# Patient Record
Sex: Male | Born: 1950
Health system: Southern US, Community
[De-identification: ages and names within clinical notes are randomized; demographics above are authoritative.]

## PROBLEM LIST (undated history)

## (undated) DIAGNOSIS — I499 Cardiac arrhythmia, unspecified: Secondary | ICD-10-CM

## (undated) DIAGNOSIS — M1990 Unspecified osteoarthritis, unspecified site: Secondary | ICD-10-CM

## (undated) DIAGNOSIS — I10 Essential (primary) hypertension: Secondary | ICD-10-CM

## (undated) DIAGNOSIS — Z8489 Family history of other specified conditions: Secondary | ICD-10-CM

## (undated) DIAGNOSIS — F419 Anxiety disorder, unspecified: Secondary | ICD-10-CM

## (undated) DIAGNOSIS — E119 Type 2 diabetes mellitus without complications: Secondary | ICD-10-CM

## (undated) DIAGNOSIS — N189 Chronic kidney disease, unspecified: Secondary | ICD-10-CM

## (undated) DIAGNOSIS — J189 Pneumonia, unspecified organism: Secondary | ICD-10-CM

## (undated) DIAGNOSIS — T8859XA Other complications of anesthesia, initial encounter: Secondary | ICD-10-CM

## (undated) DIAGNOSIS — F32A Depression, unspecified: Secondary | ICD-10-CM

## (undated) DIAGNOSIS — G709 Myoneural disorder, unspecified: Secondary | ICD-10-CM

## (undated) DIAGNOSIS — G4730 Sleep apnea, unspecified: Secondary | ICD-10-CM

## (undated) HISTORY — PX: TONSILLECTOMY: SUR1361

## (undated) HISTORY — PX: HIP PINNING: SHX1757

## (undated) HISTORY — PX: BACK SURGERY: SHX140

---

## 1995-07-20 HISTORY — PX: NASAL SEPTOPLASTY W/ TURBINOPLASTY: SHX2070

## 2006-07-19 HISTORY — PX: SHOULDER ARTHROSCOPY: SHX128

## 2007-07-20 HISTORY — PX: KNEE ARTHROSCOPY: SHX127

## 2008-07-19 HISTORY — PX: JOINT REPLACEMENT: SHX530

## 2018-03-03 DIAGNOSIS — E1142 Type 2 diabetes mellitus with diabetic polyneuropathy: Secondary | ICD-10-CM | POA: Diagnosis not present

## 2018-03-03 DIAGNOSIS — G4733 Obstructive sleep apnea (adult) (pediatric): Secondary | ICD-10-CM | POA: Diagnosis not present

## 2018-03-03 DIAGNOSIS — F418 Other specified anxiety disorders: Secondary | ICD-10-CM | POA: Diagnosis not present

## 2018-03-03 DIAGNOSIS — Z794 Long term (current) use of insulin: Secondary | ICD-10-CM | POA: Diagnosis not present

## 2018-03-03 DIAGNOSIS — E782 Mixed hyperlipidemia: Secondary | ICD-10-CM | POA: Diagnosis not present

## 2018-03-03 DIAGNOSIS — Z96651 Presence of right artificial knee joint: Secondary | ICD-10-CM | POA: Diagnosis not present

## 2018-03-03 DIAGNOSIS — M5136 Other intervertebral disc degeneration, lumbar region: Secondary | ICD-10-CM | POA: Diagnosis not present

## 2018-04-11 ENCOUNTER — Encounter: Payer: Self-pay | Admitting: Podiatry

## 2018-04-11 ENCOUNTER — Ambulatory Visit (INDEPENDENT_AMBULATORY_CARE_PROVIDER_SITE_OTHER): Payer: Medicare Other | Admitting: Podiatry

## 2018-04-11 DIAGNOSIS — B351 Tinea unguium: Secondary | ICD-10-CM | POA: Diagnosis not present

## 2018-04-11 DIAGNOSIS — E1149 Type 2 diabetes mellitus with other diabetic neurological complication: Secondary | ICD-10-CM | POA: Diagnosis not present

## 2018-04-11 DIAGNOSIS — M216X1 Other acquired deformities of right foot: Secondary | ICD-10-CM

## 2018-04-11 DIAGNOSIS — M79674 Pain in right toe(s): Secondary | ICD-10-CM

## 2018-04-11 DIAGNOSIS — M7741 Metatarsalgia, right foot: Secondary | ICD-10-CM | POA: Diagnosis not present

## 2018-04-11 DIAGNOSIS — M79675 Pain in left toe(s): Secondary | ICD-10-CM

## 2018-04-11 NOTE — Progress Notes (Signed)
   Subjective:    Patient ID: Jason Huang, male    DOB: [birth year], 67 y.o.   MRN: [id number]  HPI 67 year old male presents the office today for concerns of a possible callus of the ball of his right foot.  He states that he today he feels that he is stepping on something.  This is been ongoing for the last several weeks he denies any recent injury or trauma to the area denies any open sores.  Denies any swelling or redness.  [place] and he was seen a physician there for routine care and diabetic foot evaluation.  He states he was told he has a mild neuropathy.  His nails are elongated and cause discomfort inside shoes as well.  Denies any redness or drainage from the toenail sites.  No other concerns.   Review of Systems  All other systems reviewed and are negative.  History reviewed. No pertinent past medical history.  History reviewed. No pertinent surgical history.   Current Outpatient Medications:  .  Insulin Glargine (LANTUS SOLOSTAR Phelps), Inject into the skin., Disp: , Rfl:  .  trandolapril (MAVIK) 1 MG tablet, Take 1 mg by mouth daily., Disp: , Rfl:  .  escitalopram (LEXAPRO) 20 MG tablet, Take 20 mg by mouth daily., Disp: , Rfl: 1 .  glipiZIDE (GLUCOTROL) 10 MG tablet, Take 10 mg by mouth 2 (two) times daily., Disp: , Rfl: 4 .  oxyCODONE (ROXICODONE) 15 MG immediate release tablet, Take 15 mg by mouth 3 (three) times daily., Disp: , Rfl: 0 .  simvastatin (ZOCOR) 40 MG tablet, Take 40 mg by mouth daily., Disp: , Rfl: 4 .  TRULICITY 1.5 MG/0.5ML SOPN, AS DIRECTED ONCE A WEEK SUBCUTANEOUS, Disp: , Rfl: 10  Allergies  Allergen Reactions  . Celebrex [Celecoxib]   . Penicillins   . Sulfa Antibiotics   . Vioxx [Rofecoxib]          Objective:   Physical Exam  General: AAO x3, NAD  Dermatological: Nails are mildy hypertrophic, dystrophic, brittle, yellow discolored, elongated 10. No surrounding redness or drainage. Tenderness nails 1-5  bilaterally. No open lesions or pre-ulcerative lesions are identified today.  Vascular: Dorsalis Pedis artery and Posterior Tibial artery pedal pulses are 2/4 bilateral with immedate capillary fill time. There is no pain with calf compression, swelling, warmth, erythema.   Neruologic: Mild decreased with Dorann OuSimms Weinstein monofilament.  Musculoskeletal: Prominence of metatarsal heads plantarly with atrophy is identified.  There is minimal hyperkeratotic tissue submetatarsal 5 the majority of tenderness is along the sinus tarsi for which is prominent.  There is no area pinpoint tenderness there is no pain to vibratory sensation.  There is no edema, erythema.  Gait: Unassisted, Nonantalgic.        Assessment & Plan:   67 year old male with prominent metatarsal head, metatarsalgia; onychomycosis -Treatment options discussed including all alternatives, risks, and complications -Etiology of symptoms were discussed -I did debride the hyperkeratotic tissue submetatarsal 5 without any complications.  However the majority tenderness submetatarsal 4.  Dispensed metatarsal pads also dispensed a gel metatarsal offloading pad as well.  Discussed shoe modifications.  Also debrided the nails x10 without any complications or bleeding. -Follow-up in 6 months or sooner if any issues are to arise.   Vivi BarrackMatthew R Wagoner DPM

## 2018-04-19 DIAGNOSIS — M25512 Pain in left shoulder: Secondary | ICD-10-CM | POA: Diagnosis not present

## 2018-04-19 DIAGNOSIS — Z23 Encounter for immunization: Secondary | ICD-10-CM | POA: Diagnosis not present

## 2018-06-02 DIAGNOSIS — F5101 Primary insomnia: Secondary | ICD-10-CM | POA: Diagnosis not present

## 2018-06-02 DIAGNOSIS — G4733 Obstructive sleep apnea (adult) (pediatric): Secondary | ICD-10-CM | POA: Diagnosis not present

## 2018-06-02 DIAGNOSIS — E782 Mixed hyperlipidemia: Secondary | ICD-10-CM | POA: Diagnosis not present

## 2018-06-02 DIAGNOSIS — E1142 Type 2 diabetes mellitus with diabetic polyneuropathy: Secondary | ICD-10-CM | POA: Diagnosis not present

## 2018-06-02 DIAGNOSIS — M5136 Other intervertebral disc degeneration, lumbar region: Secondary | ICD-10-CM | POA: Diagnosis not present

## 2018-06-20 DIAGNOSIS — E1142 Type 2 diabetes mellitus with diabetic polyneuropathy: Secondary | ICD-10-CM | POA: Diagnosis not present

## 2018-06-20 DIAGNOSIS — Z7984 Long term (current) use of oral hypoglycemic drugs: Secondary | ICD-10-CM | POA: Diagnosis not present

## 2018-07-14 DIAGNOSIS — E1142 Type 2 diabetes mellitus with diabetic polyneuropathy: Secondary | ICD-10-CM | POA: Diagnosis not present

## 2018-07-14 DIAGNOSIS — N183 Chronic kidney disease, stage 3 (moderate): Secondary | ICD-10-CM | POA: Diagnosis not present

## 2018-07-14 DIAGNOSIS — F5101 Primary insomnia: Secondary | ICD-10-CM | POA: Diagnosis not present

## 2018-07-14 DIAGNOSIS — G4733 Obstructive sleep apnea (adult) (pediatric): Secondary | ICD-10-CM | POA: Diagnosis not present

## 2018-07-14 DIAGNOSIS — Z7984 Long term (current) use of oral hypoglycemic drugs: Secondary | ICD-10-CM | POA: Diagnosis not present

## 2018-08-11 DIAGNOSIS — F5101 Primary insomnia: Secondary | ICD-10-CM | POA: Diagnosis not present

## 2018-08-11 DIAGNOSIS — G4733 Obstructive sleep apnea (adult) (pediatric): Secondary | ICD-10-CM | POA: Diagnosis not present

## 2018-08-11 DIAGNOSIS — Z79899 Other long term (current) drug therapy: Secondary | ICD-10-CM | POA: Diagnosis not present

## 2018-08-11 DIAGNOSIS — Z7984 Long term (current) use of oral hypoglycemic drugs: Secondary | ICD-10-CM | POA: Diagnosis not present

## 2018-08-11 DIAGNOSIS — E1142 Type 2 diabetes mellitus with diabetic polyneuropathy: Secondary | ICD-10-CM | POA: Diagnosis not present

## 2018-08-11 DIAGNOSIS — E113291 Type 2 diabetes mellitus with mild nonproliferative diabetic retinopathy without macular edema, right eye: Secondary | ICD-10-CM | POA: Diagnosis not present

## 2018-09-01 DIAGNOSIS — E1142 Type 2 diabetes mellitus with diabetic polyneuropathy: Secondary | ICD-10-CM | POA: Diagnosis not present

## 2018-09-01 DIAGNOSIS — M5136 Other intervertebral disc degeneration, lumbar region: Secondary | ICD-10-CM | POA: Diagnosis not present

## 2018-09-01 DIAGNOSIS — E782 Mixed hyperlipidemia: Secondary | ICD-10-CM | POA: Diagnosis not present

## 2018-09-01 DIAGNOSIS — G4733 Obstructive sleep apnea (adult) (pediatric): Secondary | ICD-10-CM | POA: Diagnosis not present

## 2018-11-28 DIAGNOSIS — G63 Polyneuropathy in diseases classified elsewhere: Secondary | ICD-10-CM | POA: Diagnosis not present

## 2018-11-28 DIAGNOSIS — E1140 Type 2 diabetes mellitus with diabetic neuropathy, unspecified: Secondary | ICD-10-CM | POA: Diagnosis not present

## 2018-11-28 DIAGNOSIS — N529 Male erectile dysfunction, unspecified: Secondary | ICD-10-CM | POA: Diagnosis not present

## 2018-11-28 DIAGNOSIS — M5136 Other intervertebral disc degeneration, lumbar region: Secondary | ICD-10-CM | POA: Diagnosis not present

## 2018-11-28 DIAGNOSIS — Z794 Long term (current) use of insulin: Secondary | ICD-10-CM | POA: Diagnosis not present

## 2019-03-02 DIAGNOSIS — E1142 Type 2 diabetes mellitus with diabetic polyneuropathy: Secondary | ICD-10-CM | POA: Diagnosis not present

## 2019-03-02 DIAGNOSIS — N183 Chronic kidney disease, stage 3 (moderate): Secondary | ICD-10-CM | POA: Diagnosis not present

## 2019-03-02 DIAGNOSIS — F418 Other specified anxiety disorders: Secondary | ICD-10-CM | POA: Diagnosis not present

## 2019-03-02 DIAGNOSIS — E782 Mixed hyperlipidemia: Secondary | ICD-10-CM | POA: Diagnosis not present

## 2019-03-02 DIAGNOSIS — E669 Obesity, unspecified: Secondary | ICD-10-CM | POA: Diagnosis not present

## 2019-03-02 DIAGNOSIS — M5136 Other intervertebral disc degeneration, lumbar region: Secondary | ICD-10-CM | POA: Diagnosis not present

## 2019-03-30 DIAGNOSIS — E1142 Type 2 diabetes mellitus with diabetic polyneuropathy: Secondary | ICD-10-CM | POA: Diagnosis not present

## 2019-03-30 DIAGNOSIS — Z23 Encounter for immunization: Secondary | ICD-10-CM | POA: Diagnosis not present

## 2019-03-30 DIAGNOSIS — H6123 Impacted cerumen, bilateral: Secondary | ICD-10-CM | POA: Diagnosis not present

## 2019-04-23 DIAGNOSIS — H2512 Age-related nuclear cataract, left eye: Secondary | ICD-10-CM | POA: Diagnosis not present

## 2019-04-23 DIAGNOSIS — H04123 Dry eye syndrome of bilateral lacrimal glands: Secondary | ICD-10-CM | POA: Diagnosis not present

## 2019-04-23 DIAGNOSIS — H524 Presbyopia: Secondary | ICD-10-CM | POA: Diagnosis not present

## 2019-05-04 DIAGNOSIS — E1142 Type 2 diabetes mellitus with diabetic polyneuropathy: Secondary | ICD-10-CM | POA: Diagnosis not present

## 2019-09-05 DIAGNOSIS — G63 Polyneuropathy in diseases classified elsewhere: Secondary | ICD-10-CM | POA: Diagnosis not present

## 2019-09-05 DIAGNOSIS — M5136 Other intervertebral disc degeneration, lumbar region: Secondary | ICD-10-CM | POA: Diagnosis not present

## 2019-09-05 DIAGNOSIS — E1140 Type 2 diabetes mellitus with diabetic neuropathy, unspecified: Secondary | ICD-10-CM | POA: Diagnosis not present

## 2019-09-13 ENCOUNTER — Ambulatory Visit: Payer: Medicare Other | Attending: Internal Medicine

## 2019-09-13 DIAGNOSIS — Z23 Encounter for immunization: Secondary | ICD-10-CM | POA: Insufficient documentation

## 2019-09-13 NOTE — Progress Notes (Signed)
   Covid-19 Vaccination Clinic  Name:  Jason Huang    MRN: [id number] DOB: Jan 22, 1951  09/13/2019  Jason Huang was observed post Covid-19 immunization for 15 minutes without incidence. She was provided with Vaccine Information Sheet and instruction to access the V-Safe system.   Jason Huang was instructed to call 911 with any severe reactions post vaccine: Marland Kitchen Difficulty breathing  . Swelling of your face and throat  . A fast heartbeat  . A bad rash all over your body  . Dizziness and weakness    Immunizations Administered    Name Date Dose VIS Date Route   Pfizer COVID-19 Vaccine 09/13/2019  1:29 PM 0.3 mL 06/29/2019 Intramuscular   Manufacturer: [employer], [employer]   Lot: [id number]   NDC: [address]-7

## 2019-10-09 ENCOUNTER — Ambulatory Visit: Payer: Medicare Other | Attending: Internal Medicine

## 2019-10-09 DIAGNOSIS — Z23 Encounter for immunization: Secondary | ICD-10-CM

## 2019-10-09 NOTE — Progress Notes (Signed)
   Covid-19 Vaccination Clinic  Name:  Jason Huang    MRN: [id number] DOB: [birth year]  10/09/2019  [name] was observed post Covid-19 immunization for 30 minutes based on pre-vaccination screening without incident. He was provided with Vaccine Information Sheet and instruction to access the V-Safe system.   [name] was instructed to call 911 with any severe reactions post vaccine: Marland Kitchen Difficulty breathing  . Swelling of face and throat  . A fast heartbeat  . A bad rash all over body  . Dizziness and weakness   Immunizations Administered    Name Date Dose VIS Date Route   Pfizer COVID-19 Vaccine 10/09/2019  1:26 PM 0.3 mL 06/29/2019 Intramuscular   Manufacturer: [employer], [employer]   Lot: [id number]   NDC: [address]-4

## 2019-10-11 DIAGNOSIS — Z794 Long term (current) use of insulin: Secondary | ICD-10-CM | POA: Diagnosis not present

## 2019-10-11 DIAGNOSIS — I129 Hypertensive chronic kidney disease with stage 1 through stage 4 chronic kidney disease, or unspecified chronic kidney disease: Secondary | ICD-10-CM | POA: Insufficient documentation

## 2019-10-11 DIAGNOSIS — N289 Disorder of kidney and ureter, unspecified: Secondary | ICD-10-CM | POA: Diagnosis not present

## 2019-10-11 DIAGNOSIS — N1831 Chronic kidney disease, stage 3a: Secondary | ICD-10-CM | POA: Diagnosis not present

## 2019-10-11 DIAGNOSIS — E1121 Type 2 diabetes mellitus with diabetic nephropathy: Secondary | ICD-10-CM | POA: Diagnosis not present

## 2019-10-11 DIAGNOSIS — R801 Persistent proteinuria, unspecified: Secondary | ICD-10-CM | POA: Insufficient documentation

## 2019-10-11 DIAGNOSIS — Z6841 Body Mass Index (BMI) 40.0 and over, adult: Secondary | ICD-10-CM | POA: Diagnosis not present

## 2019-10-11 DIAGNOSIS — N183 Chronic kidney disease, stage 3 unspecified: Secondary | ICD-10-CM | POA: Insufficient documentation

## 2019-10-16 DIAGNOSIS — N1831 Chronic kidney disease, stage 3a: Secondary | ICD-10-CM | POA: Diagnosis not present

## 2019-10-16 DIAGNOSIS — N183 Chronic kidney disease, stage 3 unspecified: Secondary | ICD-10-CM | POA: Diagnosis not present

## 2019-11-30 DIAGNOSIS — E782 Mixed hyperlipidemia: Secondary | ICD-10-CM | POA: Diagnosis not present

## 2019-11-30 DIAGNOSIS — M5136 Other intervertebral disc degeneration, lumbar region: Secondary | ICD-10-CM | POA: Diagnosis not present

## 2019-11-30 DIAGNOSIS — N183 Chronic kidney disease, stage 3 unspecified: Secondary | ICD-10-CM | POA: Diagnosis not present

## 2019-11-30 DIAGNOSIS — E1140 Type 2 diabetes mellitus with diabetic neuropathy, unspecified: Secondary | ICD-10-CM | POA: Diagnosis not present

## 2020-02-12 DIAGNOSIS — N183 Chronic kidney disease, stage 3 unspecified: Secondary | ICD-10-CM | POA: Diagnosis not present

## 2020-02-12 DIAGNOSIS — M7062 Trochanteric bursitis, left hip: Secondary | ICD-10-CM | POA: Diagnosis not present

## 2020-02-12 DIAGNOSIS — E1142 Type 2 diabetes mellitus with diabetic polyneuropathy: Secondary | ICD-10-CM | POA: Diagnosis not present

## 2020-02-12 DIAGNOSIS — E1140 Type 2 diabetes mellitus with diabetic neuropathy, unspecified: Secondary | ICD-10-CM | POA: Diagnosis not present

## 2020-02-12 DIAGNOSIS — M25512 Pain in left shoulder: Secondary | ICD-10-CM | POA: Diagnosis not present

## 2020-02-19 DIAGNOSIS — M5136 Other intervertebral disc degeneration, lumbar region: Secondary | ICD-10-CM | POA: Diagnosis not present

## 2020-02-19 DIAGNOSIS — M7062 Trochanteric bursitis, left hip: Secondary | ICD-10-CM | POA: Diagnosis not present

## 2020-02-19 DIAGNOSIS — M25512 Pain in left shoulder: Secondary | ICD-10-CM | POA: Diagnosis not present

## 2020-02-19 DIAGNOSIS — N1831 Chronic kidney disease, stage 3a: Secondary | ICD-10-CM | POA: Diagnosis not present

## 2020-02-19 DIAGNOSIS — G8929 Other chronic pain: Secondary | ICD-10-CM | POA: Diagnosis not present

## 2020-02-19 DIAGNOSIS — E782 Mixed hyperlipidemia: Secondary | ICD-10-CM | POA: Diagnosis not present

## 2020-02-19 DIAGNOSIS — E1140 Type 2 diabetes mellitus with diabetic neuropathy, unspecified: Secondary | ICD-10-CM | POA: Diagnosis not present

## 2020-03-11 DIAGNOSIS — M25552 Pain in left hip: Secondary | ICD-10-CM | POA: Diagnosis not present

## 2020-03-25 DIAGNOSIS — E1142 Type 2 diabetes mellitus with diabetic polyneuropathy: Secondary | ICD-10-CM | POA: Diagnosis not present

## 2020-03-25 DIAGNOSIS — Z23 Encounter for immunization: Secondary | ICD-10-CM | POA: Diagnosis not present

## 2020-03-25 DIAGNOSIS — M25512 Pain in left shoulder: Secondary | ICD-10-CM | POA: Diagnosis not present

## 2020-03-25 DIAGNOSIS — M7062 Trochanteric bursitis, left hip: Secondary | ICD-10-CM | POA: Diagnosis not present

## 2020-03-25 DIAGNOSIS — N183 Chronic kidney disease, stage 3 unspecified: Secondary | ICD-10-CM | POA: Diagnosis not present

## 2020-03-25 DIAGNOSIS — Z79899 Other long term (current) drug therapy: Secondary | ICD-10-CM | POA: Diagnosis not present

## 2020-04-08 DIAGNOSIS — M25552 Pain in left hip: Secondary | ICD-10-CM | POA: Insufficient documentation

## 2020-04-14 DIAGNOSIS — M1612 Unilateral primary osteoarthritis, left hip: Secondary | ICD-10-CM | POA: Diagnosis not present

## 2020-04-14 DIAGNOSIS — M25552 Pain in left hip: Secondary | ICD-10-CM | POA: Diagnosis not present

## 2020-04-14 DIAGNOSIS — Z981 Arthrodesis status: Secondary | ICD-10-CM | POA: Insufficient documentation

## 2020-04-15 DIAGNOSIS — E1140 Type 2 diabetes mellitus with diabetic neuropathy, unspecified: Secondary | ICD-10-CM | POA: Diagnosis not present

## 2020-04-15 DIAGNOSIS — N183 Chronic kidney disease, stage 3 unspecified: Secondary | ICD-10-CM | POA: Diagnosis not present

## 2020-04-15 DIAGNOSIS — E782 Mixed hyperlipidemia: Secondary | ICD-10-CM | POA: Diagnosis not present

## 2020-04-15 DIAGNOSIS — Z7984 Long term (current) use of oral hypoglycemic drugs: Secondary | ICD-10-CM | POA: Diagnosis not present

## 2020-04-15 DIAGNOSIS — E1142 Type 2 diabetes mellitus with diabetic polyneuropathy: Secondary | ICD-10-CM | POA: Diagnosis not present

## 2020-04-15 DIAGNOSIS — Z794 Long term (current) use of insulin: Secondary | ICD-10-CM | POA: Diagnosis not present

## 2020-04-23 DIAGNOSIS — M25552 Pain in left hip: Secondary | ICD-10-CM | POA: Diagnosis not present

## 2020-04-25 DIAGNOSIS — E113292 Type 2 diabetes mellitus with mild nonproliferative diabetic retinopathy without macular edema, left eye: Secondary | ICD-10-CM | POA: Diagnosis not present

## 2020-04-25 DIAGNOSIS — H2512 Age-related nuclear cataract, left eye: Secondary | ICD-10-CM | POA: Diagnosis not present

## 2020-05-05 DIAGNOSIS — Z23 Encounter for immunization: Secondary | ICD-10-CM | POA: Diagnosis not present

## 2020-05-12 DIAGNOSIS — Z794 Long term (current) use of insulin: Secondary | ICD-10-CM | POA: Diagnosis not present

## 2020-05-12 DIAGNOSIS — N1832 Chronic kidney disease, stage 3b: Secondary | ICD-10-CM | POA: Diagnosis not present

## 2020-05-12 DIAGNOSIS — E0822 Diabetes mellitus due to underlying condition with diabetic chronic kidney disease: Secondary | ICD-10-CM | POA: Diagnosis not present

## 2020-05-16 DIAGNOSIS — E875 Hyperkalemia: Secondary | ICD-10-CM | POA: Diagnosis not present

## 2020-05-16 DIAGNOSIS — M9080 Osteopathy in diseases classified elsewhere, unspecified site: Secondary | ICD-10-CM | POA: Diagnosis not present

## 2020-05-16 DIAGNOSIS — R801 Persistent proteinuria, unspecified: Secondary | ICD-10-CM | POA: Diagnosis not present

## 2020-05-16 DIAGNOSIS — N1832 Chronic kidney disease, stage 3b: Secondary | ICD-10-CM | POA: Diagnosis not present

## 2020-05-16 DIAGNOSIS — Z794 Long term (current) use of insulin: Secondary | ICD-10-CM | POA: Diagnosis not present

## 2020-05-16 DIAGNOSIS — E1122 Type 2 diabetes mellitus with diabetic chronic kidney disease: Secondary | ICD-10-CM | POA: Diagnosis not present

## 2020-05-16 DIAGNOSIS — I129 Hypertensive chronic kidney disease with stage 1 through stage 4 chronic kidney disease, or unspecified chronic kidney disease: Secondary | ICD-10-CM | POA: Diagnosis not present

## 2020-05-16 DIAGNOSIS — E559 Vitamin D deficiency, unspecified: Secondary | ICD-10-CM | POA: Diagnosis not present

## 2020-05-16 DIAGNOSIS — E889 Metabolic disorder, unspecified: Secondary | ICD-10-CM | POA: Diagnosis not present

## 2020-05-16 DIAGNOSIS — M898X9 Other specified disorders of bone, unspecified site: Secondary | ICD-10-CM | POA: Insufficient documentation

## 2020-05-16 DIAGNOSIS — N183 Chronic kidney disease, stage 3 unspecified: Secondary | ICD-10-CM | POA: Diagnosis not present

## 2020-05-23 DIAGNOSIS — M1612 Unilateral primary osteoarthritis, left hip: Secondary | ICD-10-CM | POA: Diagnosis not present

## 2020-05-23 DIAGNOSIS — E782 Mixed hyperlipidemia: Secondary | ICD-10-CM | POA: Diagnosis not present

## 2020-05-23 DIAGNOSIS — E1142 Type 2 diabetes mellitus with diabetic polyneuropathy: Secondary | ICD-10-CM | POA: Diagnosis not present

## 2020-05-23 DIAGNOSIS — G4733 Obstructive sleep apnea (adult) (pediatric): Secondary | ICD-10-CM | POA: Diagnosis not present

## 2020-05-23 DIAGNOSIS — M5136 Other intervertebral disc degeneration, lumbar region: Secondary | ICD-10-CM | POA: Diagnosis not present

## 2020-05-23 DIAGNOSIS — N183 Chronic kidney disease, stage 3 unspecified: Secondary | ICD-10-CM | POA: Diagnosis not present

## 2020-05-29 DIAGNOSIS — M1612 Unilateral primary osteoarthritis, left hip: Secondary | ICD-10-CM | POA: Diagnosis not present

## 2020-05-30 DIAGNOSIS — M1612 Unilateral primary osteoarthritis, left hip: Secondary | ICD-10-CM | POA: Diagnosis not present

## 2020-05-30 DIAGNOSIS — Z01818 Encounter for other preprocedural examination: Secondary | ICD-10-CM | POA: Diagnosis not present

## 2020-06-17 DIAGNOSIS — M1612 Unilateral primary osteoarthritis, left hip: Secondary | ICD-10-CM | POA: Diagnosis not present

## 2020-06-17 DIAGNOSIS — N183 Chronic kidney disease, stage 3 unspecified: Secondary | ICD-10-CM | POA: Diagnosis not present

## 2020-06-17 DIAGNOSIS — E1140 Type 2 diabetes mellitus with diabetic neuropathy, unspecified: Secondary | ICD-10-CM | POA: Diagnosis not present

## 2020-06-17 DIAGNOSIS — E1142 Type 2 diabetes mellitus with diabetic polyneuropathy: Secondary | ICD-10-CM | POA: Diagnosis not present

## 2020-06-17 DIAGNOSIS — E782 Mixed hyperlipidemia: Secondary | ICD-10-CM | POA: Diagnosis not present

## 2020-06-17 DIAGNOSIS — G8929 Other chronic pain: Secondary | ICD-10-CM | POA: Diagnosis not present

## 2020-07-14 DIAGNOSIS — Z20822 Contact with and (suspected) exposure to covid-19: Secondary | ICD-10-CM | POA: Diagnosis not present

## 2020-07-21 DIAGNOSIS — M1612 Unilateral primary osteoarthritis, left hip: Secondary | ICD-10-CM | POA: Diagnosis present

## 2020-08-01 ENCOUNTER — Encounter (HOSPITAL_COMMUNITY): Payer: Self-pay

## 2020-08-01 ENCOUNTER — Other Ambulatory Visit: Payer: Self-pay

## 2020-08-01 NOTE — Progress Notes (Signed)
COVID Vaccine Completed:yes Date COVID Vaccine completed:09/2019- booster 03/2020 COVID vaccine manufacturer: Pfizer      PCP - Dr. Henrine Screws Cardiologist - no  Chest x-ray - no EKG - 08/11/20-epic, chart Stress Test - no ECHO - no Cardiac Cath - no Pacemaker/ICD device last checked:NA  Sleep Study - yes CPAP - yes  Fasting Blood Sugar - 84-105 Checks Blood Sugar _QD____ times a day  Blood Thinner Instructions:asa/ Dr. Abigail Miyamoto Aspirin Instructions:none Last Dose:  Anesthesia review:   Patient denies shortness of breath, fever, cough and chest pain at PAT appointment Yes  Patient verbalized understanding of instructions that were given to them at the PAT appointment. Patient was also instructed that they will need to review over the PAT instructions again at home before surgery.yes Pt has no SOB. He reports that he has a narrow airway.

## 2020-08-01 NOTE — Patient Instructions (Addendum)
DUE TO COVID-19 ONLY ONE VISITOR IS ALLOWED TO COME WITH YOU AND STAY IN THE WAITING ROOM ONLY DURING PRE OP AND PROCEDURE DAY OF SURGERY. THE 1 VISITOR  MAY VISIT WITH YOU AFTER SURGERY IN YOUR PRIVATE ROOM DURING VISITING HOURS ONLY!  YOU NEED TO HAVE A COVID 19 TEST ON_1/24______ @_12 :00______, THIS TEST MUST BE DONE BEFORE SURGERY,  COVID TESTING SITE [address] [place] , IT IS ON THE RIGHT GOING OUT [address] APPROXIMATELY  2 MINUTES PAST [employer] ON THE RIGHT. ONCE YOUR COVID TEST IS COMPLETED,  PLEASE BEGIN THE QUARANTINE INSTRUCTIONS AS OUTLINED IN YOUR HANDOUT.                [id number]    Your procedure is scheduled on: 08/13/20   Report to [facility] Main  Entrance   Report to Short Stay at 5:30 AM     Call this number if you have problems the morning of surgery [phone]   . BRUSH YOUR TEETH MORNING OF SURGERY AND RINSE YOUR MOUTH OUT, NO CHEWING GUM CANDY OR MINTS.   No food after midnight.    You may have clear liquid until 4:30 AM.    At 4:00 AM drink pre surgery drink.   Nothing by mouth after 4:30 AM.   Take these medicines the morning of surgery with A SIP OF WATER: Lexapro, gabapentin, Bring       How to Manage Your Diabetes Before and After Surgery  Why is it important to control my blood sugar before and after surgery? . Improving blood sugar levels before and after surgery helps healing and can limit problems. . A way of improving blood sugar control is eating a healthy diet by: o  Eating less sugar and carbohydrates o  Increasing activity/exercise o  Talking with your doctor about reaching your blood sugar goals . High blood sugars (greater than 180 mg/dL) can raise your risk of infections and slow your recovery, so you will need to focus on controlling your diabetes during the weeks before surgery. . Make sure that the doctor who takes care of your diabetes knows about your planned surgery  including the date and location.  How do I manage my blood sugar before surgery? . Check your blood sugar at least 4 times a day, starting 2 days before surgery, to make sure that the level is not too high or low. o Check your blood sugar the morning of your surgery when you wake up and every 2 hours until you get to the Short Stay unit. . If your blood sugar is less than 70 mg/dL, you will need to treat for low blood sugar: o Do not take insulin. o Treat a low blood sugar (less than 70 mg/dL) with  cup of clear juice (cranberry or apple), 4 glucose tablets, OR glucose gel. o Recheck blood sugar in 15 minutes after treatment (to make sure it is greater than 70 mg/dL). If your blood sugar is not greater than 70 mg/dL on recheck, call [phone] for further instructions. . Report your blood sugar to the short stay nurse when you get to Short Stay.  . If you are admitted to the hospital after surgery: o Your blood sugar will be checked by the staff and you will probably be given insulin after surgery (instead of oral diabetes medicines) to make sure you have good blood sugar levels. o The goal for blood sugar control after surgery is 80-180  mg/dL.   WHAT DO I DO ABOUT MY DIABETES MEDICATION?  Marland Kitchen. Do not take oral diabetes medicines (pills) the morning of surgery.  . THE NIGHT BEFORE SURGERY, take 20 units of Lantis  insulin.       . THE MORNING OF SURGERY, take 0  units of   insulin.  . The day of surgery, do not take other diabetes injectables, including Byetta (exenatide), Bydureon (exenatide ER), Victoza (liraglutide), or Trulicity (dulaglutide).                          You may not have any metal on your body including              piercings  Do not wear jewelry, , lotions, powders or deodorant                         Men may shave face and neck.   Do not bring valuables to the hospital. [place] IS NOT             RESPONSIBLE   FOR VALUABLES.  Contacts, dentures or bridgework  may not be worn into surgery.       Patients discharged the day of surgery will not be allowed to drive home.   IF YOU ARE HAVING SURGERY AND GOING HOME THE SAME DAY, YOU MUST HAVE AN ADULT TO DRIVE YOU HOME AND BE WITH YOU FOR 24 HOURS.   YOU MAY GO HOME BY TAXI OR UBER OR ORTHERWISE, BUT AN ADULT MUST ACCOMPANY YOU HOME AND STAY WITH YOU FOR 24 HOURS.  Name and phone number of your driver:  Special Instructions: N/A              Please read over the following fact sheets you were given: _____________________________________________________________________             [facility] - Preparing for [facility] Before surgery, you can play an important role.   Because skin is not sterile, your skin needs to be as free of germs as possible.   You can reduce the number of germs on your skin by washing with CHG (chlorahexidine gluconate) soap before surgery .  CHG is an antiseptic cleaner which kills germs and bonds with the skin to continue killing germs even after washing. Please DO NOT use if you have an allergy to CHG or antibacterial soaps.   If your skin becomes reddened/irritated stop using the CHG and inform your nurse when you arrive at Short Stay.  You may shave your face/neck.  Please follow these instructions carefully:  1.  Shower with CHG Soap the night before surgery and the  morning of Surgery.  2.  If you choose to wash your hair, wash your hair first as usual with your  normal  shampoo.  3.  After you shampoo, rinse your hair and body thoroughly to remove the  shampoo.                                        4.  Use CHG as you would any other liquid soap.  You can apply chg directly  to the skin and wash                       Gently with a scrungie or clean  washcloth.  5.  Apply the CHG Soap to your body ONLY FROM THE NECK DOWN.   Do not use on face/ open                           Wound or open sores. Avoid contact with eyes, ears mouth and genitals (private parts).                        Wash face,  Genitals (private parts) with your normal soap.             6.  Wash thoroughly, paying special attention to the area where your surgery  will be performed.  7.  Thoroughly rinse your body with warm water from the neck down.  8.  DO NOT shower/wash with your normal soap after using and rinsing off  the CHG Soap.             9.  Pat yourself dry with a clean towel.            10.  Wear clean pajamas.            11.  Place clean sheets on your bed the night of your first shower and do not  sleep with pets. Day of Surgery : Do not apply any lotions/deodorants the morning of surgery.  Please wear clean clothes to the hospital/surgery center.  FAILURE TO FOLLOW THESE INSTRUCTIONS MAY RESULT IN THE CANCELLATION OF YOUR SURGERY PATIENT SIGNATURE_________________________________  NURSE SIGNATURE__________________________________  ________________________________________________________________________   Jason Huang  An incentive spirometer is a tool that can help keep your lungs clear and active. This tool measures how well you are filling your lungs with each breath. Taking long deep breaths may help reverse or decrease the chance of developing breathing (pulmonary) problems (especially infection) following:  A long period of time when you are unable to move or be active. BEFORE THE PROCEDURE   If the spirometer includes an indicator to show your best effort, your nurse or respiratory therapist will set it to a desired goal.  If possible, sit up straight or lean slightly forward. Try not to slouch.  Hold the incentive spirometer in an upright position. INSTRUCTIONS FOR USE  1. Sit on the edge of your bed if possible, or sit up as far as you can in bed or on a chair. 2. Hold the incentive spirometer in an upright position. 3. Breathe out normally. 4. Place the mouthpiece in your mouth and seal your lips tightly around it. 5. Breathe in slowly and as deeply  as possible, raising the piston or the ball toward the top of the column. 6. Hold your breath for 3-5 seconds or for as long as possible. Allow the piston or ball to fall to the bottom of the column. 7. Remove the mouthpiece from your mouth and breathe out normally. 8. Rest for a few seconds and repeat Steps 1 through 7 at least 10 times every 1-2 hours when you are awake. Take your time and take a few normal breaths between deep breaths. 9. The spirometer may include an indicator to show your best effort. Use the indicator as a goal to work toward during each repetition. 10. After each set of 10 deep breaths, practice coughing to be sure your lungs are clear. If you have an incision (the cut made at the time of surgery), support your incision when coughing by placing a  pillow or rolled up towels firmly against it. Once you are able to get out of bed, walk around indoors and cough well. You may stop using the incentive spirometer when instructed by your caregiver.  RISKS AND COMPLICATIONS  Take your time so you do not get dizzy or light-headed.  If you are in pain, you may need to take or ask for pain medication before doing incentive spirometry. It is harder to take a deep breath if you are having pain. AFTER USE  Rest and breathe slowly and easily.  It can be helpful to keep track of a log of your progress. Your caregiver can provide you with a simple table to help with this. If you are using the spirometer at home, follow these instructions: SEEK MEDICAL CARE IF:   You are having difficultly using the spirometer.  You have trouble using the spirometer as often as instructed.  Your pain medication is not giving enough relief while using the spirometer.  You develop fever of 100.5 F (38.1 C) or higher. SEEK IMMEDIATE MEDICAL CARE IF:   You cough up bloody sputum that had not been present before.  You develop fever of 102 F (38.9 C) or greater.  You develop worsening pain at or  near the incision site. MAKE SURE YOU:   Understand these instructions.  Will watch your condition.  Will get help right away if you are not doing well or get worse. Document Released: 11/15/2006 Document Revised: 09/27/2011 Document Reviewed: 01/16/2007 [facility] Patient Information 2014 [employer], [place].   ________________________________________________________________________ No food after midnight.  You may have clear liquid until 4:30 AM.  At 4:30 AM drink pre surgery drink. Nothing by mouth after 4:30 AM.

## 2020-08-04 ENCOUNTER — Encounter (HOSPITAL_COMMUNITY)
Admission: RE | Admit: 2020-08-04 | Discharge: 2020-08-04 | Disposition: A | Discharge status: Home / Self Care | Payer: Medicare Other | Source: Ambulatory Visit

## 2020-08-04 HISTORY — DX: Other complications of anesthesia, initial encounter: T88.59XA

## 2020-08-04 HISTORY — DX: Type 2 diabetes mellitus without complications: E11.9

## 2020-08-04 HISTORY — DX: Unspecified osteoarthritis, unspecified site: M19.90

## 2020-08-04 HISTORY — DX: Depression, unspecified: F32.A

## 2020-08-04 HISTORY — DX: Anxiety disorder, unspecified: F41.9

## 2020-08-04 HISTORY — DX: Sleep apnea, unspecified: G47.30

## 2020-08-04 HISTORY — DX: Chronic kidney disease, unspecified: N18.9

## 2020-08-04 HISTORY — DX: Myoneural disorder, unspecified: G70.9

## 2020-08-04 HISTORY — DX: Family history of other specified conditions: Z84.89

## 2020-08-05 DIAGNOSIS — E782 Mixed hyperlipidemia: Secondary | ICD-10-CM | POA: Diagnosis not present

## 2020-08-05 DIAGNOSIS — Z01818 Encounter for other preprocedural examination: Secondary | ICD-10-CM | POA: Diagnosis not present

## 2020-08-05 DIAGNOSIS — M160 Bilateral primary osteoarthritis of hip: Secondary | ICD-10-CM | POA: Diagnosis not present

## 2020-08-05 DIAGNOSIS — G4733 Obstructive sleep apnea (adult) (pediatric): Secondary | ICD-10-CM | POA: Diagnosis not present

## 2020-08-05 DIAGNOSIS — E1140 Type 2 diabetes mellitus with diabetic neuropathy, unspecified: Secondary | ICD-10-CM | POA: Diagnosis not present

## 2020-08-05 DIAGNOSIS — Z794 Long term (current) use of insulin: Secondary | ICD-10-CM | POA: Diagnosis not present

## 2020-08-07 NOTE — H&P (Addendum)
TOTAL HIP ADMISSION H&P  Patient is admitted for left total hip arthroplasty with hardware removal.  Subjective:  Chief Complaint: Left hip pain  HPI: Jason Huang, 70 y.o. male, has a history of pain and functional disability in the left hip due to arthritis and patient has failed non-surgical conservative treatments for greater than 12 weeks to include corticosteriod injections and activity modification. Onset of symptoms was gradual, starting >10 years ago with gradually worsening course since that time. The patient noted prior procedures of the hip to include previous pinning due to SCFE on the left hip. Patient currently rates pain in the left hip at 7 out of 10 with activity. Patient has night pain, worsening of pain with activity and weight bearing, trendelenberg gait and crepitus. 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 This condition presents safety issues increasing the risk of falls. There is no current active infection.  There are no problems to display for this patient.   Past Medical History:  Diagnosis Date  . Anxiety   . Arthritis    back, wrists elbows knees  . Chronic kidney disease    stage 3  . Complication of anesthesia    narrow airway  . Depression   . Diabetes mellitus without complication (HCC)   . Family history of adverse reaction to anesthesia    slow to wake up  . Neuromuscular disorder (HCC)    neuropathy in feet  . Sleep apnea     Past Surgical History:  Procedure Laterality Date  . BACK SURGERY    . HIP PINNING Bilateral [id number]  . JOINT REPLACEMENT Right 2010  . KNEE ARTHROSCOPY Right 2009  . NASAL SEPTOPLASTY W/ TURBINOPLASTY  1997  . SHOULDER ARTHROSCOPY Right 2008  . TONSILLECTOMY     as a child    Prior to Admission medications   Medication Sig Start Date End Date Taking? Authorizing Provider  acetaminophen (TYLENOL) 500  MG tablet Take 500 mg by mouth every 6 (six) hours as needed for headache.   Yes [provider]  aspirin EC 81 MG tablet Take 81 mg by mouth daily. Swallow whole.   Yes [provider]  cholecalciferol (VITAMIN D3) 25 MCG (1000 UNIT) tablet Take 1,000 Units by mouth daily.   Yes [provider]  escitalopram (LEXAPRO) 20 MG tablet Take 20 mg by mouth daily. 03/13/18  Yes [provider]  gabapentin (NEURONTIN) 100 MG capsule Take 2 capsules by mouth 4 (four) times daily. 06/30/20  Yes [provider]  glipiZIDE (GLUCOTROL) 10 MG tablet Take 10 mg by mouth 2 (two) times daily. 03/28/18  Yes [provider]  insulin glargine (LANTUS) 100 UNIT/ML Solostar Pen Inject 40 Units into the skin at bedtime.   Yes [provider]  Melatonin 10 MG TABS Take 10 mg by mouth at bedtime.   Yes [provider]  Multiple Vitamins-Minerals (MULTIVITAMIN WITH MINERALS) tablet Take 1 tablet by mouth daily.   Yes [provider]  oxyCODONE (ROXICODONE) 15 MG immediate release tablet Take 15 mg by mouth 3 (three) times daily. 03/10/18  Yes [provider]  pioglitazone (ACTOS) 30 MG tablet Take 30 mg by mouth daily. 06/07/20  Yes [provider]  simvastatin (ZOCOR) 40 MG tablet Take 40 mg by mouth daily. 03/28/18  Yes [provider]  trandolapril (MAVIK) 1 MG tablet Take 1 mg by mouth daily.  Yes [provider]  traZODone (DESYREL) 100 MG tablet Take 100 mg by mouth at bedtime. 07/19/20  Yes [provider]  TRULICITY 1.5 MG/0.5ML SOPN Inject 1.5 mg into the muscle every Tuesday. 03/28/18  Yes [provider]  vitamin B-12 (CYANOCOBALAMIN) 1000 MCG tablet Take 1,000 mcg by mouth daily.   Yes [provider]    Allergies  Allergen Reactions  . Penicillins Anaphylaxis  . Sulfa Antibiotics Rash  . Celebrex [Celecoxib] Rash    Headache   . Vioxx [Rofecoxib] Rash    Social  History   Socioeconomic History  . Marital status: Unknown    Spouse name: Not on file  . Number of children: Not on file  . Years of education: Not on file  . Highest education level: Not on file  Occupational History  . Not on file  Tobacco Use  . Smoking status: Former Smoker    Packs/day: 0.50    Years: 15.00    Pack years: 7.50    Types: Cigarettes    Quit date: 2000    Years since quitting: 22.0  . Smokeless tobacco: Never Used  Vaping Use  . Vaping Use: Never used  Substance and Sexual Activity  . Alcohol use: Never  . Drug use: Never  . Sexual activity: Not on file  Other Topics Concern  . Not on file  Social History Narrative  . Not on file   Social Determinants of Health   Financial Resource Strain: Not on file  Food Insecurity: Not on file  Transportation Needs: Not on file  Physical Activity: Not on file  Stress: Not on file  Social Connections: Not on file  Intimate Partner Violence: Not on file    Tobacco Use: Medium Risk  . Smoking Tobacco Use: Former Smoker  . Smokeless Tobacco Use: Never Used   Social History   Substance and Sexual Activity  Alcohol Use Never    No family history on file.  Review of Systems  Constitutional: Negative for chills and fever.  HENT: Negative for congestion, sore throat and tinnitus.   Eyes: Negative for double vision, photophobia and pain.  Respiratory: Negative for cough, shortness of breath and wheezing.   Cardiovascular: Negative for chest pain, palpitations and orthopnea.  Gastrointestinal: Negative for heartburn, nausea and vomiting.  Genitourinary: Negative for dysuria, frequency and urgency.  Musculoskeletal: Positive for joint pain.  Neurological: Negative for dizziness, weakness and headaches.     Objective:  Physical Exam: Well nourished and well developed.  General: Alert and oriented x3, cooperative and pleasant, no acute distress.  Head: normocephalic, atraumatic, neck supple.  Eyes:  EOMI.  Respiratory: breath sounds clear in all fields, no wheezing, rales, or rhonchi. Cardiovascular: Regular rate and rhythm, no murmurs, gallops or rubs.  Abdomen: non-tender to palpation and soft, normoactive bowel sounds. Musculoskeletal:  Left Hip Exam:  The range of motion: Flexion to 100 degrees, internal rotation to 0 degrees, external rotation to 20 degrees, and abduction to 20 degrees.  There is no tenderness over the greater trochanteric bursa.  There is no pain on provocative testing of the hip.   Calves soft and nontender. Motor function intact in LE. Strength 5/5 LE bilaterally. Neuro: Distal pulses 2+. Sensation to light touch intact in LE.  Imaging Review Plain radiographs demonstrate severe degenerative joint disease of the left hip. The bone quality appears to be adequate for age and reported activity level.  Assessment/Plan:  End stage arthritis, left hip with retained hardware  The patient history, physical examination, clinical judgement of the provider and imaging studies are consistent with end stage degenerative joint disease of the left hip and total hip arthroplasty is deemed medically necessary. The treatment options including medical management, injection therapy, arthroscopy and arthroplasty were discussed at length. The risks and benefits of total hip arthroplasty were presented and reviewed. The risks due to aseptic loosening, infection, stiffness, dislocation/subluxation, thromboembolic complications and other imponderables were discussed. The patient acknowledged the explanation, agreed to proceed with the plan and consent was signed. Patient is being admitted for inpatient treatment for surgery, pain control, PT, OT, prophylactic antibiotics, VTE prophylaxis, progressive ambulation and ADLs and discharge planning.The patient is planning to be discharged home.   Patient's anticipated LOS is less than 2 midnights, meeting these requirements: - Lives within 1  hour of care - Has a competent adult at home to recover with post-op recover - NO history of  - Diabetes  - Coronary Artery Disease  - Heart failure  - Heart attack  - Stroke  - DVT/VTE  - Cardiac arrhythmia  - Respiratory Failure/COPD  - Renal failure  - Anemia  - Advanced Liver disease  Therapy Plans: HEP Disposition: Home with wife Planned DVT Prophylaxis: Aspirin 325 mg BID DME Needed: None PCP: Henrine Screws, MD (clearance received) TXA: IV Allergies: PCN (anaphylaxis), sulfa, celebrex (rash) Anesthesia Concerns: Sleep apnea BMI: 38 Last HgbA1c: 6.7% (05/2020) Pharmacy: [facility]  Other: - Discussed dilaudid for postoperative pain management - Oxycodone 15 mg TID currently for chronic back issues - May require general anesthesia due to extensive lumbar surgery  - Patient was instructed on what medications to stop prior to surgery. - Follow-up visit in 2 weeks with Dr. Lequita Halt - Begin physical therapy following surgery - Pre-operative lab work as pre-surgical testing - Prescriptions will be provided in hospital at time of discharge  Arther Abbott, PA-C Orthopedic Surgery [facility] [place]

## 2020-08-11 ENCOUNTER — Encounter (HOSPITAL_COMMUNITY): Payer: Self-pay | Admitting: Physician Assistant

## 2020-08-11 ENCOUNTER — Encounter (HOSPITAL_COMMUNITY)
Admission: RE | Admit: 2020-08-11 | Discharge: 2020-08-11 | Disposition: A | Discharge status: Home / Self Care | Payer: Medicare Other | Source: Ambulatory Visit | Attending: Orthopedic Surgery | Admitting: Orthopedic Surgery

## 2020-08-11 ENCOUNTER — Other Ambulatory Visit: Payer: Self-pay

## 2020-08-11 ENCOUNTER — Other Ambulatory Visit (HOSPITAL_COMMUNITY)
Admission: RE | Admit: 2020-08-11 | Discharge: 2020-08-11 | Disposition: A | Discharge status: Home / Self Care | Payer: Medicare Other | Source: Ambulatory Visit | Attending: Orthopedic Surgery | Admitting: Orthopedic Surgery

## 2020-08-11 DIAGNOSIS — Z01818 Encounter for other preprocedural examination: Secondary | ICD-10-CM | POA: Diagnosis not present

## 2020-08-11 DIAGNOSIS — E119 Type 2 diabetes mellitus without complications: Secondary | ICD-10-CM | POA: Insufficient documentation

## 2020-08-11 DIAGNOSIS — Z20822 Contact with and (suspected) exposure to covid-19: Secondary | ICD-10-CM | POA: Diagnosis not present

## 2020-08-11 LAB — COMPREHENSIVE METABOLIC PANEL
ALT: 14 U/L (ref 0–44)
AST: 13 U/L — ABNORMAL LOW (ref 15–41)
Albumin: 3.7 g/dL (ref 3.5–5.0)
Alkaline Phosphatase: 50 U/L (ref 38–126)
Anion gap: 9 (ref 5–15)
BUN: 36 mg/dL — ABNORMAL HIGH (ref 8–23)
CO2: 27 mmol/L (ref 22–32)
Calcium: 9.2 mg/dL (ref 8.9–10.3)
Chloride: 103 mmol/L (ref 98–111)
Creatinine, Ser: 1.57 mg/dL — ABNORMAL HIGH (ref 0.61–1.24)
GFR, Estimated: 47 mL/min — ABNORMAL LOW (ref >60)
Glucose, Bld: 249 mg/dL — ABNORMAL HIGH (ref 70–99)
Potassium: 5.2 mmol/L — ABNORMAL HIGH (ref 3.5–5.1)
Sodium: 139 mmol/L (ref 135–145)
Total Bilirubin: 0.4 mg/dL (ref 0.3–1.2)
Total Protein: 6.4 g/dL — ABNORMAL LOW (ref 6.5–8.1)

## 2020-08-11 LAB — CBC
HCT: 38.6 % — ABNORMAL LOW (ref 39.0–52.0)
Hemoglobin: 12.2 g/dL — ABNORMAL LOW (ref 13.0–17.0)
MCH: 29.8 pg (ref 26.0–34.0)
MCHC: 31.6 g/dL (ref 30.0–36.0)
MCV: 94.1 fL (ref 80.0–100.0)
Platelets: 147 10*3/uL — ABNORMAL LOW (ref 150–400)
RBC: 4.1 MIL/uL — ABNORMAL LOW (ref 4.22–5.81)
RDW: 13.2 % (ref 11.5–15.5)
WBC: 4.7 10*3/uL (ref 4.0–10.5)
nRBC: 0 % (ref 0.0–0.2)

## 2020-08-11 LAB — PROTIME-INR
INR: 1 (ref 0.8–1.2)
Prothrombin Time: 13 seconds (ref 11.4–15.2)

## 2020-08-11 LAB — APTT: aPTT: 32 seconds (ref 24–36)

## 2020-08-11 LAB — SURGICAL PCR SCREEN
MRSA, PCR: NEGATIVE
Staphylococcus aureus: NEGATIVE

## 2020-08-11 LAB — HEMOGLOBIN A1C
Hgb A1c MFr Bld: 5.8 % — ABNORMAL HIGH (ref 4.8–5.6)
Mean Plasma Glucose: 119.76 mg/dL

## 2020-08-12 DIAGNOSIS — Z01818 Encounter for other preprocedural examination: Secondary | ICD-10-CM | POA: Diagnosis not present

## 2020-08-12 DIAGNOSIS — M160 Bilateral primary osteoarthritis of hip: Secondary | ICD-10-CM | POA: Diagnosis not present

## 2020-08-12 LAB — SARS CORONAVIRUS 2 (TAT 6-24 HRS): SARS Coronavirus 2: NEGATIVE

## 2020-08-12 NOTE — Progress Notes (Signed)
Anesthesia Chart Review   Case: [id number] Date/Time: 08/13/20 0715   Procedures:      TOTAL HIP ARTHROPLASTY ANTERIOR APPROACH (Left Hip) -     HARDWARE REMOVAL (N/A )   Anesthesia type: Choice   Pre-op diagnosis: Left hip osteoarthritis,RETAINED HARDWARE   Location: [facility] ROOM 10 / [facility]   Surgeons: Ollen Gross, MD      DISCUSSION:70 y.o. former smoker with h/o sleep apnea, CKD Stage III, DM II, left hip OA, retained hardware scheduled for above procedure 08/13/2020 with Dr. Ollen Gross.   EKG done on 08/05/20 faxed from PCP office which shows atrial fibrillation, rate 74 bpm.  No documentation of a-fib in chart.  I have requested previous EKG and OV notes.  Voicemail left with PCP nurse to discuss.  If new onset a-fib pt will need cardio evaluation.   Spoke with PCP, Dr. Abigail Miyamoto, to discuss EKG findings.  He reports the pt has no previous h/o a-fib and agrees with cardiac evaluation prior to left hip surgery due to new onset a-fib.   Voicemail left with Dr. Deri Fuelling office.  VS: Ht 5' 7.5" (1.715 m)   Wt 108.9 kg   BMI 37.03 kg/m   PROVIDERS: Henrine Screws, MD is PCP   Lisette Abu, MD is Nephrologist last seen 05/16/20 LABS: Labs reviewed: Acceptable for surgery. (all labs ordered are listed, but only abnormal results are displayed)  Labs Reviewed  CBC - Abnormal; Notable for the following components:      Result Value   RBC 4.10 (*)    Hemoglobin 12.2 (*)    HCT 38.6 (*)    Platelets 147 (*)    All other components within normal limits  COMPREHENSIVE METABOLIC PANEL - Abnormal; Notable for the following components:   Potassium 5.2 (*)    Glucose, Bld 249 (*)    BUN 36 (*)    Creatinine, Ser 1.57 (*)    Total Protein 6.4 (*)    AST 13 (*)    GFR, Estimated 47 (*)    All other components within normal limits  HEMOGLOBIN A1C - Abnormal; Notable for the following components:   Hgb A1c MFr Bld 5.8 (*)    All other components within normal limits   SURGICAL PCR SCREEN  PROTIME-INR  APTT  TYPE AND SCREEN     IMAGES:   EKG:   CV:  Past Medical History:  Diagnosis Date  . Anxiety   . Arthritis    back, wrists elbows knees  . Chronic kidney disease    stage 3  . Complication of anesthesia    narrow airway  . Depression   . Diabetes mellitus without complication (HCC)   . Family history of adverse reaction to anesthesia    slow to wake up  . Neuromuscular disorder (HCC)    neuropathy in feet  . Sleep apnea     Past Surgical History:  Procedure Laterality Date  . BACK SURGERY    . HIP PINNING Bilateral [id number]  . JOINT REPLACEMENT Right 2010  . KNEE ARTHROSCOPY Right 2009  . NASAL SEPTOPLASTY W/ TURBINOPLASTY  1997  . SHOULDER ARTHROSCOPY Right 2008  . TONSILLECTOMY     as a child    MEDICATIONS: . acetaminophen (TYLENOL) 500 MG tablet  . aspirin EC 81 MG tablet  . cholecalciferol (VITAMIN D3) 25 MCG (1000 UNIT) tablet  . escitalopram (LEXAPRO) 20 MG tablet  . gabapentin (NEURONTIN) 100 MG capsule  . glipiZIDE (GLUCOTROL) 10 MG tablet  .  insulin glargine (LANTUS) 100 UNIT/ML Solostar Pen  . Melatonin 10 MG TABS  . Multiple Vitamins-Minerals (MULTIVITAMIN WITH MINERALS) tablet  . oxyCODONE (ROXICODONE) 15 MG immediate release tablet  . pioglitazone (ACTOS) 30 MG tablet  . simvastatin (ZOCOR) 40 MG tablet  . trandolapril (MAVIK) 1 MG tablet  . traZODone (DESYREL) 100 MG tablet  . TRULICITY 1.5 MG/0.5ML SOPN  . vitamin B-12 (CYANOCOBALAMIN) 1000 MCG tablet   No current facility-administered medications for this encounter.    Jodell Cipro, PA-C [facility] [phone]

## 2020-08-13 ENCOUNTER — Inpatient Hospital Stay (HOSPITAL_COMMUNITY): Admission: RE | Admit: 2020-08-13 | Payer: Medicare Other | Source: Home / Self Care | Admitting: Orthopedic Surgery

## 2020-08-13 ENCOUNTER — Encounter (HOSPITAL_COMMUNITY): Admission: RE | Payer: Self-pay | Source: Home / Self Care

## 2020-08-13 LAB — TYPE AND SCREEN
ABO/RH(D): O POS
Antibody Screen: NEGATIVE

## 2020-08-13 SURGERY — ARTHROPLASTY, HIP, TOTAL, ANTERIOR APPROACH
Anesthesia: Choice | Site: Hip

## 2020-08-18 DIAGNOSIS — M1612 Unilateral primary osteoarthritis, left hip: Secondary | ICD-10-CM | POA: Diagnosis not present

## 2020-08-18 DIAGNOSIS — G8929 Other chronic pain: Secondary | ICD-10-CM | POA: Diagnosis not present

## 2020-08-18 DIAGNOSIS — E1142 Type 2 diabetes mellitus with diabetic polyneuropathy: Secondary | ICD-10-CM | POA: Diagnosis not present

## 2020-08-18 DIAGNOSIS — E1140 Type 2 diabetes mellitus with diabetic neuropathy, unspecified: Secondary | ICD-10-CM | POA: Diagnosis not present

## 2020-08-18 DIAGNOSIS — E782 Mixed hyperlipidemia: Secondary | ICD-10-CM | POA: Diagnosis not present

## 2020-08-18 DIAGNOSIS — N183 Chronic kidney disease, stage 3 unspecified: Secondary | ICD-10-CM | POA: Diagnosis not present

## 2020-08-18 DIAGNOSIS — I4891 Unspecified atrial fibrillation: Secondary | ICD-10-CM | POA: Diagnosis not present

## 2020-08-20 ENCOUNTER — Encounter: Payer: Self-pay | Admitting: Cardiology

## 2020-08-20 ENCOUNTER — Other Ambulatory Visit: Payer: Self-pay

## 2020-08-20 ENCOUNTER — Ambulatory Visit: Payer: Medicare Other | Admitting: Cardiology

## 2020-08-20 VITALS — BP 134/65 | HR 67 | Temp 98.6°F | Resp 16 | Ht 67.0 in | Wt 250.0 lb

## 2020-08-20 DIAGNOSIS — Z01810 Encounter for preprocedural cardiovascular examination: Secondary | ICD-10-CM

## 2020-08-20 DIAGNOSIS — I739 Peripheral vascular disease, unspecified: Secondary | ICD-10-CM | POA: Diagnosis not present

## 2020-08-20 DIAGNOSIS — N1832 Chronic kidney disease, stage 3b: Secondary | ICD-10-CM | POA: Diagnosis not present

## 2020-08-20 DIAGNOSIS — I10 Essential (primary) hypertension: Secondary | ICD-10-CM

## 2020-08-20 DIAGNOSIS — I471 Supraventricular tachycardia: Secondary | ICD-10-CM

## 2020-08-20 DIAGNOSIS — Z6841 Body Mass Index (BMI) 40.0 and over, adult: Secondary | ICD-10-CM | POA: Diagnosis not present

## 2020-08-20 DIAGNOSIS — Z794 Long term (current) use of insulin: Secondary | ICD-10-CM

## 2020-08-20 DIAGNOSIS — E1122 Type 2 diabetes mellitus with diabetic chronic kidney disease: Secondary | ICD-10-CM | POA: Diagnosis not present

## 2020-08-20 MED ORDER — METOPROLOL TARTRATE 25 MG PO TABS: 25.0000 mg | ORAL_TABLET | Freq: Two times a day (BID) | ORAL | 2 refills | Status: DC

## 2020-08-20 NOTE — Progress Notes (Signed)
Primary Physician/Referring:  Aura Dials, MD  Patient ID: Jason Huang, male    DOB: 1950/11/28, 70 y.o.   MRN: [id number]  Chief Complaint  Patient presents with  . Atrial Fibrillation  . New Patient (Initial Visit)    Ref by Dr. Aura Dials   HPI:    Ranny Wiebelhaus  is a 70 y.o. Caucasian male patient referred to me for evaluation of atrial fibrillation.  Past medical history significant for degenerative joint disease, chronic back pain with extensive lumbar surgery in the remote past, diabetes mellitus with chronic stage III kidney disease with hyperkalemia and follows nephrology,  OSA on CPAP and peripheral neuropathy.  He was also needs preoperative cardiac work-up as he is scheduled for left hip arthroplasty.  Surgery was canceled as the EKG in the outpatient basis had suggested atrial fibrillation.  He now presents for risk stratification and evaluation of possible A. fib.  Patient is markedly sedentary in view of severe chronic back pain, has had multiple major back surgery in the past.  Also his activity recently has been markedly limited due to severe pain in his hip and found to have severe DJD with bone-on-bone and was scheduled for left hip arthroplasty.  He also has cramping in his legs bilaterally with activity and also occasionally at night.  He does have tingling and numbness in his feet as well.  Denies dyspnea or shortness of breath or palpitations but admits to being very sedentary.  Accompanied by his wife at the bedside.  Past Medical History:  Diagnosis Date  . Anxiety   . Arthritis    back, wrists elbows knees  . Chronic kidney disease    stage 3  . Complication of anesthesia    narrow airway  . Depression   . Diabetes mellitus without complication ([place])   . Family history of adverse reaction to anesthesia    slow to wake up  . Neuromuscular disorder (HCC)    neuropathy in feet  . Sleep apnea    Past Surgical History:  Procedure Laterality  Date  . BACK SURGERY    . HIP PINNING Bilateral [id number]  . JOINT REPLACEMENT Right 2010  . KNEE ARTHROSCOPY Right 2009  . NASAL SEPTOPLASTY W/ TURBINOPLASTY  1997  . SHOULDER ARTHROSCOPY Right 2008  . TONSILLECTOMY     as a child   Family History  Problem Relation Age of Onset  . Colon cancer Mother   . Non-Hodgkin's lymphoma Father   . Colon cancer Brother     Social History   Tobacco Use  . Smoking status: Former Smoker    Packs/day: 0.50    Years: 15.00    Pack years: 7.50    Types: Cigarettes    Quit date: 2000    Years since quitting: 22.1  . Smokeless tobacco: Never Used  Substance Use Topics  . Alcohol use: Never   Marital Status: Unknown  ROS  Review of Systems  Cardiovascular: Negative for chest pain, dyspnea on exertion and leg swelling.  Respiratory: Positive for snoring (OSA on CPAP).   Musculoskeletal: Positive for arthritis, back pain (major back surgery and now walks with cane), joint pain (left hip > right and bilateral knee) and muscle cramps.  Gastrointestinal: Negative for melena.   Objective  Blood pressure 134/65, pulse 67, temperature 98.6 F (37 C), temperature source Temporal, resp. rate 16, height 5' 7"  (1.702 m), weight 250 lb (113.4 kg), SpO2 95 %.  Vitals with BMI 08/20/2020 08/01/2020  Height  5' 7"  5' 7.5"  Weight 250 lbs 240 lbs  BMI 38.18 29.93  Systolic 716 -  Diastolic 65 -  Pulse 67 -     Physical Exam Constitutional:      Comments: Morbidly obese  Cardiovascular:     Rate and Rhythm: Regular rhythm. Frequent extrasystoles are present.    Pulses:          Carotid pulses are 2+ on the right side and 2+ on the left side.      Femoral pulses are 1+ on the right side with bruit and 0 on the left side with bruit.      Popliteal pulses are 0 on the right side and 0 on the left side.       Dorsalis pedis pulses are 0 on the right side and 0 on the left side.       Posterior tibial pulses are 0 on the right side and 0 on the left  side.     Heart sounds: Normal heart sounds. No murmur heard. No gallop.      Comments: There is no JVD, no pedal edema.  Chronic venous stasis dermatitis changes noted.  Mild superficial varicose veins noted bilaterally. Musculoskeletal:     Cervical back: Normal range of motion.  Neurological:     Mental Status: He is alert.    Laboratory examination:   Recent Labs    08/11/20 1115  NA 139  K 5.2*  CL 103  CO2 27  GLUCOSE 249*  BUN 36*  CREATININE 1.57*  CALCIUM 9.2  GFRNONAA 47*   estimated creatinine clearance is 53.4 mL/min (A) (by C-G formula based on SCr of 1.57 mg/dL (H)).  CMP Latest Ref Rng & Units 08/11/2020  Glucose 70 - 99 mg/dL 249(H)  BUN 8 - 23 mg/dL 36(H)  Creatinine 0.61 - 1.24 mg/dL 1.57(H)  Sodium 135 - 145 mmol/L 139  Potassium 3.5 - 5.1 mmol/L 5.2(H)  Chloride 98 - 111 mmol/L 103  CO2 22 - 32 mmol/L 27  Calcium 8.9 - 10.3 mg/dL 9.2  Total Protein 6.5 - 8.1 g/dL 6.4(L)  Total Bilirubin 0.3 - 1.2 mg/dL 0.4  Alkaline Phos 38 - 126 U/L 50  AST 15 - 41 U/L 13(L)  ALT 0 - 44 U/L 14   CBC Latest Ref Rng & Units 08/11/2020  WBC 4.0 - 10.5 K/uL 4.7  Hemoglobin 13.0 - 17.0 g/dL 12.2(L)  Hematocrit 39.0 - 52.0 % 38.6(L)  Platelets 150 - 400 K/uL 147(L)    Lipid Panel No results for input(s): CHOL, TRIG, LDLCALC, VLDL, HDL, CHOLHDL, LDLDIRECT in the last 8760 hours.  HEMOGLOBIN A1C Lab Results  Component Value Date   HGBA1C 5.8 (H) 08/11/2020   MPG 119.76 08/11/2020   TSH No results for input(s): TSH in the last 8760 hours.  External labs:     Cholesterol, total 115.000 m 11/30/2019 HDL 42.000 mg 11/30/2019 LDL 44.000 mg 11/30/2019 Triglycerides 176.000 m 11/30/2019  A1C 5.800 % 08/11/2020   Labs 05/12/2020:  Vitamin D 42, intact PTH normal at 36.  Sodium 139, potassium 4.8, BUN 32, creatinine 1.66, EGFR 41 mL.  Hb 12.5/HCT 37.9, platelets 154.  Medications and allergies   Allergies  Allergen Reactions  . Penicillins Anaphylaxis   . Sulfa Antibiotics Rash  . Celebrex [Celecoxib] Rash    Headache   . Vioxx [Rofecoxib] Rash     Outpatient Medications Prior to Visit  Medication Sig Dispense Refill  . acetaminophen (TYLENOL) 500 MG tablet  Take 500 mg by mouth every 6 (six) hours as needed for headache.    Marland Kitchen aspirin EC 81 MG tablet Take 81 mg by mouth daily. Swallow whole.    . cholecalciferol (VITAMIN D3) 25 MCG (1000 UNIT) tablet Take 1,000 Units by mouth daily.    Marland Kitchen docusate sodium (COLACE) 100 MG capsule Take 100 mg by mouth daily as needed for mild constipation.    Marland Kitchen escitalopram (LEXAPRO) 20 MG tablet Take 20 mg by mouth daily.  1  . furosemide (LASIX) 20 MG tablet Take 20 mg by mouth daily as needed.    . gabapentin (NEURONTIN) 100 MG capsule Take 2 capsules by mouth 4 (four) times daily.    Marland Kitchen glipiZIDE (GLUCOTROL) 10 MG tablet Take 10 mg by mouth 2 (two) times daily.  4  . insulin glargine (LANTUS) 100 UNIT/ML Solostar Pen Inject 40 Units into the skin at bedtime.    . lidocaine (LIDODERM) 5 % Lidoderm 5 % topical patch  APPLY 1 PATCH BY TOPICAL ROUTE ONCE DAILY (MAY WEAR UP TO 12HOURS.)    . Melatonin 10 MG TABS Take 10 mg by mouth at bedtime.    . Multiple Vitamins-Minerals (MULTIVITAMIN WITH MINERALS) tablet Take 1 tablet by mouth daily.    Marland Kitchen oxyCODONE (ROXICODONE) 15 MG immediate release tablet Take 15 mg by mouth 3 (three) times daily.  0  . pioglitazone (ACTOS) 30 MG tablet Take 30 mg by mouth daily.    . simvastatin (ZOCOR) 40 MG tablet Take 40 mg by mouth daily.  4  . trandolapril (MAVIK) 1 MG tablet Take 1 mg by mouth daily.    . traZODone (DESYREL) 100 MG tablet Take 100 mg by mouth at bedtime.    . TRULICITY 1.5 TT/0.1XB SOPN Inject 1.5 mg into the muscle every Tuesday.  10  . vitamin B-12 (CYANOCOBALAMIN) 1000 MCG tablet Take 1,000 mcg by mouth daily.    . vitamin C (ASCORBIC ACID) 250 MG tablet Take 250 mg by mouth daily.     No facility-administered medications prior to visit.    Radiology:    No results found.  Cardiac Studies:   None EKG:     EKG, 08/20/2020: Sinus rhythm with first-degree block at rate of 74 bpm, normal axis, incomplete right bundle branch block.  No evidence of ischemia, normal QT interval.  Atrial couplets (1).    EKG 08/12/2020: Normal sinus rhythm scratch that sinus rhythm with first-degree AV block at rate of 80 bpm, rightward axis, incomplete right bundle branch block.  7 beat atrial tachycardia.  No evidence of atrial fibrillation.  Assessment     ICD-10-CM   1. Preoperative cardiovascular examination  Z01.810 PCV ECHOCARDIOGRAM COMPLETE    PCV MYOCARDIAL PERFUSION WITH LEXISCAN  2. Type 2 diabetes mellitus with stage 3b chronic kidney disease, with long-term current use of insulin (HCC)  E11.22 PCV MYOCARDIAL PERFUSION WITH LEXISCAN   N18.32    Z79.4   3. Primary hypertension  I10 PCV ECHOCARDIOGRAM COMPLETE    PCV MYOCARDIAL PERFUSION WITH LEXISCAN  4. Atrial tachycardia (HCC)  I47.1 EKG 12-Lead  5. Class 3 severe obesity due to excess calories with serious comorbidity and body mass index (BMI) of 40.0 to 44.9 in adult (HCC)  E66.01    Z68.41   6. PAD (peripheral artery disease) (HCC)  I73.9 PCV LOWER ARTERIAL (BILATERAL)     There are no discontinued medications.  No orders of the defined types were placed in this encounter.  Orders Placed  This Encounter  Procedures  . PCV MYOCARDIAL PERFUSION WITH LEXISCAN    Standing Status:   Future    Standing Expiration Date:   10/18/2020  . EKG 12-Lead  . PCV ECHOCARDIOGRAM COMPLETE    Standing Status:   Future    Standing Expiration Date:   08/20/2021    Recommendations:   Jeovani Weisenburger is a 70 y.o. Caucasian male patient referred to me for evaluation of atrial fibrillation.  Past medical history significant for degenerative joint disease, chronic back pain with extensive lumbar surgery in the remote past, diabetes mellitus with chronic stage III kidney disease with hyperkalemia and follows  nephrology,  OSA on CPAP and peripheral neuropathy.  He was also needs preoperative cardiac work-up as he is scheduled for left hip arthroplasty. Patient was scheduled for surgery but was canceled due to an EKG revealing possible atrial fibrillation.  Review of the EKG reveals brief atrial tachycardia but no atrial fibrillation.  He is certainly at risk for A. fib in view of hypertension, obesity, diabetes mellitus and obstructive sleep apnea.  I will start him on metoprolol tartrate 25 mg twice daily as a prophylaxis for now.  I had a very long discussion with the patient regarding weight loss and he appears to be motivated.  His wife is present at the bedside.  Patient needs cardiac recertification, he has not had any cardiac work-up in >10 years.  He has chronic renal insufficiency and peripheral arterial disease by physical exam as well.  He is overall clinically high risk from cardiac standpoint.  He also needs an echocardiogram to evaluate for left atrial size and also LV systolic function.  Obtain Lower extremity arterial duplex/ABI for evaluation of PAD and or claudication. Office visit following the work-up/investigations.  I spent 60 minutes with the patient and his wife and evaluation of external labs, personal read on EKG and counseling for weight loss and medication use and coordination of care.   Adrian Prows, MD, [facility] 08/20/2020, 12:18 PM Office: [phone]

## 2020-08-25 ENCOUNTER — Other Ambulatory Visit: Payer: Self-pay

## 2020-08-25 ENCOUNTER — Ambulatory Visit: Payer: Medicare Other

## 2020-08-25 DIAGNOSIS — Z794 Long term (current) use of insulin: Secondary | ICD-10-CM | POA: Diagnosis not present

## 2020-08-25 DIAGNOSIS — Z01810 Encounter for preprocedural cardiovascular examination: Secondary | ICD-10-CM | POA: Diagnosis not present

## 2020-08-25 DIAGNOSIS — I10 Essential (primary) hypertension: Secondary | ICD-10-CM

## 2020-08-25 DIAGNOSIS — N1832 Long term (current) use of insulin: Secondary | ICD-10-CM

## 2020-08-25 DIAGNOSIS — E1122 Type 2 diabetes mellitus with diabetic chronic kidney disease: Secondary | ICD-10-CM | POA: Diagnosis not present

## 2020-09-05 ENCOUNTER — Ambulatory Visit: Payer: Medicare Other

## 2020-09-05 ENCOUNTER — Other Ambulatory Visit: Payer: Self-pay

## 2020-09-05 DIAGNOSIS — I739 Peripheral vascular disease, unspecified: Secondary | ICD-10-CM

## 2020-09-05 DIAGNOSIS — Z01810 Encounter for preprocedural cardiovascular examination: Secondary | ICD-10-CM

## 2020-09-05 DIAGNOSIS — I10 Essential (primary) hypertension: Secondary | ICD-10-CM | POA: Diagnosis not present

## 2020-09-12 ENCOUNTER — Ambulatory Visit: Payer: Medicare Other | Admitting: Cardiology

## 2020-09-12 ENCOUNTER — Other Ambulatory Visit: Payer: Self-pay

## 2020-09-12 ENCOUNTER — Encounter: Payer: Self-pay | Admitting: Cardiology

## 2020-09-12 VITALS — BP 122/60 | HR 77 | Temp 98.2°F | Resp 16 | Ht 67.0 in | Wt 247.2 lb

## 2020-09-12 DIAGNOSIS — I10 Essential (primary) hypertension: Secondary | ICD-10-CM | POA: Diagnosis not present

## 2020-09-12 DIAGNOSIS — I480 Paroxysmal atrial fibrillation: Secondary | ICD-10-CM | POA: Diagnosis not present

## 2020-09-12 DIAGNOSIS — I739 Peripheral vascular disease, unspecified: Secondary | ICD-10-CM | POA: Diagnosis not present

## 2020-09-12 DIAGNOSIS — Z01810 Encounter for preprocedural cardiovascular examination: Secondary | ICD-10-CM

## 2020-09-12 MED ORDER — RIVAROXABAN 15 MG PO TABS: 15.0000 mg | ORAL_TABLET | Freq: Every day | ORAL | 6 refills | Status: DC

## 2020-09-12 NOTE — Progress Notes (Signed)
Primary Physician/Referring:  Aura Dials, MD  Patient ID: Jason Huang, male    DOB: [birth year], 70 y.o.   MRN: [id number]  Chief Complaint  Patient presents with  . Atrial Tachycardia  . Pre-op Exam  . Follow-up    3 week   HPI:    Jason Huang  is a 70 y.o. Caucasian male patient referred to me for evaluation of atrial fibrillation.  Past medical history significant for degenerative joint disease, chronic back pain with extensive lumbar surgery in the remote past, diabetes mellitus with chronic stage III kidney disease with hyperkalemia and follows nephrology,  OSA on CPAP and peripheral neuropathy.  He was also needs preoperative cardiac work-up as he is scheduled for left hip arthroplasty.  Surgery was canceled as the EKG in the outpatient basis had suggested atrial fibrillation.    Patient is markedly sedentary in view of severe chronic back pain, has had multiple major back surgery in the past.  Also his activity recently has been markedly limited due to severe pain in his hip and found to have severe DJD with bone-on-bone and was scheduled for left hip arthroplasty.  He also has cramping in his legs bilaterally with activity and also occasionally at night.  He does have tingling and numbness in his feet as well.  Denies dyspnea or shortness of breath or palpitations but admits to being very sedentary.  Accompanied by his wife at the bedside. He underwent cardiac testing and presents for F/U.  He has lost about 3 to 4 pounds in weight since last office visit 3 weeks ago and states that he has made significant lifestyle changes.  No new symptomatology.  Past Medical History:  Diagnosis Date  . Anxiety   . Arthritis    back, wrists elbows knees  . Chronic kidney disease    stage 3  . Complication of anesthesia    narrow airway  . Depression   . Diabetes mellitus without complication ([place])   . Family history of adverse reaction to anesthesia    slow to wake up  .  Neuromuscular disorder (HCC)    neuropathy in feet  . Sleep apnea    Past Surgical History:  Procedure Laterality Date  . BACK SURGERY    . HIP PINNING Bilateral [id number]  . JOINT REPLACEMENT Right 2010  . KNEE ARTHROSCOPY Right 2009  . NASAL SEPTOPLASTY W/ TURBINOPLASTY  1997  . SHOULDER ARTHROSCOPY Right 2008  . TONSILLECTOMY     as a child   Family History  Problem Relation Age of Onset  . Colon cancer Mother   . Non-Hodgkin's lymphoma Father   . Colon cancer Brother     Social History   Tobacco Use  . Smoking status: Former Smoker    Packs/day: 0.50    Years: 15.00    Pack years: 7.50    Types: Cigarettes    Quit date: 2000    Years since quitting: 22.1  . Smokeless tobacco: Never Used  Substance Use Topics  . Alcohol use: Never   Marital Status: Unknown  ROS  Review of Systems  Cardiovascular: Negative for chest pain, dyspnea on exertion and leg swelling.  Respiratory: Positive for snoring (OSA on CPAP).   Musculoskeletal: Positive for arthritis, back pain (major back surgery and now walks with cane), joint pain (left hip > right and bilateral knee) and muscle cramps.  Gastrointestinal: Negative for melena.   Objective  Blood pressure 122/60, pulse 77, temperature 98.2 F (36.8 C), temperature  source Temporal, resp. rate 16, height 5' 7"  (1.702 m), weight 247 lb 3.2 oz (112.1 kg), SpO2 94 %.  Vitals with BMI 09/12/2020 08/26/2020 08/20/2020  Height 5' 7"  - 5' 7"   Weight 247 lbs 3 oz - 250 lbs  BMI 78.93 - 81.01  Systolic 751 025 852  Diastolic 60 82 65  Pulse 77 57 67     Physical Exam Constitutional:      Comments: Morbidly obese  Cardiovascular:     Rate and Rhythm: Regular rhythm. Frequent extrasystoles are present.    Pulses:          Carotid pulses are 2+ on the right side and 2+ on the left side.      Femoral pulses are 1+ on the right side with bruit and 0 on the left side with bruit.      Popliteal pulses are 0 on the right side and 0 on the left  side.       Dorsalis pedis pulses are 0 on the right side and 0 on the left side.       Posterior tibial pulses are 0 on the right side and 0 on the left side.     Heart sounds: Normal heart sounds. No murmur heard. No gallop.      Comments: There is no JVD, no pedal edema.  Chronic venous stasis dermatitis changes noted.  Mild superficial varicose veins noted bilaterally. Musculoskeletal:     Cervical back: Normal range of motion.  Neurological:     Mental Status: He is alert.    Laboratory examination:   Recent Labs    08/11/20 1115  NA 139  K 5.2*  CL 103  CO2 27  GLUCOSE 249*  BUN 36*  CREATININE 1.57*  CALCIUM 9.2  GFRNONAA 47*   CrCl cannot be calculated (Patient's most recent lab result is older than the maximum 21 days allowed.).  CMP Latest Ref Rng & Units 08/11/2020  Glucose 70 - 99 mg/dL 249(H)  BUN 8 - 23 mg/dL 36(H)  Creatinine 0.61 - 1.24 mg/dL 1.57(H)  Sodium 135 - 145 mmol/L 139  Potassium 3.5 - 5.1 mmol/L 5.2(H)  Chloride 98 - 111 mmol/L 103  CO2 22 - 32 mmol/L 27  Calcium 8.9 - 10.3 mg/dL 9.2  Total Protein 6.5 - 8.1 g/dL 6.4(L)  Total Bilirubin 0.3 - 1.2 mg/dL 0.4  Alkaline Phos 38 - 126 U/L 50  AST 15 - 41 U/L 13(L)  ALT 0 - 44 U/L 14   CBC Latest Ref Rng & Units 08/11/2020  WBC 4.0 - 10.5 K/uL 4.7  Hemoglobin 13.0 - 17.0 g/dL 12.2(L)  Hematocrit 39.0 - 52.0 % 38.6(L)  Platelets 150 - 400 K/uL 147(L)    Lipid Panel No results for input(s): CHOL, TRIG, LDLCALC, VLDL, HDL, CHOLHDL, LDLDIRECT in the last 8760 hours.  HEMOGLOBIN A1C Lab Results  Component Value Date   HGBA1C 5.8 (H) 08/11/2020   MPG 119.76 08/11/2020   TSH No results for input(s): TSH in the last 8760 hours.  External labs:     Cholesterol, total 115.000 m 11/30/2019 HDL 42.000 mg 11/30/2019 LDL 44.000 mg 11/30/2019 Triglycerides 176.000 m 11/30/2019  A1C 5.800 % 08/11/2020   Labs 05/12/2020:  Vitamin D 42, intact PTH normal at 36.  Sodium 139, potassium 4.8, BUN  32, creatinine 1.66, EGFR 41 mL.  Hb 12.5/HCT 37.9, platelets 154.  Medications and allergies   Allergies  Allergen Reactions  . Penicillins Anaphylaxis  . Sulfa Antibiotics  Rash  . Celebrex [Celecoxib] Rash    Headache   . Vioxx [Rofecoxib] Rash     Outpatient Medications Prior to Visit  Medication Sig Dispense Refill  . acetaminophen (TYLENOL) 500 MG tablet Take 500 mg by mouth every 6 (six) hours as needed for headache.    . cholecalciferol (VITAMIN D3) 25 MCG (1000 UNIT) tablet Take 1,000 Units by mouth daily.    Marland Kitchen docusate sodium (COLACE) 100 MG capsule Take 100 mg by mouth daily as needed for mild constipation.    Marland Kitchen escitalopram (LEXAPRO) 20 MG tablet Take 20 mg by mouth daily.  1  . furosemide (LASIX) 20 MG tablet Take 20 mg by mouth daily as needed.    . gabapentin (NEURONTIN) 100 MG capsule Take 2 capsules by mouth 4 (four) times daily.    Marland Kitchen glipiZIDE (GLUCOTROL) 10 MG tablet Take 10 mg by mouth 2 (two) times daily.  4  . insulin glargine (LANTUS) 100 UNIT/ML Solostar Pen Inject 40 Units into the skin at bedtime.    . lidocaine (LIDODERM) 5 % Lidoderm 5 % topical patch  APPLY 1 PATCH BY TOPICAL ROUTE ONCE DAILY (MAY WEAR UP TO 12HOURS.)    . Melatonin 10 MG TABS Take 10 mg by mouth at bedtime.    . metoprolol tartrate (LOPRESSOR) 25 MG tablet Take 1 tablet (25 mg total) by mouth 2 (two) times daily. 60 tablet 2  . Multiple Vitamins-Minerals (MULTIVITAMIN WITH MINERALS) tablet Take 1 tablet by mouth daily.    Marland Kitchen oxyCODONE (ROXICODONE) 15 MG immediate release tablet Take 15 mg by mouth 3 (three) times daily.  0  . pioglitazone (ACTOS) 30 MG tablet Take 30 mg by mouth daily.    . simvastatin (ZOCOR) 40 MG tablet Take 40 mg by mouth daily.  4  . trandolapril (MAVIK) 1 MG tablet Take 1 mg by mouth daily.    . traZODone (DESYREL) 100 MG tablet Take 100 mg by mouth at bedtime.    . TRULICITY 1.5 BM/8.4XL SOPN Inject 1.5 mg into the muscle every Tuesday.  10  . vitamin B-12  (CYANOCOBALAMIN) 1000 MCG tablet Take 1,000 mcg by mouth daily.    . vitamin C (ASCORBIC ACID) 250 MG tablet Take 250 mg by mouth daily.    Marland Kitchen aspirin EC 81 MG tablet Take 81 mg by mouth daily. Swallow whole.     No facility-administered medications prior to visit.    Radiology:   No results found.  Cardiac Studies:   PCV MYOCARDIAL PERFUSION WITH LEXISCAN 08/25/2020 Lexiscan nuclear stress test performed using 1-day protocol. Stress EKG is non-diagnostic, as this is pharmacological stress test. In addition, rest and stress EKG showed atrial fibrillation with controlled ventricular rate. Normal myocardial perfusion. Stress LVEF 72%. Low risk study.  PCV ECHOCARDIOGRAM COMPLETE 24/40/1027 Normal LV systolic function with visual EF 55-60%. Left ventricle cavity is normal in size. Mild left ventricular hypertrophy. Normal global wall motion. Indeterminate diastolic filling pattern, elevated LAP. No significant valvular heart disease. No prior study for comparison.   Lower Extremity Arterial Duplex 09/04/2020:  Significant elevation of the peak systolic velocity is seen in the EIA and CFA suggestive of >50% stenosis. There is marked heterogeneous plaque noted. The right PT appears occluded. Remaining right lower extremity does not suggest a hemodynamically significant stenosis.  No significant elevation of the peak systolic velocity is seen in the left lower extremity to suggest a hemodynamically significant stenosis.  This exam reveals mildly decreased perfusion of the right lower  extremity, noted at the anterior tibial artery level (ABI 0.86) and mildly decreased perfusion of the left lower extremity, noted at the anterior tibal and post tibial artery level (ABI 0.86).   EKG:   EKG 09/12/2020: Atrial fibrillation with controlled ventricular response at rate of 64 bpm, left axis deviation, left anterior fascicular block.  Incomplete right bundle branch block.  Low-voltage complexes.  Normal  QT interval.  Compared to 08/20/2020, sinus with first-degree AV block.  And compared to 08/12/2020, 7 beat atrial tachycardia with underlying sinus with first-degree AV block.  Assessment     ICD-10-CM   1. Preoperative cardiovascular examination Left 10/08/2020 Jason Birch, MD  Z01.810   2. Paroxysmal atrial fibrillation (HCC)  I48.0 EKG 12-Lead  3. Primary hypertension  I10   4. PAD (peripheral artery disease) (HCC)  I73.9     CHA2DS2-VASc Score is 4.  Yearly risk of stroke: 4.8% (A, DM, HTN, Vasc Dz).  Score of 1=0.6; 2=2.2; 3=3.2; 4=4.8; 5=7.2; 6=9.8; 7=>9.8) -(CHF; HTN; vasc disease DM,  Male = 1; Age <65 =0; 65-74 = 1,  >75 =2; stroke/embolism= 2).    Medications Discontinued During This Encounter  Medication Reason  . aspirin EC 81 MG tablet Change in therapy    Meds ordered this encounter  Medications  . Rivaroxaban (XARELTO) 15 MG TABS tablet    Sig: Take 1 tablet (15 mg total) by mouth daily with supper.    Dispense:  30 tablet    Refill:  6   Orders Placed This Encounter  Procedures  . EKG 12-Lead   Recommendations:   Jason Huang is a 70 y.o. Caucasian male patient with past medical history significant for DM,  diabetes mellitus with chronic stage III kidney disease with hyperkalemia and follows nephrology, OSA on CPAP and peripheral neuropathy. PAD, degenerative joint disease, chronic back pain with extensive lumbar surgery in the remote past presents for preop evaluation as he is scheduled for left hip arthroplasty.  His EKG today does indeed reveal atrial fibrillation.  He may have paroxysmal atrial fibrillation versus persistent atrial fibrillation.  He has significant cardiovascular risk factors for A. fib including obesity, sleep apnea and age along with hypertension.  I have started the patient on Xarelto 15 mg in the evening after dinner.  Patient is aware to hold Xarelto for 2 days prior to his surgery for left hip arthroplasty.  I have discontinued  aspirin to reduce bleeding risk.  Fortunately echocardiogram reveals preserved LVEF and no evidence of ischemia/low risk.  Hence he can undergo surgery with low risk for cardiac complications. BP is well controlled.  He is on a statin and lipids are controlled.  He has severe peripheral arterial disease and is symptomatic but his symptoms of leg pain are multifactorial in etiology including pseudoclaudication from spinal stenosis, sciatica, degenerative joint disease as well.  But his symptoms of right hip pain do suggest claudication as well.  However this can wait for further evaluation after he recovers from left hip arthroplasty.  I will see him back in 2 months for follow-up.  I will consider either a external event monitoring if he is in persistent atrial fibrillation on his next office visit.  If persistent, will also consider direct-current cardioversion.  We will also consider peripheral arteriogram.  I have sent the letter for preoperative cardiac stratification to Dr. Gaynelle Arabian.  I spent 40 minutes with the patient and his wife with regard to discussions regarding bleeding risks associated with Xarelto, discussions regarding  peripheral arterial disease, discussions regarding medical management versus invasive strategy.   Jason Prows, MD, [facility] 09/14/2020, 2:38 PM Office: [phone] Pager: [phone]

## 2020-09-14 ENCOUNTER — Encounter: Payer: Self-pay | Admitting: Cardiology

## 2020-09-26 NOTE — H&P (Signed)
TOTAL HIP ADMISSION H&P  Patient is admitted for left total hip arthroplasty.  Subjective:  Chief Complaint: Left hip pain  HPI: Jason Huang, 70 y.o. male, has a history of pain and functional disability in the left hip due to arthritis and patient has failed non-surgical conservative treatments for greater than 12 weeks to include use of assistive devices and activity modification. Onset of symptoms was gradual, starting several years ago with gradually worsening course since that time. The patient noted prior procedures of the hip to include pinning on the left hip. Patient currently rates pain in the left hip at 8 out of 10 with activity. Patient has worsening of pain with activity and weight bearing, pain that interfers with activities of daily living and crepitus. 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 This condition presents safety issues increasing the risk of falls. There is no current active infection.  There are no problems to display for this patient.   Past Medical History:  Diagnosis Date  . Anxiety   . Arthritis    back, wrists elbows knees  . Chronic kidney disease    stage 3  . Complication of anesthesia    narrow airway  . Depression   . Diabetes mellitus without complication (HCC)   . Family history of adverse reaction to anesthesia    slow to wake up  . Neuromuscular disorder (HCC)    neuropathy in feet  . Sleep apnea     Past Surgical History:  Procedure Laterality Date  . BACK SURGERY    . HIP PINNING Bilateral [id number]  . JOINT REPLACEMENT Right 2010  . KNEE ARTHROSCOPY Right 2009  . NASAL SEPTOPLASTY W/ TURBINOPLASTY  1997  . SHOULDER ARTHROSCOPY Right 2008  . TONSILLECTOMY     as a child    Prior to Admission medications   Medication Sig Start Date End Date Taking? Authorizing Provider  acetaminophen (TYLENOL) 500 MG tablet Take 500  mg by mouth every 6 (six) hours as needed for headache.   Yes [provider]  cholecalciferol (VITAMIN D3) 25 MCG (1000 UNIT) tablet Take 1,000 Units by mouth daily.   Yes [provider]  docusate sodium (COLACE) 100 MG capsule Take 100-200 mg by mouth daily as needed for mild constipation.   Yes [provider]  escitalopram (LEXAPRO) 20 MG tablet Take 20 mg by mouth daily. 03/13/18  Yes [provider]  furosemide (LASIX) 20 MG tablet Take 20 mg by mouth daily as needed for fluid.   Yes [provider]  gabapentin (NEURONTIN) 100 MG capsule Take 200 mg by mouth 4 (four) times daily. 06/30/20  Yes [provider]  glipiZIDE (GLUCOTROL) 10 MG tablet Take 10 mg by mouth 2 (two) times daily. 03/28/18  Yes [provider]  insulin glargine (LANTUS) 100 UNIT/ML Solostar Pen Inject 40 Units into the skin at bedtime.   Yes [provider]  lidocaine (LIDODERM) 5 % Place 1 patch onto the skin daily as needed (pain).   Yes [provider]  Melatonin 10 MG TABS Take 10 mg by mouth at bedtime.   Yes [provider]  metoprolol tartrate (LOPRESSOR) 25 MG tablet Take 1 tablet (25 mg total) by mouth 2 (two) times daily. 08/20/20 11/18/20 Yes Yates Decamp, MD  Multiple Vitamins-Minerals (MULTIVITAMIN WITH MINERALS) tablet Take 1 tablet by mouth daily.   Yes [provider]  oxyCODONE (ROXICODONE) 15 MG immediate release tablet Take 15 mg by mouth 3 (three) times daily. 03/10/18  Yes [provider]  pioglitazone (ACTOS) 30 MG tablet Take 30 mg by mouth daily. 06/07/20  Yes [provider]  Polyethyl Glycol-Propyl Glycol (SYSTANE OP) Place 1 drop into both eyes 2 (two) times daily as needed (dry eyes).   Yes [provider]  Rivaroxaban (XARELTO) 15 MG TABS tablet Take 1 tablet (15 mg total) by mouth daily with supper. 09/12/20  Yes Yates Decamp, MD  simvastatin (ZOCOR) 40 MG tablet Take 40 mg by  mouth daily. 03/28/18  Yes [provider]  trandolapril (MAVIK) 1 MG tablet Take 1 mg by mouth daily.   Yes [provider]  traZODone (DESYREL) 100 MG tablet Take 100 mg by mouth at bedtime. 07/19/20  Yes [provider]  TRULICITY 1.5 MG/0.5ML SOPN Inject 1.5 mg into the muscle every Tuesday. 03/28/18  Yes [provider]  vitamin B-12 (CYANOCOBALAMIN) 1000 MCG tablet Take 1,000 mcg by mouth daily.   Yes [provider]  vitamin C (ASCORBIC ACID) 250 MG tablet Take 250 mg by mouth daily.   Yes [provider]    Allergies  Allergen Reactions  . Penicillins Anaphylaxis  . Sulfa Antibiotics Rash  . Celebrex [Celecoxib] Rash    Headache   . Vioxx [Rofecoxib] Rash    Social History   Socioeconomic History  . Marital status: Married    Spouse name: Not on file  . Number of children: 1  . Years of education: Not on file  . Highest education level: Not on file  Occupational History  . Not on file  Tobacco Use  . Smoking status: Former Smoker    Packs/day: 0.50    Years: 15.00    Pack years: 7.50    Types: Cigarettes    Quit date: 2000    Years since quitting: 22.2  . Smokeless tobacco: Never Used  Vaping Use  . Vaping Use: Never used  Substance and Sexual Activity  . Alcohol use: Never  . Drug use: Never  . Sexual activity: Not on file  Other Topics Concern  . Not on file  Social History Narrative  . Not on file   Social Determinants of Health   Financial Resource Strain: Not on file  Food Insecurity: Not on file  Transportation Needs: Not on file  Physical Activity: Not on file  Stress: Not on file  Social Connections: Not on file  Intimate Partner Violence: Not on file    Tobacco Use: Medium Risk  . Smoking Tobacco Use: Former Smoker  . Smokeless Tobacco Use: Never Used   Social History   Substance and Sexual Activity  Alcohol Use Never    Family History  Problem Relation Age of Onset  . Colon cancer  Mother   . Non-Hodgkin's lymphoma Father   . Colon cancer Brother     Review of Systems  Constitutional: Negative for chills and fever.  HENT: Negative for congestion, sore throat and tinnitus.   Eyes: Negative for double vision, photophobia and pain.  Respiratory: Negative for cough, shortness of breath and wheezing.   Cardiovascular: Negative for chest pain, palpitations and orthopnea.  Gastrointestinal: Negative for heartburn, nausea and vomiting.  Genitourinary: Negative for dysuria, frequency and urgency.  Musculoskeletal: Positive for joint pain.  Neurological: Negative for dizziness, weakness and headaches.    Objective:  Physical Exam: Well nourished and well developed.  General: Alert and oriented x3,  cooperative and pleasant, no acute distress.  Head: normocephalic, atraumatic, neck supple.  Eyes: EOMI.  Respiratory: breath sounds clear in all fields, no wheezing, rales, or rhonchi. Cardiovascular: Regular rate and rhythm, no murmurs, gallops or rubs.  Abdomen: non-tender to palpation and soft, normoactive bowel sounds. Musculoskeletal:  Left Hip Exam:  The range of motion: Flexion to 100 degrees, internal rotation to 0 degrees, external rotation to 20 degrees, and abduction to 20 degrees.  There is no tenderness over the greater trochanteric bursa.  There is no pain on provocative testing of the hip.   Calves soft and nontender. Motor function intact in LE. Strength 5/5 LE bilaterally. Neuro: Distal pulses 2+. Sensation to light touch intact in LE.  Imaging Review Plain radiographs demonstrate severe degenerative joint disease of the left hip. The bone quality appears to be adequate for age and reported activity level.  Assessment/Plan:  End stage arthritis, left hip  The patient history, physical examination, clinical judgement of the provider and imaging studies are consistent with end stage degenerative joint disease of the left hip and total hip arthroplasty  is deemed medically necessary. The treatment options including medical management, injection therapy, arthroscopy and arthroplasty were discussed at length. The risks and benefits of total hip arthroplasty were presented and reviewed. The risks due to aseptic loosening, infection, stiffness, dislocation/subluxation, thromboembolic complications and other imponderables were discussed. The patient acknowledged the explanation, agreed to proceed with the plan and consent was signed. Patient is being admitted for inpatient treatment for surgery, pain control, PT, OT, prophylactic antibiotics, VTE prophylaxis, progressive ambulation and ADLs and discharge planning.The patient is planning to be discharged home.   Patient's anticipated LOS is less than 2 midnights, meeting these requirements: - Younger than 36 - Lives within 1 hour of care - Has a competent adult at home to recover with post-op recover - NO history of  - Chronic pain requiring opiods  - Diabetes  - Coronary Artery Disease  - Heart failure  - Heart attack  - Stroke  - DVT/VTE  - Cardiac arrhythmia  - Respiratory Failure/COPD  - Renal failure  - Anemia  - Advanced Liver disease  Therapy Plans: HEP Disposition: Home with wife Planned DVT Prophylaxis: Xarelto 15 mg QD DME Needed: None PCP: Henrine Screws, MD Cardiologist: Yates Decamp, MD (clearance provided) TXA: IV Allergies: PCN (anaphylaxis), sulfa, celebrex (rash) Anesthesia Concerns: Sleep apnea BMI: 37.8 Last HgbA1c: 6.7% (rechecking with preop labs) Pharmacy: [facility]  Other: - Recently diagnosed with atrial fibrillation, on 15 mg Xarelto QD. Stopping 2 days prior per Dr. Jacinto Halim. Underwent full cardiac workup in February, clearance in EPIC. - May require general anesthesia due to extensive lumbar surgery - Discussed dilaudid for postoperative pain management - Oxycodone 15 mg TID currently for chronic back issues - Wants hardware following surgery  -  Patient was instructed on what medications to stop prior to surgery. - Follow-up visit in 2 weeks with Dr. Lequita Halt - Begin physical therapy following surgery - Pre-operative lab work as pre-surgical testing - Prescriptions will be provided in hospital at time of discharge  Arther Abbott, PA-C Orthopedic Surgery [facility] [place]

## 2020-09-30 NOTE — Patient Instructions (Addendum)
DUE TO COVID-19 ONLY ONE VISITOR IS ALLOWED TO COME WITH YOU AND STAY IN THE WAITING ROOM ONLY DURING PRE OP AND PROCEDURE DAY OF SURGERY. THE 1 VISITOR  MAY VISIT WITH YOU AFTER SURGERY IN YOUR PRIVATE ROOM DURING VISITING HOURS ONLY!  YOU NEED TO HAVE A COVID 19 TEST ON: 10/04/20 @ 10:30 AM , THIS TEST MUST BE DONE BEFORE SURGERY,  COVID TESTING SITE [address] [place] [address], IT IS ON THE RIGHT GOING OUT [address] APPROXIMATELY  2 MINUTES PAST [employer] ON THE RIGHT. ONCE YOUR COVID TEST IS COMPLETED,  PLEASE BEGIN THE QUARANTINE INSTRUCTIONS AS OUTLINED IN YOUR HANDOUT.                [name]   Your procedure is scheduled on: 10/08/20   Report to [facility] Main  Entrance   Report to short stay at: 5:30 AM     Call this number if you have problems the morning of surgery [phone]    Remember:  NO SOLID FOOD AFTER MIDNIGHT THE NIGHT PRIOR TO SURGERY. NOTHING BY MOUTH EXCEPT CLEAR LIQUIDS UNTIL: 4:30 AM .   CLEAR LIQUID DIET  Foods Allowed                                                                     Foods Excluded  Coffee and tea, regular and decaf                             liquids that you cannot  Plain Jell-O any favor except red or purple                                           see through such as: Fruit ices (not with fruit pulp)                                     milk, soups, orange juice  Iced Popsicles                                    All solid food Carbonated beverages, regular and diet                                    Cranberry, grape and apple juices Sports drinks like Gatorade Lightly seasoned clear broth or consume(fat free) Sugar, honey syrup  Sample Menu Breakfast                                Lunch                                     Supper Cranberry juice  Beef broth                            Chicken broth Jell-O                                     Grape juice                            Apple juice Coffee or tea                        Jell-O                                      Popsicle                                                Coffee or tea                        Coffee or tea  _____________________________________________________________________  BRUSH YOUR TEETH MORNING OF SURGERY AND RINSE YOUR MOUTH OUT, NO CHEWING GUM CANDY OR MINTS.    Take these medicines the morning of surgery with A SIP OF WATER: metoprolol,escitalopram,gabapentin.  How to Manage Your Diabetes Before and After Surgery  Why is it important to control my blood sugar before and after surgery? . Improving blood sugar levels before and after surgery helps healing and can limit problems. . A way of improving blood sugar control is eating a healthy diet by: o  Eating less sugar and carbohydrates o  Increasing activity/exercise o  Talking with your doctor about reaching your blood sugar goals . High blood sugars (greater than 180 mg/dL) can raise your risk of infections and slow your recovery, so you will need to focus on controlling your diabetes during the weeks before surgery. . Make sure that the doctor who takes care of your diabetes knows about your planned surgery including the date and location.  How do I manage my blood sugar before surgery? . Check your blood sugar at least 4 times a day, starting 2 days before surgery, to make sure that the level is not too high or low. o Check your blood sugar the morning of your surgery when you wake up and every 2 hours until you get to the Short Stay unit. . If your blood sugar is less than 70 mg/dL, you will need to treat for low blood sugar: o Do not take insulin. o Treat a low blood sugar (less than 70 mg/dL) with  cup of clear juice (cranberry or apple), 4 glucose tablets, OR glucose gel. o Recheck blood sugar in 15 minutes after treatment (to make sure it is greater than 70 mg/dL). If your blood sugar is not greater than 70 mg/dL on  recheck, call [phone] for further instructions. . Report your blood sugar to the short stay nurse when you get to Short Stay.  . If you are admitted to the hospital after surgery: o Your blood sugar will be checked by the staff and you will probably be given insulin  after surgery (instead of oral diabetes medicines) to make sure you have good blood sugar levels. o The goal for blood sugar control after surgery is 80-180 mg/dL.   WHAT DO I DO ABOUT MY DIABETES MEDICATION?  Marland Kitchen Do not take oral diabetes medicines (pills) the morning of surgery.  . THE DAY BEFORE SURGERY, take ONLY morning dose of glipizide.Take pioglitazone(actos) as usual. Take ONLY half od the lantus usual dose.      . THE MORNING OF SURGERY, DO NOT TAKE ANY DIABETIC MEDICATIONS DAY OF YOUR SURGERY  . The day of surgery, do not take other diabetes injectables, including Byetta (exenatide), Bydureon (exenatide ER), Victoza (liraglutide), or Trulicity (dulaglutide).                               You may not have any metal on your body including hair pins and              piercings  Do not wear jewelry,lotions, powders or perfumes, deodorant             Men may shave face and neck.   Do not bring valuables to the hospital. [facility] IS NOT             RESPONSIBLE   FOR VALUABLES.  Contacts, dentures or bridgework may not be worn into surgery.  Leave suitcase in the car. After surgery it may be brought to your room.     Patients discharged the day of surgery will not be allowed to drive home. IF YOU ARE HAVING SURGERY AND GOING HOME THE SAME DAY, YOU MUST HAVE AN ADULT TO DRIVE YOU HOME AND BE WITH YOU FOR 24 HOURS. YOU MAY GO HOME BY TAXI OR UBER OR ORTHERWISE, BUT AN ADULT MUST ACCOMPANY YOU HOME AND STAY WITH YOU FOR 24 HOURS.  Name and phone number of your driver:  Special Instructions: N/A              Please read over the following fact sheets you were  given: _____________________________________________________________________         [facility] - Preparing for Surgery Before surgery, you can play an important role.  Because skin is not sterile, your skin needs to be as free of germs as possible.  You can reduce the number of germs on your skin by washing with CHG (chlorahexidine gluconate) soap before surgery.  CHG is an antiseptic cleaner which kills germs and bonds with the skin to continue killing germs even after washing. Please DO NOT use if you have an allergy to CHG or antibacterial soaps.  If your skin becomes reddened/irritated stop using the CHG and inform your nurse when you arrive at Short Stay. Do not shave (including legs and underarms) for at least 48 hours prior to the first CHG shower.  You may shave your face/neck. Please follow these instructions carefully:  1.  Shower with CHG Soap the night before surgery and the  morning of Surgery.  2.  If you choose to wash your hair, wash your hair first as usual with your  normal  shampoo.  3.  After you shampoo, rinse your hair and body thoroughly to remove the  shampoo.                           4.  Use CHG as you would any other liquid soap.  You  can apply chg directly  to the skin and wash                       Gently with a scrungie or clean washcloth.  5.  Apply the CHG Soap to your body ONLY FROM THE NECK DOWN.   Do not use on face/ open                           Wound or open sores. Avoid contact with eyes, ears mouth and genitals (private parts).                       Wash face,  Genitals (private parts) with your normal soap.             6.  Wash thoroughly, paying special attention to the area where your surgery  will be performed.  7.  Thoroughly rinse your body with warm water from the neck down.  8.  DO NOT shower/wash with your normal soap after using and rinsing off  the CHG Soap.                9.  Pat yourself dry with a clean towel.            10.  Wear clean  pajamas.            11.  Place clean sheets on your bed the night of your first shower and do not  sleep with pets. Day of Surgery : Do not apply any lotions/deodorants the morning of surgery.  Please wear clean clothes to the hospital/surgery center.  FAILURE TO FOLLOW THESE INSTRUCTIONS MAY RESULT IN THE CANCELLATION OF YOUR SURGERY PATIENT SIGNATURE_________________________________  NURSE SIGNATURE__________________________________  ________________________________________________________________________   Rogelia Mire  An incentive spirometer is a tool that can help keep your lungs clear and active. This tool measures how well you are filling your lungs with each breath. Taking long deep breaths may help reverse or decrease the chance of developing breathing (pulmonary) problems (especially infection) following:  A long period of time when you are unable to move or be active. BEFORE THE PROCEDURE   If the spirometer includes an indicator to show your best effort, your nurse or respiratory therapist will set it to a desired goal.  If possible, sit up straight or lean slightly forward. Try not to slouch.  Hold the incentive spirometer in an upright position. INSTRUCTIONS FOR USE  1. Sit on the edge of your bed if possible, or sit up as far as you can in bed or on a chair. 2. Hold the incentive spirometer in an upright position. 3. Breathe out normally. 4. Place the mouthpiece in your mouth and seal your lips tightly around it. 5. Breathe in slowly and as deeply as possible, raising the piston or the ball toward the top of the column. 6. Hold your breath for 3-5 seconds or for as long as possible. Allow the piston or ball to fall to the bottom of the column. 7. Remove the mouthpiece from your mouth and breathe out normally. 8. Rest for a few seconds and repeat Steps 1 through 7 at least 10 times every 1-2 hours when you are awake. Take your time and take a few normal breaths  between deep breaths. 9. The spirometer may include an indicator to show your best effort. Use the indicator as a goal to work toward  during each repetition. 10. After each set of 10 deep breaths, practice coughing to be sure your lungs are clear. If you have an incision (the cut made at the time of surgery), support your incision when coughing by placing a pillow or rolled up towels firmly against it. Once you are able to get out of bed, walk around indoors and cough well. You may stop using the incentive spirometer when instructed by your caregiver.  RISKS AND COMPLICATIONS  Take your time so you do not get dizzy or light-headed.  If you are in pain, you may need to take or ask for pain medication before doing incentive spirometry. It is harder to take a deep breath if you are having pain. AFTER USE  Rest and breathe slowly and easily.  It can be helpful to keep track of a log of your progress. Your caregiver can provide you with a simple table to help with this. If you are using the spirometer at home, follow these instructions: SEEK MEDICAL CARE IF:   You are having difficultly using the spirometer.  You have trouble using the spirometer as often as instructed.  Your pain medication is not giving enough relief while using the spirometer.  You develop fever of 100.5 F (38.1 C) or higher. SEEK IMMEDIATE MEDICAL CARE IF:   You cough up bloody sputum that had not been present before.  You develop fever of 102 F (38.9 C) or greater.  You develop worsening pain at or near the incision site. MAKE SURE YOU:   Understand these instructions.  Will watch your condition.  Will get help right away if you are not doing well or get worse. Document Released: 11/15/2006 Document Revised: 09/27/2011 Document Reviewed: 01/16/2007 [facility] Patient Information 2014 [place], [place].   ________________________________________________________________________

## 2020-10-01 ENCOUNTER — Encounter (HOSPITAL_COMMUNITY)
Admission: RE | Admit: 2020-10-01 | Discharge: 2020-10-01 | Disposition: A | Discharge status: Home / Self Care | Payer: Medicare Other | Source: Ambulatory Visit | Attending: Orthopedic Surgery | Admitting: Orthopedic Surgery

## 2020-10-01 ENCOUNTER — Other Ambulatory Visit: Payer: Self-pay

## 2020-10-01 ENCOUNTER — Encounter (HOSPITAL_COMMUNITY): Payer: Self-pay

## 2020-10-01 DIAGNOSIS — Z794 Long term (current) use of insulin: Secondary | ICD-10-CM | POA: Diagnosis not present

## 2020-10-01 DIAGNOSIS — Z87891 Personal history of nicotine dependence: Secondary | ICD-10-CM | POA: Diagnosis not present

## 2020-10-01 DIAGNOSIS — N183 Chronic kidney disease, stage 3 unspecified: Secondary | ICD-10-CM | POA: Diagnosis not present

## 2020-10-01 DIAGNOSIS — Z79899 Other long term (current) drug therapy: Secondary | ICD-10-CM | POA: Diagnosis not present

## 2020-10-01 DIAGNOSIS — E1122 Type 2 diabetes mellitus with diabetic chronic kidney disease: Secondary | ICD-10-CM | POA: Insufficient documentation

## 2020-10-01 DIAGNOSIS — I129 Hypertensive chronic kidney disease with stage 1 through stage 4 chronic kidney disease, or unspecified chronic kidney disease: Secondary | ICD-10-CM | POA: Diagnosis not present

## 2020-10-01 DIAGNOSIS — I739 Peripheral vascular disease, unspecified: Secondary | ICD-10-CM | POA: Insufficient documentation

## 2020-10-01 DIAGNOSIS — M1612 Unilateral primary osteoarthritis, left hip: Secondary | ICD-10-CM | POA: Insufficient documentation

## 2020-10-01 DIAGNOSIS — G4733 Obstructive sleep apnea (adult) (pediatric): Secondary | ICD-10-CM | POA: Insufficient documentation

## 2020-10-01 DIAGNOSIS — Z01812 Encounter for preprocedural laboratory examination: Secondary | ICD-10-CM | POA: Insufficient documentation

## 2020-10-01 DIAGNOSIS — Z7901 Long term (current) use of anticoagulants: Secondary | ICD-10-CM | POA: Insufficient documentation

## 2020-10-01 DIAGNOSIS — I4891 Unspecified atrial fibrillation: Secondary | ICD-10-CM | POA: Diagnosis not present

## 2020-10-01 HISTORY — DX: Cardiac arrhythmia, unspecified: I49.9

## 2020-10-01 HISTORY — DX: Pneumonia, unspecified organism: J18.9

## 2020-10-01 HISTORY — DX: Essential (primary) hypertension: I10

## 2020-10-01 LAB — GLUCOSE, CAPILLARY: Glucose-Capillary: 166 mg/dL — ABNORMAL HIGH (ref 70–99)

## 2020-10-01 LAB — CBC
HCT: 40.7 % (ref 39.0–52.0)
Hemoglobin: 13 g/dL (ref 13.0–17.0)
MCH: 29.1 pg (ref 26.0–34.0)
MCHC: 31.9 g/dL (ref 30.0–36.0)
MCV: 91.3 fL (ref 80.0–100.0)
Platelets: 141 10*3/uL — ABNORMAL LOW (ref 150–400)
RBC: 4.46 MIL/uL (ref 4.22–5.81)
RDW: 13.5 % (ref 11.5–15.5)
WBC: 5.3 10*3/uL (ref 4.0–10.5)
nRBC: 0 % (ref 0.0–0.2)

## 2020-10-01 LAB — COMPREHENSIVE METABOLIC PANEL
ALT: 18 U/L (ref 0–44)
AST: 19 U/L (ref 15–41)
Albumin: 4.1 g/dL (ref 3.5–5.0)
Alkaline Phosphatase: 48 U/L (ref 38–126)
Anion gap: 9 (ref 5–15)
BUN: 33 mg/dL — ABNORMAL HIGH (ref 8–23)
CO2: 27 mmol/L (ref 22–32)
Calcium: 9.2 mg/dL (ref 8.9–10.3)
Chloride: 102 mmol/L (ref 98–111)
Creatinine, Ser: 1.58 mg/dL — ABNORMAL HIGH (ref 0.61–1.24)
GFR, Estimated: 47 mL/min — ABNORMAL LOW (ref >60)
Glucose, Bld: 171 mg/dL — ABNORMAL HIGH (ref 70–99)
Potassium: 4.7 mmol/L (ref 3.5–5.1)
Sodium: 138 mmol/L (ref 135–145)
Total Bilirubin: 0.4 mg/dL (ref 0.3–1.2)
Total Protein: 7.2 g/dL (ref 6.5–8.1)

## 2020-10-01 LAB — SURGICAL PCR SCREEN
MRSA, PCR: NEGATIVE
Staphylococcus aureus: NEGATIVE

## 2020-10-01 LAB — HEMOGLOBIN A1C
Hgb A1c MFr Bld: 6.5 % — ABNORMAL HIGH (ref 4.8–5.6)
Mean Plasma Glucose: 139.85 mg/dL

## 2020-10-01 LAB — APTT: aPTT: 34 seconds (ref 24–36)

## 2020-10-01 LAB — PROTIME-INR
INR: 1.2 (ref 0.8–1.2)
Prothrombin Time: 15.1 seconds (ref 11.4–15.2)

## 2020-10-01 NOTE — Progress Notes (Signed)
COVID Vaccine Completed: Yes Date COVID Vaccine completed:03/2020 [place] COVID vaccine manufacturer: Pfizer     PCP - Dr. Henrine Screws Cardiologist - Dr. Yates Decamp : Clearance: 09/09/20: EPIC  Chest x-ray -  EKG - 09/12/20 Stress Test -  ECHO - 09/05/20 Cardiac Cath -  Pacemaker/ICD device last checked:  Sleep Study - YES CPAP - YES  Fasting Blood Sugar - 100's Checks Blood Sugar ___1__ times a day  Blood Thinner Instructions: Xarelto will be on hold two days before surgery as per Dr. Jacinto Halim. Aspirin Instructions: Last Dose:  Anesthesia review: Hx: HTN,DIA,Afib,OSA(CPAP)  Patient denies shortness of breath, fever, cough and chest pain at PAT appointment   Patient verbalized understanding of instructions that were given to them at the PAT appointment. Patient was also instructed that they will need to review over the PAT instructions again at home before surgery.

## 2020-10-02 NOTE — Anesthesia Preprocedure Evaluation (Addendum)
Anesthesia Evaluation  Patient identified by MRN, date of birth, ID band Patient awake    Reviewed: Allergy & Precautions, H&P , NPO status , Patient's Chart, lab work & pertinent test results  History of Anesthesia Complications (+) Family history of anesthesia reaction  Airway Mallampati: III  TM Distance: >3 FB Neck ROM: Full    Dental no notable dental hx. (+) Teeth Intact, Dental Advisory Given   Pulmonary sleep apnea and Continuous Positive Airway Pressure Ventilation , former smoker,    Pulmonary exam normal breath sounds clear to auscultation       Cardiovascular Exercise Tolerance: Good hypertension, Pt. on medications and Pt. on home beta blockers + dysrhythmias Atrial Fibrillation  Rhythm:Regular Rate:Normal     Neuro/Psych Anxiety Depression negative neurological ROS     GI/Hepatic negative GI ROS, Neg liver ROS,   Endo/Other  diabetes, Insulin Dependent, Oral Hypoglycemic AgentsMorbid obesity  Renal/GU Renal InsufficiencyRenal disease  negative genitourinary   Musculoskeletal  (+) Arthritis , Osteoarthritis,    Abdominal   Peds  Hematology negative hematology ROS (+)   Anesthesia Other Findings   Reproductive/Obstetrics negative OB ROS                           Anesthesia Physical Anesthesia Plan  ASA: III  Anesthesia Plan: General   Post-op Pain Management:    Induction: Intravenous  PONV Risk Score and Plan: 3 and Ondansetron, Dexamethasone and Midazolam  Airway Management Planned: Oral ETT and Video Laryngoscope Planned  Additional Equipment:   Intra-op Plan:   Post-operative Plan: Extubation in OR  Informed Consent: I have reviewed the patients History and Physical, chart, labs and discussed the procedure including the risks, benefits and alternatives for the proposed anesthesia with the patient or authorized representative who has indicated his/her  understanding and acceptance.     Dental advisory given  Plan Discussed with: CRNA  Anesthesia Plan Comments: (See PAT note 10/01/2020, Jodell Cipro, PA-C)      Anesthesia Quick Evaluation

## 2020-10-02 NOTE — Progress Notes (Signed)
Anesthesia Chart Review   Case: [id number] Date/Time: 10/08/20 0715   Procedures:      TOTAL HIP ARTHROPLASTY ANTERIOR APPROACH (Left Hip) -     HARDWARE REMOVAL (N/A )   Anesthesia type: Choice   Pre-op diagnosis: Left hip osteoarthritis,RETAINED HARDWARE   Location: [facility] ROOM 10 / [facility]   Surgeons: Ollen Gross, MD      DISCUSSION:70 y.o. former smoker with h/o sleep apnea, CKD Stage III, DM II, left hip OA, a-fib (on Xarelto), PAD, nephrology, OSA on CPAP and peripheral neuropathy. PAD, degenerative joint disease, chronic back pain with extensive lumbar surgery, retained hardware scheduled for above procedure 08/13/2020 with Dr. Ollen Gross.   Surgery previously postponed due to new onset a-fib found at PAT visit.   He was seen by cardiology for evaluation.  Last seen 09/12/2020. Per OV note, "Patient is aware to hold Xarelto for 2 days prior to his surgery for left hip arthroplasty.  I have discontinued aspirin to reduce bleeding risk.  Fortunately echocardiogram reveals preserved LVEF and no evidence of ischemia/low risk.  Hence he can undergo surgery with low risk for cardiac complications. BP is well controlled.  He is on a statin and lipids are controlled."  Clearance from PCP received which states pt is moderate risk for surgical procedure due to DM II, CKD, obesity.   Anticipate pt can proceed with planned procedure barring acute status change.   VS: BP (!) 113/55   Pulse 62   Temp 37.1 C (Oral)   Ht 5\' 7"  (1.702 m)   Wt 111.1 kg   SpO2 98%   BMI 38.37 kg/m   PROVIDERS: , MD is PCP   Henrine Screws, MD is Nephrologist last seen 05/16/20  05/18/20, MD is Cardiologist  LABS: Labs reviewed: Acceptable for surgery. (all labs ordered are listed, but only abnormal results are displayed)  Labs Reviewed  HEMOGLOBIN A1C - Abnormal; Notable for the following components:      Result Value   Hgb A1c MFr Bld 6.5 (*)    All other components  within normal limits  GLUCOSE, CAPILLARY - Abnormal; Notable for the following components:   Glucose-Capillary 166 (*)    All other components within normal limits  CBC - Abnormal; Notable for the following components:   Platelets 141 (*)    All other components within normal limits  COMPREHENSIVE METABOLIC PANEL - Abnormal; Notable for the following components:   Glucose, Bld 171 (*)    BUN 33 (*)    Creatinine, Ser 1.58 (*)    GFR, Estimated 47 (*)    All other components within normal limits  SURGICAL PCR SCREEN  PROTIME-INR  APTT  TYPE AND SCREEN     IMAGES:   EKG: EKG 09/12/2020: Atrial fibrillation with controlled ventricular response at rate of 64 bpm, left axis deviation, left anterior fascicular block.  Incomplete right bundle branch block.  Low-voltage complexes.  Normal QT interval.   CV: PCV MYOCARDIAL PERFUSION WITH LEXISCAN 08/25/2020 Lexiscan nuclear stress test performed using 1-day protocol. Stress EKG is non-diagnostic, as this is pharmacological stress test. In addition, rest and stress EKG showed atrial fibrillation with controlled ventricular rate. Normal myocardial perfusion. Stress LVEF 72%. Low risk study.  PCV ECHOCARDIOGRAM COMPLETE 09/05/2020 Normal LV systolic function with visual EF 55-60%. Left ventricle cavity is normal in size. Mild left ventricular hypertrophy. Normal global wall motion. Indeterminate diastolic filling pattern, elevated LAP. No significant valvular heart disease. No prior study for comparison.  Past Medical History:  Diagnosis Date  . Anxiety   . Arthritis    back, wrists elbows knees  . Chronic kidney disease    stage 3  . Complication of anesthesia    narrow airway  . Depression   . Diabetes mellitus without complication (HCC)   . Dysrhythmia    Afib  . Family history of adverse reaction to anesthesia    slow to wake up  . Hypertension   . Neuromuscular disorder (HCC)    neuropathy in feet  . Pneumonia   .  Sleep apnea    CPAP    Past Surgical History:  Procedure Laterality Date  . BACK SURGERY    . HIP PINNING Bilateral [id number]  . JOINT REPLACEMENT Right 2010  . KNEE ARTHROSCOPY Right 2009  . NASAL SEPTOPLASTY W/ TURBINOPLASTY  1997  . SHOULDER ARTHROSCOPY Right 2008  . TONSILLECTOMY     as a child    MEDICATIONS: . acetaminophen (TYLENOL) 500 MG tablet  . cholecalciferol (VITAMIN D3) 25 MCG (1000 UNIT) tablet  . docusate sodium (COLACE) 100 MG capsule  . escitalopram (LEXAPRO) 20 MG tablet  . furosemide (LASIX) 20 MG tablet  . gabapentin (NEURONTIN) 100 MG capsule  . glipiZIDE (GLUCOTROL) 10 MG tablet  . insulin glargine (LANTUS) 100 UNIT/ML Solostar Pen  . lidocaine (LIDODERM) 5 %  . Melatonin 10 MG TABS  . metoprolol tartrate (LOPRESSOR) 25 MG tablet  . Multiple Vitamins-Minerals (MULTIVITAMIN WITH MINERALS) tablet  . oxyCODONE (ROXICODONE) 15 MG immediate release tablet  . pioglitazone (ACTOS) 30 MG tablet  . Polyethyl Glycol-Propyl Glycol (SYSTANE OP)  . Rivaroxaban (XARELTO) 15 MG TABS tablet  . simvastatin (ZOCOR) 40 MG tablet  . trandolapril (MAVIK) 1 MG tablet  . traZODone (DESYREL) 100 MG tablet  . TRULICITY 1.5 MG/0.5ML SOPN  . vitamin B-12 (CYANOCOBALAMIN) 1000 MCG tablet  . vitamin C (ASCORBIC ACID) 250 MG tablet   No current facility-administered medications for this encounter.    Jodell Cipro, PA-C [facility] Pre-Surgical Testing [phone]

## 2020-10-04 ENCOUNTER — Other Ambulatory Visit (HOSPITAL_COMMUNITY)
Admission: RE | Admit: 2020-10-04 | Discharge: 2020-10-04 | Disposition: A | Discharge status: Home / Self Care | Payer: Medicare Other | Source: Ambulatory Visit | Attending: Orthopedic Surgery | Admitting: Orthopedic Surgery

## 2020-10-04 DIAGNOSIS — Z01812 Encounter for preprocedural laboratory examination: Secondary | ICD-10-CM | POA: Insufficient documentation

## 2020-10-04 DIAGNOSIS — Z20822 Contact with and (suspected) exposure to covid-19: Secondary | ICD-10-CM | POA: Insufficient documentation

## 2020-10-04 LAB — SARS CORONAVIRUS 2 (TAT 6-24 HRS): SARS Coronavirus 2: NEGATIVE

## 2020-10-05 DIAGNOSIS — E1140 Type 2 diabetes mellitus with diabetic neuropathy, unspecified: Secondary | ICD-10-CM | POA: Diagnosis not present

## 2020-10-05 DIAGNOSIS — I4891 Unspecified atrial fibrillation: Secondary | ICD-10-CM | POA: Diagnosis not present

## 2020-10-05 DIAGNOSIS — G8929 Other chronic pain: Secondary | ICD-10-CM | POA: Diagnosis not present

## 2020-10-05 DIAGNOSIS — E782 Mixed hyperlipidemia: Secondary | ICD-10-CM | POA: Diagnosis not present

## 2020-10-05 DIAGNOSIS — N183 Chronic kidney disease, stage 3 unspecified: Secondary | ICD-10-CM | POA: Diagnosis not present

## 2020-10-05 DIAGNOSIS — M1612 Unilateral primary osteoarthritis, left hip: Secondary | ICD-10-CM | POA: Diagnosis not present

## 2020-10-05 DIAGNOSIS — E1142 Type 2 diabetes mellitus with diabetic polyneuropathy: Secondary | ICD-10-CM | POA: Diagnosis not present

## 2020-10-07 MED ORDER — VANCOMYCIN HCL 1500 MG/300ML IV SOLN
1500.0000 mg | INTRAVENOUS | Status: AC
  Administered 2020-10-08: 1500 mg via INTRAVENOUS
  Filled 2020-10-07: qty 300

## 2020-10-08 ENCOUNTER — Encounter (HOSPITAL_COMMUNITY)
Admission: RE | Disposition: A | Discharge status: Home / Self Care | Payer: Self-pay | Source: Home / Self Care | Attending: Orthopedic Surgery

## 2020-10-08 ENCOUNTER — Inpatient Hospital Stay (HOSPITAL_COMMUNITY): Payer: Medicare Other | Admitting: Physician Assistant

## 2020-10-08 ENCOUNTER — Observation Stay (HOSPITAL_COMMUNITY): Payer: Medicare Other

## 2020-10-08 ENCOUNTER — Inpatient Hospital Stay (HOSPITAL_COMMUNITY)
Admission: RE | Admit: 2020-10-08 | Discharge: 2020-10-13 | DRG: 470 | Disposition: A | Discharge status: Home / Self Care | Payer: Medicare Other | Attending: Orthopedic Surgery | Admitting: Orthopedic Surgery

## 2020-10-08 ENCOUNTER — Encounter (HOSPITAL_COMMUNITY): Payer: Self-pay | Admitting: Orthopedic Surgery

## 2020-10-08 ENCOUNTER — Inpatient Hospital Stay (HOSPITAL_COMMUNITY): Payer: Medicare Other | Admitting: Certified Registered Nurse Anesthetist

## 2020-10-08 ENCOUNTER — Other Ambulatory Visit: Payer: Self-pay

## 2020-10-08 DIAGNOSIS — Z7984 Long term (current) use of oral hypoglycemic drugs: Secondary | ICD-10-CM | POA: Diagnosis not present

## 2020-10-08 DIAGNOSIS — G4730 Sleep apnea, unspecified: Secondary | ICD-10-CM | POA: Diagnosis present

## 2020-10-08 DIAGNOSIS — Z79891 Long term (current) use of opiate analgesic: Secondary | ICD-10-CM | POA: Diagnosis not present

## 2020-10-08 DIAGNOSIS — E1122 Type 2 diabetes mellitus with diabetic chronic kidney disease: Secondary | ICD-10-CM | POA: Diagnosis not present

## 2020-10-08 DIAGNOSIS — N189 Chronic kidney disease, unspecified: Secondary | ICD-10-CM | POA: Diagnosis present

## 2020-10-08 DIAGNOSIS — I129 Hypertensive chronic kidney disease with stage 1 through stage 4 chronic kidney disease, or unspecified chronic kidney disease: Secondary | ICD-10-CM | POA: Diagnosis not present

## 2020-10-08 DIAGNOSIS — I130 Hypertensive heart and chronic kidney disease with heart failure and stage 1 through stage 4 chronic kidney disease, or unspecified chronic kidney disease: Secondary | ICD-10-CM | POA: Diagnosis present

## 2020-10-08 DIAGNOSIS — Z96642 Presence of left artificial hip joint: Secondary | ICD-10-CM | POA: Diagnosis not present

## 2020-10-08 DIAGNOSIS — Z7901 Long term (current) use of anticoagulants: Secondary | ICD-10-CM | POA: Diagnosis not present

## 2020-10-08 DIAGNOSIS — Z882 Allergy status to sulfonamides status: Secondary | ICD-10-CM | POA: Diagnosis not present

## 2020-10-08 DIAGNOSIS — F32A Depression, unspecified: Secondary | ICD-10-CM | POA: Diagnosis not present

## 2020-10-08 DIAGNOSIS — Z880 Allergy status to penicillin: Secondary | ICD-10-CM

## 2020-10-08 DIAGNOSIS — Z794 Long term (current) use of insulin: Secondary | ICD-10-CM | POA: Diagnosis not present

## 2020-10-08 DIAGNOSIS — Z96649 Presence of unspecified artificial hip joint: Secondary | ICD-10-CM | POA: Diagnosis present

## 2020-10-08 DIAGNOSIS — G8929 Other chronic pain: Secondary | ICD-10-CM | POA: Diagnosis present

## 2020-10-08 DIAGNOSIS — Z807 Family history of other malignant neoplasms of lymphoid, hematopoietic and related tissues: Secondary | ICD-10-CM

## 2020-10-08 DIAGNOSIS — Z888 Allergy status to other drugs, medicaments and biological substances status: Secondary | ICD-10-CM

## 2020-10-08 DIAGNOSIS — M169 Osteoarthritis of hip, unspecified: Secondary | ICD-10-CM | POA: Diagnosis present

## 2020-10-08 DIAGNOSIS — Z471 Aftercare following joint replacement surgery: Secondary | ICD-10-CM | POA: Diagnosis not present

## 2020-10-08 DIAGNOSIS — F419 Anxiety disorder, unspecified: Secondary | ICD-10-CM | POA: Diagnosis not present

## 2020-10-08 DIAGNOSIS — Z87891 Personal history of nicotine dependence: Secondary | ICD-10-CM

## 2020-10-08 DIAGNOSIS — M1612 Unilateral primary osteoarthritis, left hip: Principal | ICD-10-CM | POA: Diagnosis present

## 2020-10-08 DIAGNOSIS — D631 Anemia in chronic kidney disease: Secondary | ICD-10-CM | POA: Diagnosis present

## 2020-10-08 DIAGNOSIS — N183 Chronic kidney disease, stage 3 unspecified: Secondary | ICD-10-CM | POA: Diagnosis not present

## 2020-10-08 DIAGNOSIS — Z79899 Other long term (current) drug therapy: Secondary | ICD-10-CM

## 2020-10-08 DIAGNOSIS — Z419 Encounter for procedure for purposes other than remedying health state, unspecified: Secondary | ICD-10-CM

## 2020-10-08 DIAGNOSIS — I4891 Unspecified atrial fibrillation: Secondary | ICD-10-CM | POA: Diagnosis present

## 2020-10-08 DIAGNOSIS — Z800 Family history of malignant neoplasm of digestive organs: Secondary | ICD-10-CM

## 2020-10-08 HISTORY — PX: HARDWARE REMOVAL: SHX979

## 2020-10-08 HISTORY — PX: TOTAL HIP ARTHROPLASTY: SHX124

## 2020-10-08 LAB — GLUCOSE, CAPILLARY
Glucose-Capillary: 143 mg/dL — ABNORMAL HIGH (ref 70–99)
Glucose-Capillary: 250 mg/dL — ABNORMAL HIGH (ref 70–99)
Glucose-Capillary: 339 mg/dL — ABNORMAL HIGH (ref 70–99)

## 2020-10-08 LAB — TYPE AND SCREEN
ABO/RH(D): O POS
Antibody Screen: NEGATIVE

## 2020-10-08 SURGERY — ARTHROPLASTY, HIP, TOTAL, ANTERIOR APPROACH
Anesthesia: General | Site: Hip | Laterality: Left

## 2020-10-08 MED ORDER — ORAL CARE MOUTH RINSE: 15.0000 mL | Freq: Once | OROMUCOSAL | Status: AC

## 2020-10-08 MED ORDER — DEXAMETHASONE SODIUM PHOSPHATE 10 MG/ML IJ SOLN
8.0000 mg | Freq: Once | INTRAMUSCULAR | Status: AC
  Administered 2020-10-08: 4 mg via INTRAVENOUS

## 2020-10-08 MED ORDER — HYDROMORPHONE HCL 2 MG/ML IJ SOLN
INTRAMUSCULAR | Status: AC
  Filled 2020-10-08: qty 1

## 2020-10-08 MED ORDER — BUPIVACAINE-EPINEPHRINE (PF) 0.25% -1:200000 IJ SOLN
INTRAMUSCULAR | Status: DC | PRN
  Administered 2020-10-08: 30 mL

## 2020-10-08 MED ORDER — DEXAMETHASONE SODIUM PHOSPHATE 10 MG/ML IJ SOLN
INTRAMUSCULAR | Status: AC
  Filled 2020-10-08: qty 1

## 2020-10-08 MED ORDER — ROCURONIUM BROMIDE 10 MG/ML (PF) SYRINGE
PREFILLED_SYRINGE | INTRAVENOUS | Status: AC
  Filled 2020-10-08: qty 10

## 2020-10-08 MED ORDER — ESCITALOPRAM OXALATE 20 MG PO TABS
20.0000 mg | ORAL_TABLET | Freq: Every day | ORAL | Status: DC
  Administered 2020-10-09 – 2020-10-13 (×5): 20 mg via ORAL
  Filled 2020-10-08 (×5): qty 1

## 2020-10-08 MED ORDER — ACETAMINOPHEN 325 MG PO TABS
325.0000 mg | ORAL_TABLET | Freq: Four times a day (QID) | ORAL | Status: DC | PRN
  Administered 2020-10-09 – 2020-10-13 (×4): 650 mg via ORAL
  Filled 2020-10-08 (×4): qty 2

## 2020-10-08 MED ORDER — WATER FOR IRRIGATION, STERILE IR SOLN
Status: DC | PRN
  Administered 2020-10-08: 2000 mL

## 2020-10-08 MED ORDER — METOPROLOL TARTRATE 25 MG PO TABS
25.0000 mg | ORAL_TABLET | Freq: Two times a day (BID) | ORAL | Status: DC
  Administered 2020-10-08 – 2020-10-13 (×10): 25 mg via ORAL
  Filled 2020-10-08 (×10): qty 1

## 2020-10-08 MED ORDER — ROCURONIUM BROMIDE 100 MG/10ML IV SOLN
INTRAVENOUS | Status: DC | PRN
  Administered 2020-10-08: 10 mg via INTRAVENOUS
  Administered 2020-10-08: 20 mg via INTRAVENOUS
  Administered 2020-10-08: 70 mg via INTRAVENOUS
  Administered 2020-10-08: 30 mg via INTRAVENOUS
  Administered 2020-10-08: 20 mg via INTRAVENOUS
  Administered 2020-10-08: 10 mg via INTRAVENOUS
  Administered 2020-10-08: 30 mg via INTRAVENOUS

## 2020-10-08 MED ORDER — ONDANSETRON HCL 4 MG/2ML IJ SOLN: 4.0000 mg | Freq: Four times a day (QID) | INTRAMUSCULAR | Status: DC | PRN

## 2020-10-08 MED ORDER — HYDROMORPHONE HCL 1 MG/ML IJ SOLN
INTRAMUSCULAR | Status: AC
  Administered 2020-10-08: 0.5 mg via INTRAVENOUS
  Filled 2020-10-08: qty 2

## 2020-10-08 MED ORDER — BISACODYL 10 MG RE SUPP: 10.0000 mg | Freq: Every day | RECTAL | Status: DC | PRN

## 2020-10-08 MED ORDER — METOCLOPRAMIDE HCL 5 MG/ML IJ SOLN: 5.0000 mg | Freq: Three times a day (TID) | INTRAMUSCULAR | Status: DC | PRN

## 2020-10-08 MED ORDER — EPHEDRINE 5 MG/ML INJ
INTRAVENOUS | Status: AC
  Filled 2020-10-08: qty 10

## 2020-10-08 MED ORDER — METHOCARBAMOL 500 MG IVPB - SIMPLE MED
500.0000 mg | Freq: Four times a day (QID) | INTRAVENOUS | Status: DC | PRN
  Administered 2020-10-08: 500 mg via INTRAVENOUS
  Filled 2020-10-08: qty 50

## 2020-10-08 MED ORDER — INSULIN ASPART 100 UNIT/ML ~~LOC~~ SOLN
0.0000 [IU] | Freq: Every day | SUBCUTANEOUS | Status: DC
  Administered 2020-10-08 – 2020-10-09 (×2): 4 [IU] via SUBCUTANEOUS

## 2020-10-08 MED ORDER — METHOCARBAMOL 500 MG PO TABS
500.0000 mg | ORAL_TABLET | Freq: Four times a day (QID) | ORAL | Status: DC | PRN
  Administered 2020-10-09 – 2020-10-12 (×4): 500 mg via ORAL
  Filled 2020-10-08 (×5): qty 1

## 2020-10-08 MED ORDER — LIDOCAINE HCL (CARDIAC) PF 100 MG/5ML IV SOSY
PREFILLED_SYRINGE | INTRAVENOUS | Status: DC | PRN
  Administered 2020-10-08: 60 mg via INTRAVENOUS

## 2020-10-08 MED ORDER — EPHEDRINE SULFATE 50 MG/ML IJ SOLN
INTRAMUSCULAR | Status: DC | PRN
  Administered 2020-10-08 (×2): 5 mg via INTRAVENOUS

## 2020-10-08 MED ORDER — TRANEXAMIC ACID-NACL 1000-0.7 MG/100ML-% IV SOLN
1000.0000 mg | INTRAVENOUS | Status: AC
  Administered 2020-10-08: 1000 mg via INTRAVENOUS
  Filled 2020-10-08: qty 100

## 2020-10-08 MED ORDER — VANCOMYCIN HCL 1000 MG/200ML IV SOLN
1000.0000 mg | Freq: Two times a day (BID) | INTRAVENOUS | Status: AC
  Administered 2020-10-08: 1000 mg via INTRAVENOUS
  Filled 2020-10-08: qty 200

## 2020-10-08 MED ORDER — HYDROMORPHONE HCL 1 MG/ML IJ SOLN
0.2500 mg | INTRAMUSCULAR | Status: DC | PRN
  Administered 2020-10-08 (×3): 0.5 mg via INTRAVENOUS

## 2020-10-08 MED ORDER — LACTATED RINGERS IV SOLN: INTRAVENOUS | Status: DC

## 2020-10-08 MED ORDER — ALBUMIN HUMAN 5 % IV SOLN
INTRAVENOUS | Status: AC
  Filled 2020-10-08: qty 250

## 2020-10-08 MED ORDER — SIMVASTATIN 40 MG PO TABS
40.0000 mg | ORAL_TABLET | Freq: Every day | ORAL | Status: DC
  Administered 2020-10-09 – 2020-10-13 (×5): 40 mg via ORAL
  Filled 2020-10-08 (×5): qty 1

## 2020-10-08 MED ORDER — ONDANSETRON HCL 4 MG/2ML IJ SOLN
INTRAMUSCULAR | Status: AC
  Filled 2020-10-08: qty 2

## 2020-10-08 MED ORDER — DEXAMETHASONE SODIUM PHOSPHATE 10 MG/ML IJ SOLN
10.0000 mg | Freq: Once | INTRAMUSCULAR | Status: AC
  Administered 2020-10-09: 10 mg via INTRAVENOUS
  Filled 2020-10-08: qty 1

## 2020-10-08 MED ORDER — CHLORHEXIDINE GLUCONATE 0.12 % MT SOLN
15.0000 mL | Freq: Once | OROMUCOSAL | Status: AC
  Administered 2020-10-08: 15 mL via OROMUCOSAL

## 2020-10-08 MED ORDER — PHENYLEPHRINE HCL (PRESSORS) 10 MG/ML IV SOLN
INTRAVENOUS | Status: DC | PRN
  Administered 2020-10-08 (×2): 80 ug via INTRAVENOUS
  Administered 2020-10-08: 40 ug via INTRAVENOUS
  Administered 2020-10-08: 120 ug via INTRAVENOUS

## 2020-10-08 MED ORDER — TRAZODONE HCL 100 MG PO TABS
100.0000 mg | ORAL_TABLET | Freq: Every evening | ORAL | Status: DC | PRN
  Administered 2020-10-10 – 2020-10-12 (×2): 100 mg via ORAL
  Filled 2020-10-08 (×2): qty 1

## 2020-10-08 MED ORDER — MIDAZOLAM HCL 5 MG/5ML IJ SOLN
INTRAMUSCULAR | Status: DC | PRN
  Administered 2020-10-08 (×2): 1 mg via INTRAVENOUS

## 2020-10-08 MED ORDER — RIVAROXABAN 10 MG PO TABS
10.0000 mg | ORAL_TABLET | Freq: Every day | ORAL | Status: DC
  Administered 2020-10-09 – 2020-10-13 (×5): 10 mg via ORAL
  Filled 2020-10-08 (×5): qty 1

## 2020-10-08 MED ORDER — PIOGLITAZONE HCL 30 MG PO TABS
30.0000 mg | ORAL_TABLET | Freq: Every day | ORAL | Status: DC
  Administered 2020-10-09 – 2020-10-13 (×5): 30 mg via ORAL
  Filled 2020-10-08 (×6): qty 1

## 2020-10-08 MED ORDER — POVIDONE-IODINE 10 % EX SWAB
2.0000 "application " | Freq: Once | CUTANEOUS | Status: AC
  Administered 2020-10-08: 2 via TOPICAL

## 2020-10-08 MED ORDER — GABAPENTIN 100 MG PO CAPS
200.0000 mg | ORAL_CAPSULE | Freq: Four times a day (QID) | ORAL | Status: DC
  Administered 2020-10-08 – 2020-10-13 (×21): 200 mg via ORAL
  Filled 2020-10-08 (×21): qty 2

## 2020-10-08 MED ORDER — HYDROMORPHONE HCL 1 MG/ML IJ SOLN
INTRAMUSCULAR | Status: DC | PRN
  Administered 2020-10-08 (×4): .5 mg via INTRAVENOUS

## 2020-10-08 MED ORDER — MENTHOL 3 MG MT LOZG: 1.0000 | LOZENGE | OROMUCOSAL | Status: DC | PRN

## 2020-10-08 MED ORDER — MAGNESIUM CITRATE PO SOLN: 1.0000 | Freq: Once | ORAL | Status: DC | PRN

## 2020-10-08 MED ORDER — HYDROMORPHONE HCL 2 MG PO TABS
2.0000 mg | ORAL_TABLET | ORAL | Status: DC | PRN
  Administered 2020-10-08 – 2020-10-09 (×4): 4 mg via ORAL
  Administered 2020-10-09: 2 mg via ORAL
  Administered 2020-10-09 (×2): 4 mg via ORAL
  Administered 2020-10-11 – 2020-10-12 (×6): 2 mg via ORAL
  Administered 2020-10-13: 4 mg via ORAL
  Filled 2020-10-08 (×2): qty 2
  Filled 2020-10-08 (×2): qty 1
  Filled 2020-10-08: qty 2
  Filled 2020-10-08: qty 1
  Filled 2020-10-08 (×2): qty 2
  Filled 2020-10-08: qty 1
  Filled 2020-10-08: qty 2
  Filled 2020-10-08 (×3): qty 1
  Filled 2020-10-08: qty 2

## 2020-10-08 MED ORDER — FUROSEMIDE 20 MG PO TABS: 20.0000 mg | ORAL_TABLET | Freq: Every day | ORAL | Status: DC | PRN

## 2020-10-08 MED ORDER — BUPIVACAINE-EPINEPHRINE (PF) 0.25% -1:200000 IJ SOLN
INTRAMUSCULAR | Status: AC
  Filled 2020-10-08: qty 30

## 2020-10-08 MED ORDER — 0.9 % SODIUM CHLORIDE (POUR BTL) OPTIME
TOPICAL | Status: DC | PRN
  Administered 2020-10-08: 1000 mL

## 2020-10-08 MED ORDER — MIDAZOLAM HCL 2 MG/2ML IJ SOLN
INTRAMUSCULAR | Status: AC
  Filled 2020-10-08: qty 2

## 2020-10-08 MED ORDER — MORPHINE SULFATE (PF) 2 MG/ML IV SOLN
0.5000 mg | INTRAVENOUS | Status: DC | PRN
  Administered 2020-10-08 – 2020-10-09 (×4): 1 mg via INTRAVENOUS
  Filled 2020-10-08 (×4): qty 1

## 2020-10-08 MED ORDER — SUGAMMADEX SODIUM 200 MG/2ML IV SOLN
INTRAVENOUS | Status: DC | PRN
  Administered 2020-10-08: 200 mg via INTRAVENOUS

## 2020-10-08 MED ORDER — ONDANSETRON HCL 4 MG/2ML IJ SOLN
INTRAMUSCULAR | Status: DC | PRN
  Administered 2020-10-08: 4 mg via INTRAVENOUS

## 2020-10-08 MED ORDER — DOCUSATE SODIUM 100 MG PO CAPS
100.0000 mg | ORAL_CAPSULE | Freq: Two times a day (BID) | ORAL | Status: DC
  Administered 2020-10-08 – 2020-10-13 (×6): 100 mg via ORAL
  Filled 2020-10-08 (×10): qty 1

## 2020-10-08 MED ORDER — PROPOFOL 10 MG/ML IV BOLUS
INTRAVENOUS | Status: AC
  Filled 2020-10-08: qty 20

## 2020-10-08 MED ORDER — INSULIN ASPART 100 UNIT/ML ~~LOC~~ SOLN
0.0000 [IU] | Freq: Three times a day (TID) | SUBCUTANEOUS | Status: DC
  Administered 2020-10-08 – 2020-10-09 (×2): 5 [IU] via SUBCUTANEOUS
  Administered 2020-10-09: 8 [IU] via SUBCUTANEOUS
  Administered 2020-10-09 – 2020-10-10 (×2): 3 [IU] via SUBCUTANEOUS
  Administered 2020-10-10: 11 [IU] via SUBCUTANEOUS
  Administered 2020-10-10: 5 [IU] via SUBCUTANEOUS
  Administered 2020-10-11: 2 [IU] via SUBCUTANEOUS
  Administered 2020-10-11 – 2020-10-12 (×3): 5 [IU] via SUBCUTANEOUS
  Administered 2020-10-12: 8 [IU] via SUBCUTANEOUS
  Administered 2020-10-13: 3 [IU] via SUBCUTANEOUS
  Administered 2020-10-13: 2 [IU] via SUBCUTANEOUS

## 2020-10-08 MED ORDER — PHENOL 1.4 % MT LIQD: 1.0000 | OROMUCOSAL | Status: DC | PRN

## 2020-10-08 MED ORDER — ALBUMIN HUMAN 5 % IV SOLN: INTRAVENOUS | Status: DC | PRN

## 2020-10-08 MED ORDER — ONDANSETRON HCL 4 MG PO TABS
4.0000 mg | ORAL_TABLET | Freq: Four times a day (QID) | ORAL | Status: DC | PRN
  Administered 2020-10-10 (×3): 4 mg via ORAL
  Filled 2020-10-08 (×3): qty 1

## 2020-10-08 MED ORDER — ACETAMINOPHEN 10 MG/ML IV SOLN
1000.0000 mg | Freq: Four times a day (QID) | INTRAVENOUS | Status: DC
  Administered 2020-10-08: 1000 mg via INTRAVENOUS
  Filled 2020-10-08: qty 100

## 2020-10-08 MED ORDER — PROPOFOL 10 MG/ML IV BOLUS
INTRAVENOUS | Status: DC | PRN
  Administered 2020-10-08: 150 mg via INTRAVENOUS

## 2020-10-08 MED ORDER — LIDOCAINE 2% (20 MG/ML) 5 ML SYRINGE
INTRAMUSCULAR | Status: AC
  Filled 2020-10-08: qty 5

## 2020-10-08 MED ORDER — FENTANYL CITRATE (PF) 250 MCG/5ML IJ SOLN
INTRAMUSCULAR | Status: AC
  Filled 2020-10-08: qty 5

## 2020-10-08 MED ORDER — FENTANYL CITRATE (PF) 100 MCG/2ML IJ SOLN
INTRAMUSCULAR | Status: DC | PRN
  Administered 2020-10-08 (×2): 50 ug via INTRAVENOUS
  Administered 2020-10-08: 25 ug via INTRAVENOUS
  Administered 2020-10-08: 50 ug via INTRAVENOUS
  Administered 2020-10-08: 25 ug via INTRAVENOUS
  Administered 2020-10-08: 50 ug via INTRAVENOUS

## 2020-10-08 MED ORDER — SODIUM CHLORIDE 0.9 % IV SOLN: INTRAVENOUS | Status: DC

## 2020-10-08 MED ORDER — POLYETHYLENE GLYCOL 3350 17 G PO PACK
17.0000 g | PACK | Freq: Every day | ORAL | Status: DC | PRN
Start: 2020-10-08 — End: 2020-10-13

## 2020-10-08 MED ORDER — METHOCARBAMOL 500 MG IVPB - SIMPLE MED
INTRAVENOUS | Status: AC
  Filled 2020-10-08: qty 50

## 2020-10-08 MED ORDER — GLIPIZIDE 10 MG PO TABS
10.0000 mg | ORAL_TABLET | Freq: Two times a day (BID) | ORAL | Status: DC
  Administered 2020-10-09 – 2020-10-13 (×9): 10 mg via ORAL
  Filled 2020-10-08 (×9): qty 1

## 2020-10-08 MED ORDER — METOCLOPRAMIDE HCL 5 MG PO TABS: 5.0000 mg | ORAL_TABLET | Freq: Three times a day (TID) | ORAL | Status: DC | PRN

## 2020-10-08 SURGICAL SUPPLY — 50 items
BAG DECANTER FOR FLEXI CONT (MISCELLANEOUS) IMPLANT
BAG ZIPLOCK 12X15 (MISCELLANEOUS) IMPLANT
BALL HIP CERAMIC (Hips) ×1 IMPLANT
BLADE SAG 18X100X1.27 (BLADE) ×2 IMPLANT
BUR OVAL CARBIDE 4.0 (BURR) ×2 IMPLANT
COVER PERINEAL POST (MISCELLANEOUS) ×2 IMPLANT
COVER SURGICAL LIGHT HANDLE (MISCELLANEOUS) ×2 IMPLANT
COVER WAND RF STERILE (DRAPES) IMPLANT
CUP ACETBLR 52 OD PINNACLE (Hips) ×2 IMPLANT
DRAPE STERI IOBAN 125X83 (DRAPES) ×2 IMPLANT
DRAPE U-SHAPE 47X51 STRL (DRAPES) ×4 IMPLANT
DRSG ADAPTIC 3X8 NADH LF (GAUZE/BANDAGES/DRESSINGS) ×2 IMPLANT
DRSG AQUACEL AG ADV 3.5X10 (GAUZE/BANDAGES/DRESSINGS) ×2 IMPLANT
DURAPREP 26ML APPLICATOR (WOUND CARE) ×2 IMPLANT
ELECT REM PT RETURN 15FT ADLT (MISCELLANEOUS) ×2 IMPLANT
GLOVE SRG 8 PF TXTR STRL LF DI (GLOVE) ×3 IMPLANT
GLOVE SURG ENC MOIS LTX SZ6.5 (GLOVE) ×2 IMPLANT
GLOVE SURG ENC MOIS LTX SZ8 (GLOVE) ×6 IMPLANT
GLOVE SURG ENC TEXT LTX SZ7 (GLOVE) IMPLANT
GLOVE SURG UNDER POLY LF SZ7 (GLOVE) ×2 IMPLANT
GLOVE SURG UNDER POLY LF SZ8 (GLOVE) ×3
GOWN STRL REUS W/TWL LRG LVL3 (GOWN DISPOSABLE) ×4 IMPLANT
GOWN STRL REUS W/TWL XL LVL3 (GOWN DISPOSABLE) IMPLANT
HIP BALL CERAMIC (Hips) ×2 IMPLANT
HOLDER FOLEY CATH W/STRAP (MISCELLANEOUS) ×2 IMPLANT
KIT BASIN OR (CUSTOM PROCEDURE TRAY) ×2 IMPLANT
KIT TURNOVER KIT A (KITS) ×2 IMPLANT
LINER MARATHON NEUT +4X52X32 (Hips) ×2 IMPLANT
MANIFOLD NEPTUNE II (INSTRUMENTS) ×2 IMPLANT
NS IRRIG 1000ML POUR BTL (IV SOLUTION) ×2 IMPLANT
PACK ANTERIOR HIP CUSTOM (KITS) ×2 IMPLANT
PACK TOTAL JOINT (CUSTOM PROCEDURE TRAY) ×2 IMPLANT
PENCIL SMOKE EVACUATOR (MISCELLANEOUS) IMPLANT
PENCIL SMOKE EVACUATOR COATED (MISCELLANEOUS) ×2 IMPLANT
PROTECTOR NERVE ULNAR (MISCELLANEOUS) ×2 IMPLANT
STAPLER VISISTAT 35W (STAPLE) IMPLANT
STEM FEMORAL SZ 5MM STD ACTIS (Stem) ×2 IMPLANT
STRIP CLOSURE SKIN 1/2X4 (GAUZE/BANDAGES/DRESSINGS) ×2 IMPLANT
SUT ETHIBOND NAB CT1 #1 30IN (SUTURE) ×2 IMPLANT
SUT MNCRL AB 4-0 PS2 18 (SUTURE) ×2 IMPLANT
SUT STRATAFIX 0 PDS 27 VIOLET (SUTURE) ×4
SUT STRATAFIX PDS+ 0 24IN (SUTURE) ×2 IMPLANT
SUT VIC AB 2-0 CT1 27 (SUTURE) ×3
SUT VIC AB 2-0 CT1 TAPERPNT 27 (SUTURE) ×3 IMPLANT
SUTURE STRATFX 0 PDS 27 VIOLET (SUTURE) ×2 IMPLANT
SYR 50ML LL SCALE MARK (SYRINGE) IMPLANT
TOWEL OR 17X26 10 PK STRL BLUE (TOWEL DISPOSABLE) ×2 IMPLANT
TRAY FOLEY MTR SLVR 16FR STAT (SET/KITS/TRAYS/PACK) ×2 IMPLANT
TUBE SUCTION HIGH CAP CLEAR NV (SUCTIONS) ×2 IMPLANT
WATER STERILE IRR 1000ML POUR (IV SOLUTION) ×2 IMPLANT

## 2020-10-08 NOTE — Discharge Instructions (Signed)
Frank Aluisio, MD Total Joint Specialist [facility] [address] [place], [place] [address] [phone]  ANTERIOR APPROACH TOTAL HIP REPLACEMENT POSTOPERATIVE DIRECTIONS     Hip Rehabilitation, Guidelines Following Surgery  The results of a hip operation are greatly improved after range of motion and muscle strengthening exercises. Follow all safety measures which are given to protect your hip. If any of these exercises cause increased pain or swelling in your joint, decrease the amount until you are comfortable again. Then slowly increase the exercises. Call your caregiver if you have problems or questions.   HOME CARE INSTRUCTIONS  . Remove items at home which could result in a fall. This includes throw rugs or furniture in walking pathways.   ICE to the affected hip as frequently as 20-30 minutes an hour and then as needed for pain and swelling. Continue to use ice on the hip for pain and swelling from surgery. You may notice swelling that will progress down to the foot and ankle. This is normal after surgery. Elevate the leg when you are not up walking on it.    Continue to use the breathing machine which will help keep your temperature down.  It is common for your temperature to cycle up and down following surgery, especially at night when you are not up moving around and exerting yourself.  The breathing machine keeps your lungs expanded and your temperature down.  DIET You may resume your previous home diet once your are discharged from the hospital.  DRESSING / WOUND CARE / SHOWERING . You have an adhesive waterproof bandage over the incision. Leave this in place until your first follow-up appointment. Once you remove this you will not need to place another bandage.  . You may begin showering 3 days following surgery, but do not submerge the incision under water.  ACTIVITY . For the first 3-5 days, it is important to rest and keep the operative  leg elevated. You should, as a general rule, rest for 50 minutes and walk/stretch for 10 minutes per hour. After 5 days, you may slowly increase activity as tolerated.  . Perform the exercises you were provided twice a day for about 15-20 minutes each session. Begin these 2 days following surgery. . Walk with your walker as instructed. Use the walker until you are comfortable transitioning to a cane. Walk with the cane in the opposite hand of the operative leg. You may discontinue the cane once you are comfortable and walking steadily. . Avoid periods of inactivity such as sitting longer than an hour when not asleep. This helps prevent blood clots.  . Do not drive a car for 6 weeks or until released by your surgeon.  . Do not drive while taking narcotics.  TED HOSE STOCKINGS Wear the elastic stockings on both legs for three weeks following surgery during the day. You may remove them at night while sleeping.  WEIGHT BEARING Weight bearing as tolerated with assist device (walker, cane, etc) as directed, use it as long as suggested by your surgeon or therapist, typically at least 4-6 weeks.  POSTOPERATIVE CONSTIPATION PROTOCOL Constipation - defined medically as fewer than three stools per week and severe constipation as less than one stool per week.  One of the most common issues patients have following surgery is constipation.  Even if you have a regular bowel pattern at home, your normal regimen is likely to be disrupted due to multiple reasons following surgery.  Combination of anesthesia, postoperative narcotics,   change in appetite and fluid intake all can affect your bowels.  In order to avoid complications following surgery, here are some recommendations in order to help you during your recovery period.  . Colace (docusate) - Pick up an over-the-counter form of Colace or another stool softener and take twice a day as long as you are requiring postoperative pain medications.  Take with a full  glass of water daily.  If you experience loose stools or diarrhea, hold the colace until you stool forms back up.  If your symptoms do not get better within 1 week or if they get worse, check with your doctor. . Dulcolax (bisacodyl) - Pick up over-the-counter and take as directed by the product packaging as needed to assist with the movement of your bowels.  Take with a full glass of water.  Use this product as needed if not relieved by Colace only.  . MiraLax (polyethylene glycol) - Pick up over-the-counter to have on hand.  MiraLax is a solution that will increase the amount of water in your bowels to assist with bowel movements.  Take as directed and can mix with a glass of water, juice, soda, coffee, or tea.  Take if you go more than two days without a movement.Do not use MiraLax more than once per day. Call your doctor if you are still constipated or irregular after using this medication for 7 days in a row.  If you continue to have problems with postoperative constipation, please contact the office for further assistance and recommendations.  If you experience "the worst abdominal pain ever" or develop nausea or vomiting, please contact the office immediatly for further recommendations for treatment.  ITCHING  If you experience itching with your medications, try taking only a single pain pill, or even half a pain pill at a time.  You can also use Benadryl over the counter for itching or also to help with sleep.   MEDICATIONS See your medication summary on the "After Visit Summary" that the nursing staff will review with you prior to discharge.  You may have some home medications which will be placed on hold until you complete the course of blood thinner medication.  It is important for you to complete the blood thinner medication as prescribed by your surgeon.  Continue your approved medications as instructed at time of discharge.  PRECAUTIONS If you experience chest pain or shortness of breath -  call 911 immediately for transfer to the hospital emergency department.  If you develop a fever greater that 101 F, purulent drainage from wound, increased redness or drainage from wound, foul odor from the wound/dressing, or calf pain - CONTACT YOUR SURGEON.                                                   FOLLOW-UP APPOINTMENTS Make sure you keep all of your appointments after your operation with your surgeon and caregivers. You should call the office at the above phone number and make an appointment for approximately two weeks after the date of your surgery or on the date instructed by your surgeon outlined in the "After Visit Summary".  RANGE OF MOTION AND STRENGTHENING EXERCISES  These exercises are designed to help you keep full movement of your hip joint. Follow your caregiver's or physical therapist's instructions. Perform all exercises about fifteen times, three   times per day or as directed. Exercise both hips, even if you have had only one joint replacement. These exercises can be done on a training (exercise) mat, on the floor, on a table or on a bed. Use whatever works the best and is most comfortable for you. Use music or television while you are exercising so that the exercises are a pleasant break in your day. This will make your life better with the exercises acting as a break in routine you can look forward to.  . Lying on your back, slowly slide your foot toward your buttocks, raising your knee up off the floor. Then slowly slide your foot back down until your leg is straight again.  . Lying on your back spread your legs as far apart as you can without causing discomfort.  . Lying on your side, raise your upper leg and foot straight up from the floor as far as is comfortable. Slowly lower the leg and repeat.  . Lying on your back, tighten up the muscle in the front of your thigh (quadriceps muscles). You can do this by keeping your leg straight and trying to raise your heel off the  floor. This helps strengthen the largest muscle supporting your knee.  . Lying on your back, tighten up the muscles of your buttocks both with the legs straight and with the knee bent at a comfortable angle while keeping your heel on the floor.   IF YOU ARE TRANSFERRED TO A SKILLED REHAB FACILITY If the patient is transferred to a skilled rehab facility following release from the hospital, a list of the current medications will be sent to the facility for the patient to continue.  When discharged from the skilled rehab facility, please have the facility set up the patient's Home Health Physical Therapy prior to being released. Also, the skilled facility will be responsible for providing the patient with their medications at time of release from the facility to include their pain medication, the muscle relaxants, and their blood thinner medication. If the patient is still at the rehab facility at time of the two week follow up appointment, the skilled rehab facility will also need to assist the patient in arranging follow up appointment in our office and any transportation needs.  MAKE SURE YOU:  . Understand these instructions.  . Get help right away if you are not doing well or get worse.    DENTAL ANTIBIOTICS:  In most cases prophylactic antibiotics for Dental procdeures after total joint surgery are not necessary.  Exceptions are as follows:  1. History of prior total joint infection  2. Severely immunocompromised (Organ Transplant, cancer chemotherapy, Rheumatoid biologic meds such as Humera)  3. Poorly controlled diabetes (A1C &gt; 8.0, blood glucose over 200)  If you have one of these conditions, contact your surgeon for an antibiotic prescription, prior to your dental procedure.    Pick up stool softner and laxative for home use following surgery while on pain medications. Do not submerge incision under water. Please use good hand washing techniques while changing dressing each  day. May shower starting three days after surgery. Please use a clean towel to pat the incision dry following showers. Continue to use ice for pain and swelling after surgery. Do not use any lotions or creams on the incision until instructed by your surgeon.  

## 2020-10-08 NOTE — Plan of Care (Signed)
  Problem: Education: Goal: Knowledge of the prescribed therapeutic regimen will improve Outcome: Progressing Goal: Understanding of discharge needs will improve Outcome: Progressing   Problem: Education: Goal: Understanding of discharge needs will improve Outcome: Progressing   Problem: Education: Goal: Knowledge of the prescribed therapeutic regimen will improve Outcome: Progressing

## 2020-10-08 NOTE — Transfer of Care (Signed)
Immediate Anesthesia Transfer of Care Note  Patient: Jason Huang  Procedure(s) Performed: TOTAL HIP ARTHROPLASTY ANTERIOR APPROACH (Left Hip) HARDWARE REMOVAL (Left Hip)  Patient Location: PACU  Anesthesia Type:General  Level of Consciousness: awake, alert , oriented and patient cooperative  Airway & Oxygen Therapy: Patient Spontanous Breathing and Patient connected to face mask oxygen  Post-op Assessment: Report given to RN and Post -op Vital signs reviewed and stable  Post vital signs: Reviewed and stable  Last Vitals:  Vitals Value Taken Time  BP    Temp    Pulse    Resp    SpO2      Last Pain:  Vitals:   10/08/20 0615  TempSrc:   PainSc: 0-No pain         Complications: No complications documented.

## 2020-10-08 NOTE — Anesthesia Procedure Notes (Signed)
Procedure Name: Intubation Date/Time: 10/08/2020 7:26 AM Performed by: Garth Bigness, CRNA Pre-anesthesia Checklist: Patient identified, Emergency Drugs available, Suction available and Patient being monitored Patient Re-evaluated:Patient Re-evaluated prior to induction Oxygen Delivery Method: Circle system utilized Preoxygenation: Pre-oxygenation with 100% oxygen Induction Type: IV induction Ventilation: Mask ventilation without difficulty Laryngoscope Size: Glidescope and 3 Grade View: Grade II Tube type: Oral Tube size: 7.5 mm Number of attempts: 1 Airway Equipment and Method: Oral airway,  Video-laryngoscopy and Rigid stylet Placement Confirmation: ETT inserted through vocal cords under direct vision,  positive ETCO2 and breath sounds checked- equal and bilateral Secured at: 23 cm Tube secured with: Tape Dental Injury: Teeth and Oropharynx as per pre-operative assessment  Difficulty Due To: Difficulty was anticipated, Difficult Airway- due to large tongue and Difficult Airway- due to reduced neck mobility

## 2020-10-08 NOTE — Op Note (Signed)
OPERATIVE REPORT- TOTAL HIP ARTHROPLASTY   PREOPERATIVE DIAGNOSIS: Osteoarthritis of the Left hip.   POSTOPERATIVE DIAGNOSIS: Osteoarthritis of the Left  hip.   PROCEDURE:  Conversion of previous hip surgery to Left Total Hip Arthroplasty  SURGEON: Ollen Gross, MD   ASSISTANT: Arther Abbott, PA-C  ANESTHESIA:  General  ESTIMATED BLOOD LOSS:-1350 mL    DRAINS: Hemovac x1.   COMPLICATIONS: None   CONDITION: PACU - hemodynamically stable.   BRIEF CLINICAL NOTE: Jason Huang is a 70 y.o. 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   PROCEDURE IN DETAIL: After successful administration of spinal  anesthetic, the traction boots for the [facility] bed were placed on both  feet and the patient was placed onto the [facility] bed, boots placed into the leg  holders. The Left hip was then isolated from the perineum with plastic  drapes and prepped and draped in the usual sterile fashion.      The patients previous lateral based incision was utilized and the skin cut with a  10 blade through the subcutaneous tissue to the fascia lata which is incised in line  with the skin incision.The 4 screws are identified in the lateral femur and the screw  heads were partially overgrown with bone. I removed the bone covering the screw  heads with a burr to expose them. They appeared to be Knowles pins but the Knowles  pins extractor did not fit the pin heads. We tried numerous extraction techniques and I was  able to remove the screws with a vice grip . The wound bed was packed with a moist sponge  while the hip approach began.      ASIS and greater trochanter were marked and a oblique incision was made, starting  at about 1 cm lateral and 2 cm distal to the ASIS and coursing towards  the  anterior cortex of the femur. The skin was cut with a 10 blade  through subcutaneous tissue to the level of the fascia overlying the  tensor fascia lata muscle. The fascia was then incised in line with the  incision at the junction of the anterior third and posterior 2/3rd. The  muscle was teased off the fascia and then the interval between the TFL  and the rectus was developed. The Hohmann retractor was then placed at  the top of the femoral neck over the capsule. The vessels overlying the  capsule were cauterized and the fat on top of the capsule was removed.  A Hohmann retractor was then placed anterior underneath the rectus  femoris to give exposure to the entire anterior capsule. A T-shaped  capsulotomy was performed. The edges were tagged and the femoral head  was identified.       Osteophytes are removed off the superior acetabulum.  The femoral neck was then cut in situ with an oscillating saw. Traction  was then applied to the left lower extremity utilizing the [facility]  traction. The femoral head was then removed. Retractors were placed  around the acetabulum and then circumferential removal of the labrum was  performed. Osteophytes were also removed. Reaming starts at 45 mm to  medialize and  Increased in 2 mm increments to 51 mm. We reamed in  approximately 40 degrees of abduction, 20 degrees anteversion. A 52 mm  pinnacle acetabular shell was then impacted in anatomic position under  fluoroscopic guidance with excellent purchase. We did not need to place  any additional dome screws. A 32 mm neutral + 4 marathon liner was then  placed into the acetabular shell.       The femoral lift was then placed along the lateral aspect of the femur  just distal to the vastus ridge. The leg was  externally rotated and capsule  was stripped off the inferior aspect of the femoral neck down to the  level of the lesser trochanter, this was done with electrocautery. The femur was lifted after this  was performed. The  leg was then placed in an extended and adducted position essentially delivering the femur. We also removed the capsule superiorly and the piriformis from the piriformis fossa to gain excellent exposure of the  proximal femur. Rongeur was used to remove some cancellous bone to get  into the lateral portion of the proximal femur for placement of the  initial starter reamer. The starter broaches was placed  the starter broach  and was shown to go down the center of the canal. Broaching  with the Actis system was then performed starting at size 0  coursing  Up to size 5. A size 5 had excellent torsional and rotational  and axial stability. The trial standard offset neck was then placed  with a 32 = 5 trial head. The hip was then reduced. We confirmed that  the stem was in the canal both on AP and lateral x-rays. It also has excellent sizing. The hip was reduced with outstanding stability through full extension and full external rotation.. AP pelvis was taken and the leg lengths were measured and found to be equal. Hip was then dislocated again and the femoral head and neck removed. The  femoral broach was removed. Size 5 Actis stem with a standard offset  neck was then impacted into the femur following native anteversion. Has  excellent purchase in the canal. Excellent torsional and rotational and  axial stability. It is confirmed to be in the canal on AP and lateral  fluoroscopic views. The 32 + 5 ceramic head was placed and the hip  reduced with outstanding stability. Again AP pelvis was taken and it  confirmed that the leg lengths were equal. The wound was then copiously  irrigated with saline solution and the capsule reattached and repaired  with Ethibond suture. 30 ml of .25% Bupivicaine was  injected into the capsule and into the edge of the tensor fascia lata as well as subcutaneous tissue. The fascia overlying the tensor fascia lata was then closed with a running #1 V-Loc.  Subcu was closed with interrupted 2-0 Vicryl and subcuticular running 4-0 Monocryl. Incision was cleaned  and dried. Steri-Strips and a bulky sterile dressing applied. The patient was awakened and transported to  recovery in stable condition.        Please note that a surgical assistant was a medical necessity for this procedure to perform it in a safe and expeditious manner. Assistant was necessary to provide appropriate retraction of vital neurovascular structures and to prevent femoral fracture and allow for anatomic placement of the prosthesis.  Ollen Gross, M.D.

## 2020-10-08 NOTE — Interval H&P Note (Signed)
History and Physical Interval Note:  10/08/2020 6:24 AM  Jason Huang  has presented today for surgery, with the diagnosis of Left hip osteoarthritis,RETAINED HARDWARE.  The various methods of treatment have been discussed with the patient and family. After consideration of risks, benefits and other options for treatment, the patient has consented to  Procedure(s) with comments: TOTAL HIP ARTHROPLASTY ANTERIOR APPROACH (Left) - HARDWARE REMOVAL (N/A) as a surgical intervention.  The patient's history has been reviewed, patient examined, no change in status, stable for surgery.  I have reviewed the patient's chart and labs.  Questions were answered to the patient's satisfaction.     Homero Fellers Tala Eber

## 2020-10-08 NOTE — Anesthesia Postprocedure Evaluation (Signed)
Anesthesia Post Note  Patient: Jason Huang  Procedure(s) Performed: TOTAL HIP ARTHROPLASTY ANTERIOR APPROACH (Left Hip) HARDWARE REMOVAL (Left Hip)     Patient location during evaluation: PACU Anesthesia Type: General Level of consciousness: awake and alert Pain management: pain level controlled Vital Signs Assessment: post-procedure vital signs reviewed and stable Respiratory status: spontaneous breathing, nonlabored ventilation, respiratory function stable and patient connected to nasal cannula oxygen Cardiovascular status: blood pressure returned to baseline and stable Postop Assessment: no apparent nausea or vomiting Anesthetic complications: no   No complications documented.  Last Vitals:  Vitals:   10/08/20 1245 10/08/20 1308  BP: 133/61 (!) 165/67  Pulse: 74 76  Resp: 17 16  Temp:  36.7 C  SpO2: 95% 96%    Last Pain:  Vitals:   10/08/20 1308  TempSrc: Oral  PainSc:                  Dore Oquin,W. EDMOND

## 2020-10-08 NOTE — Evaluation (Signed)
Physical Therapy Evaluation Patient Details Name: Jason Huang MRN: [id number] DOB: July 21, 1950 Today's Date: 10/08/2020   History of Present Illness  patient is a 70 y.o. male s/p Conversion of previous hip surgery to Left Total Hip Arthroplasty on 10/08/2020 with PMH significant for neuropathy, HTN, a-fib, DM, depression, anxiety, OA, CKD, and back surgery.  Clinical Impression  Pt is a 70y.o. male s/p conversion of previous hip surgery to Lt THA POD 0. Pt reports that he is modified independent with use of SPC for mobility at baseline. Pt required MIN assist for power up to stand from EOB with cues for safe hand placement. Pt required MIN- MOD assist for safety with ambulation 54ft with verbal cues for RW management and step to gait pattern with no LOB. Pt's UE/ LEs shaking/trembling with reported fatigue and pt breathing heavily and grunting with some SOB throughout mobility. Pt's O2 sat lowest 82% on RA, HR in 70-80's during session. Pt placed back on 3L [place] at EOS, O2 sat at 93%. PT reviewed therapeutic intervention for promotion of DVT prevention, pt demonstrated understanding. Pt will have assistance from his wife and other family members upon discharge. Pt will benefit from skilled PT to increase independence and safety with mobility. Acute therapy to follow up during stay to progress functional mobility as able to ensure safe discharge home.       Follow Up Recommendations Follow surgeon's recommendation for DC plan and follow-up therapies;Home health PT    Equipment Recommendations  None recommended by PT (pt owns RW)    Recommendations for Other Services       Precautions / Restrictions Precautions Precautions: Fall Restrictions Weight Bearing Restrictions: No Other Position/Activity Restrictions: WBAT      Mobility  Bed Mobility Overal bed mobility: Needs Assistance Bed Mobility: Supine to Sit     Supine to sit: Min assist;+2 for physical assistance;+2 for  safety/equipment;HOB elevated     General bed mobility comments: MIN assist +2 for progression of Lt LE and trunk to upright with cues for use of bed rail and hand placement.    Transfers Overall transfer level: Needs assistance Equipment used: Rolling walker (2 wheeled) Transfers: Sit to/from Stand Sit to Stand: Min assist         General transfer comment: MIN assist for power up to stand with use of B UEs and extra time 2/2 pain.  Ambulation/Gait Ambulation/Gait assistance: Min assist;Mod assist Gait Distance (Feet): 4 Feet Assistive device: Rolling walker (2 wheeled) Gait Pattern/deviations: Step-to pattern;Decreased stride length;Decreased weight shift to left     General Gait Details: pt performed pre gait marching with no LOB and use of B UEs on RW. Pt requiring MOD assist for stability and demonstrated increased lateral lean when WB on Lt LE. Pt's UE/ LEs shaking with reported fatigue and pt breathing heavily and grunting with some SOB throughout mobility. Pt reports he has noticed the onset of heavy breathing in past 2-3 wks. Pt's O2 sat lowest 82% on RA, HR in 70-80's. Pt placed back on 3L [place] at EOS, O2 sat at 93%, RN aware.  Stairs            Wheelchair Mobility    Modified Rankin (Stroke Patients Only)       Balance Overall balance assessment: Needs assistance Sitting-balance support: Feet supported;Bilateral upper extremity supported Sitting balance-Leahy Scale: Poor Sitting balance - Comments: pt requiring B UE support in sitting and displayed lateral lean to Rt to offload Lt hip.   Standing  balance support: Bilateral upper extremity supported;During functional activity Standing balance-Leahy Scale: Poor Standing balance comment: use of RW and assist from therapist to maintain standing balance                             Pertinent Vitals/Pain Pain Assessment: 0-10 Pain Score: 9  Pain Location: Lt hip Pain Descriptors / Indicators:  Grimacing;Discomfort;Sore Pain Intervention(s): Limited activity within patient's tolerance;Monitored during session;Repositioned;Ice applied    Home Living Family/patient expects to be discharged to:: Private residence Living Arrangements: Spouse/significant other Available Help at Discharge: Family Type of Home: House Home Access: Ramped entrance     Home Layout: One level Home Equipment: Cane - single point;Walker - 2 wheels;Walker - 4 wheels Additional Comments: wife will assist at home and family members live close by    Prior Function Level of Independence: Independent with assistive device(s)         Comments: use of SPC     Hand Dominance   Dominant Hand: Right    Extremity/Trunk Assessment   Upper Extremity Assessment Upper Extremity Assessment: Generalized weakness    Lower Extremity Assessment Lower Extremity Assessment: LLE deficits/detail LLE Deficits / Details: pt with good quad set strength and 4/5 B dorsi/plantar flexion strength. LLE Sensation: WNL LLE Coordination: WNL    Cervical / Trunk Assessment Cervical / Trunk Assessment: Normal  Communication   Communication: No difficulties  Cognition Arousal/Alertness: Awake/alert Behavior During Therapy: WFL for tasks assessed/performed Overall Cognitive Status: Within Functional Limits for tasks assessed                                        General Comments      Exercises Total Joint Exercises Ankle Circles/Pumps: AROM;Both;20 reps;Seated   Assessment/Plan    PT Assessment Patient needs continued PT services  PT Problem List Decreased strength;Decreased range of motion;Decreased activity tolerance;Decreased balance;Decreased mobility;Decreased knowledge of use of DME;Pain       PT Treatment Interventions DME instruction;Gait training;Therapeutic activities;Therapeutic exercise;Functional mobility training;Balance training;Patient/family education    PT Goals (Current  goals can be found in the Care Plan section)  Acute Rehab PT Goals Patient Stated Goal: be able to walk around and maintain his land PT Goal Formulation: With patient Time For Goal Achievement: 10/15/20 Potential to Achieve Goals: Good    Frequency 7X/week   Barriers to discharge        Co-evaluation               AM-PAC PT "6 Clicks" Mobility  Outcome Measure Help needed turning from your back to your side while in a flat bed without using bedrails?: A Little Help needed moving from lying on your back to sitting on the side of a flat bed without using bedrails?: A Little Help needed moving to and from a bed to a chair (including a wheelchair)?: A Lot Help needed standing up from a chair using your arms (e.g., wheelchair or bedside chair)?: A Little Help needed to walk in hospital room?: A Lot Help needed climbing 3-5 steps with a railing? : A Lot 6 Click Score: 15    End of Session Equipment Utilized During Treatment: Gait belt Activity Tolerance: Patient limited by fatigue;Patient limited by pain Patient left: in chair;with call bell/phone within reach;with chair alarm set Nurse Communication: Mobility status (pt O2 sats and heavy grunting/breathing) PT  Visit Diagnosis: Unsteadiness on feet (R26.81);Muscle weakness (generalized) (M62.81);Pain Pain - Right/Left: Left Pain - part of body: Hip    Time: 2094-7096 PT Time Calculation (min) (ACUTE ONLY): 31 min   Charges:              Loyal Gambler, SPT  Acute rehab    Loyal Gambler 10/08/2020, 5:17 PM

## 2020-10-08 NOTE — Care Plan (Signed)
Ortho Bundle Case Management Note  Patient Details  Name: Jason Huang MRN: [id number] Date of Birth: 08/20/1950                  L THA on 10/08/20. DCP: Home with wife. 1 story home with 5 steps. Has ramp entrance in garage. DME: No needs. Has RW & elevated toilets. PT: HEP   DME Arranged:  N/A DME Agency:     HH Arranged:    HH Agency:     Additional Comments: Please contact me with any questions of if this plan should need to change.  Ennis Forts, RN,CCM [facility]  [phone] 10/08/2020, 10:39 AM

## 2020-10-09 ENCOUNTER — Encounter (HOSPITAL_COMMUNITY): Payer: Self-pay | Admitting: Orthopedic Surgery

## 2020-10-09 DIAGNOSIS — D631 Anemia in chronic kidney disease: Secondary | ICD-10-CM | POA: Diagnosis present

## 2020-10-09 DIAGNOSIS — I130 Hypertensive heart and chronic kidney disease with heart failure and stage 1 through stage 4 chronic kidney disease, or unspecified chronic kidney disease: Secondary | ICD-10-CM | POA: Diagnosis present

## 2020-10-09 DIAGNOSIS — G4730 Sleep apnea, unspecified: Secondary | ICD-10-CM | POA: Diagnosis present

## 2020-10-09 DIAGNOSIS — Z880 Allergy status to penicillin: Secondary | ICD-10-CM | POA: Diagnosis not present

## 2020-10-09 DIAGNOSIS — F419 Anxiety disorder, unspecified: Secondary | ICD-10-CM | POA: Diagnosis present

## 2020-10-09 DIAGNOSIS — Z96649 Presence of unspecified artificial hip joint: Secondary | ICD-10-CM | POA: Diagnosis present

## 2020-10-09 DIAGNOSIS — Z888 Allergy status to other drugs, medicaments and biological substances status: Secondary | ICD-10-CM | POA: Diagnosis not present

## 2020-10-09 DIAGNOSIS — Z882 Allergy status to sulfonamides status: Secondary | ICD-10-CM | POA: Diagnosis not present

## 2020-10-09 DIAGNOSIS — Z807 Family history of other malignant neoplasms of lymphoid, hematopoietic and related tissues: Secondary | ICD-10-CM | POA: Diagnosis not present

## 2020-10-09 DIAGNOSIS — N189 Chronic kidney disease, unspecified: Secondary | ICD-10-CM | POA: Diagnosis present

## 2020-10-09 DIAGNOSIS — Z79899 Other long term (current) drug therapy: Secondary | ICD-10-CM | POA: Diagnosis not present

## 2020-10-09 DIAGNOSIS — Z7901 Long term (current) use of anticoagulants: Secondary | ICD-10-CM | POA: Diagnosis not present

## 2020-10-09 DIAGNOSIS — G8929 Other chronic pain: Secondary | ICD-10-CM | POA: Diagnosis present

## 2020-10-09 DIAGNOSIS — Z96642 Presence of left artificial hip joint: Secondary | ICD-10-CM | POA: Diagnosis present

## 2020-10-09 DIAGNOSIS — Z79891 Long term (current) use of opiate analgesic: Secondary | ICD-10-CM | POA: Diagnosis not present

## 2020-10-09 DIAGNOSIS — Z794 Long term (current) use of insulin: Secondary | ICD-10-CM | POA: Diagnosis not present

## 2020-10-09 DIAGNOSIS — F32A Depression, unspecified: Secondary | ICD-10-CM | POA: Diagnosis present

## 2020-10-09 DIAGNOSIS — Z800 Family history of malignant neoplasm of digestive organs: Secondary | ICD-10-CM | POA: Diagnosis not present

## 2020-10-09 DIAGNOSIS — M1612 Unilateral primary osteoarthritis, left hip: Secondary | ICD-10-CM | POA: Diagnosis present

## 2020-10-09 DIAGNOSIS — E1122 Type 2 diabetes mellitus with diabetic chronic kidney disease: Secondary | ICD-10-CM | POA: Diagnosis present

## 2020-10-09 DIAGNOSIS — I4891 Unspecified atrial fibrillation: Secondary | ICD-10-CM | POA: Diagnosis present

## 2020-10-09 DIAGNOSIS — Z87891 Personal history of nicotine dependence: Secondary | ICD-10-CM | POA: Diagnosis not present

## 2020-10-09 LAB — CBC
HCT: 30.9 % — ABNORMAL LOW (ref 39.0–52.0)
Hemoglobin: 10.2 g/dL — ABNORMAL LOW (ref 13.0–17.0)
MCH: 29.8 pg (ref 26.0–34.0)
MCHC: 33 g/dL (ref 30.0–36.0)
MCV: 90.4 fL (ref 80.0–100.0)
Platelets: 143 10*3/uL — ABNORMAL LOW (ref 150–400)
RBC: 3.42 MIL/uL — ABNORMAL LOW (ref 4.22–5.81)
RDW: 13.7 % (ref 11.5–15.5)
WBC: 9.3 10*3/uL (ref 4.0–10.5)
nRBC: 0 % (ref 0.0–0.2)

## 2020-10-09 LAB — BASIC METABOLIC PANEL
Anion gap: 7 (ref 5–15)
BUN: 34 mg/dL — ABNORMAL HIGH (ref 8–23)
CO2: 25 mmol/L (ref 22–32)
Calcium: 8.8 mg/dL — ABNORMAL LOW (ref 8.9–10.3)
Chloride: 104 mmol/L (ref 98–111)
Creatinine, Ser: 1.47 mg/dL — ABNORMAL HIGH (ref 0.61–1.24)
GFR, Estimated: 51 mL/min — ABNORMAL LOW (ref >60)
Glucose, Bld: 215 mg/dL — ABNORMAL HIGH (ref 70–99)
Potassium: 4.8 mmol/L (ref 3.5–5.1)
Sodium: 136 mmol/L (ref 135–145)

## 2020-10-09 LAB — GLUCOSE, CAPILLARY
Glucose-Capillary: 204 mg/dL — ABNORMAL HIGH (ref 70–99)
Glucose-Capillary: 199 mg/dL — ABNORMAL HIGH (ref 70–99)
Glucose-Capillary: 271 mg/dL — ABNORMAL HIGH (ref 70–99)
Glucose-Capillary: 337 mg/dL — ABNORMAL HIGH (ref 70–99)

## 2020-10-09 MED ORDER — CHLORHEXIDINE GLUCONATE CLOTH 2 % EX PADS: 6.0000 | MEDICATED_PAD | Freq: Every day | CUTANEOUS | Status: DC

## 2020-10-09 NOTE — Progress Notes (Signed)
Physical Therapy Treatment Patient Details Name: Jason Huang MRN: [id number] DOB: [birth year] Today's Date: 10/09/2020    History of Present Illness patient is a 70 y.o. male s/p Conversion of previous hip surgery to Left Total Hip Arthroplasty on 10/08/2020 with PMH significant for neuropathy, HTN, a-fib, DM, depression, anxiety, OA, CKD, and back surgery.    PT Comments    POD # 1 pm session Pt was back in bed "recliner is the worst".  Assisted to EOB pt extremely sweaty.  Assisted with a quick wash up, gown change and bed change.  Assisted with amb.  General Gait Details: able to amb an increased distance but still limited by effort.  Recliner following behind for safety. Returned to room via recliner.   Pt plans to D/C to home tomorrow .    Follow Up Recommendations  Follow surgeon's recommendation for DC plan and follow-up therapies;Home health PT     Equipment Recommendations  None recommended by PT    Recommendations for Other Services       Precautions / Restrictions Precautions Precautions: Fall Restrictions Weight Bearing Restrictions: No Other Position/Activity Restrictions: WBAT    Mobility  Bed Mobility Overal bed mobility: Needs Assistance Bed Mobility: Supine to Sit     Supine to sit: Max assist     General bed mobility comments: Pt required Max Assist with supine to sit with diffuculty upper body due to ABD girth and difficulty scooting due to hip pain    Transfers Overall transfer level: Needs assistance Equipment used: Rolling walker (2 wheeled) Transfers: Sit to/from Stand Sit to Stand: Min guard         General transfer comment: from elevated bed pt was able to rise with 25% VC's on proper hand placement and safety with turns  Ambulation/Gait Ambulation/Gait assistance: Mod assist;+2 physical assistance;+2 safety/equipment Gait Distance (Feet): 6 Feet Assistive device: Rolling walker (2 wheeled) Gait Pattern/deviations: Step-to  pattern;Decreased stride length;Decreased weight shift to left Gait velocity: decreased   General Gait Details: able to amb an increased distance but still limited by effort.  Recliner following behind for safety.   Stairs             Wheelchair Mobility    Modified Rankin (Stroke Patients Only)       Balance                                            Cognition Arousal/Alertness: Awake/alert Behavior During Therapy: WFL for tasks assessed/performed Overall Cognitive Status: Within Functional Limits for tasks assessed                                 General Comments: AxO x 3 very pleasant      Exercises      General Comments        Pertinent Vitals/Pain Pain Assessment: 0-10 Pain Score: 5  Pain Location: Lt hip Pain Descriptors / Indicators: Grimacing;Discomfort;Sore;Operative site guarding Pain Intervention(s): Premedicated before session;Monitored during session;Repositioned;Ice applied    Home Living                      Prior Function            PT Goals (current goals can now be found in the care plan section) Progress towards PT goals: Progressing toward  goals    Frequency    7X/week      PT Plan Current plan remains appropriate    Co-evaluation              AM-PAC PT "6 Clicks" Mobility   Outcome Measure  Help needed turning from your back to your side while in a flat bed without using bedrails?: A Little Help needed moving from lying on your back to sitting on the side of a flat bed without using bedrails?: A Little Help needed moving to and from a bed to a chair (including a wheelchair)?: A Lot Help needed standing up from a chair using your arms (e.g., wheelchair or bedside chair)?: A Lot Help needed to walk in hospital room?: A Lot Help needed climbing 3-5 steps with a railing? : Total 6 Click Score: 13    End of Session Equipment Utilized During Treatment: Gait belt Activity  Tolerance: Patient limited by fatigue;Patient limited by pain Patient left: in chair;with call bell/phone within reach;with chair alarm set Nurse Communication: Mobility status PT Visit Diagnosis: Unsteadiness on feet (R26.81);Muscle weakness (generalized) (M62.81);Pain Pain - Right/Left: Left Pain - part of body: Hip     Time: 2706-2376 PT Time Calculation (min) (ACUTE ONLY): 30 min  Charges:  $Gait Training: 8-22 mins $Therapeutic Activity: 8-22 mins                     Felecia Shelling  PTA Acute  Rehabilitation Services Pager      [phone] Office      [phone]

## 2020-10-09 NOTE — Progress Notes (Signed)
   Subjective: 1 Day Post-Op Procedure(s) (LRB): TOTAL HIP ARTHROPLASTY ANTERIOR APPROACH (Left) HARDWARE REMOVAL (Left) Patient reports pain as mild.   Patient seen in rounds by Dr. Lequita Halt. Patient is well, and has had no acute complaints or problems. No acute overnight events. Denies SOB, chest pain, or calf pain. Ambulated four feet with PT yesterday. Foley to be pulled this am.  We will continue therapy today.   Objective: Vital signs in last 24 hours: Temp:  [98 F (36.7 C)-99.9 F (37.7 C)] 99.7 F (37.6 C) (03/24 0554) Pulse Rate:  [65-95] 82 (03/24 0554) Resp:  [9-20] 16 (03/24 0554) BP: (123-165)/(47-92) 150/57 (03/24 0554) SpO2:  [91 %-97 %] 93 % (03/24 0554) Weight:  [111.1 kg] 111.1 kg (03/23 1330)  Intake/Output from previous day:  Intake/Output Summary (Last 24 hours) at 10/09/2020 0803 Last data filed at 10/09/2020 0500 Gross per 24 hour  Intake 4700.61 ml  Output 5275 ml  Net -574.39 ml     Intake/Output this shift: No intake/output data recorded.  Labs: Recent Labs    10/09/20 0253  HGB 10.2*   Recent Labs    10/09/20 0253  WBC 9.3  RBC 3.42*  HCT 30.9*  PLT 143*   Recent Labs    10/09/20 0253  NA 136  K 4.8  CL 104  CO2 25  BUN 34*  CREATININE 1.47*  GLUCOSE 215*  CALCIUM 8.8*   No results for input(s): LABPT, INR in the last 72 hours.  Exam: General - Patient is Alert and Oriented Extremity - Neurologically intact Neurovascular intact Intact pulses distally Dorsiflexion/Plantar flexion intact Dressing - dressing C/D/I Motor Function - intact, moving foot and toes well on exam.   Past Medical History:  Diagnosis Date  . Anxiety   . Arthritis    back, wrists elbows knees  . Chronic kidney disease    stage 3  . Complication of anesthesia    narrow airway  . Depression   . Diabetes mellitus without complication (HCC)   . Dysrhythmia    Afib  . Family history of adverse reaction to anesthesia    slow to wake up  .  Hypertension   . Neuromuscular disorder (HCC)    neuropathy in feet  . Pneumonia   . Sleep apnea    CPAP    Assessment/Plan: 1 Day Post-Op Procedure(s) (LRB): TOTAL HIP ARTHROPLASTY ANTERIOR APPROACH (Left) HARDWARE REMOVAL (Left) Principal Problem:   OA (osteoarthritis) of hip Active Problems:   Primary osteoarthritis of left hip  Estimated body mass index is 38.35 kg/m as calculated from the following:   Height as of this encounter: 5' 7.01" (1.702 m).   Weight as of this encounter: 111.1 kg. Up with therapy Plan for discharge tomorrow  DVT Prophylaxis - Xarelto and TED hose Weight bearing as tolerated. Continue therapy.  Will plan for two sessions of PT today and again tomorrow, and if meeting goals, will plan for discharge tomorrow.   Plan is to go Home after hospital stay.  Nelia Shi, MBA, PA-C Orthopedic Surgery 10/09/2020, 8:03 AM

## 2020-10-09 NOTE — Progress Notes (Signed)
Physical Therapy Treatment Patient Details Name: Jason Huang MRN: [id number] DOB: 08/28/1950 Today's Date: 10/09/2020    History of Present Illness patient is a 70 y.o. male s/p Conversion of previous hip surgery to Left Total Hip Arthroplasty on 10/08/2020 with PMH significant for neuropathy, HTN, a-fib, DM, depression, anxiety, OA, CKD, and back surgery.    PT Comments    POD # 1 am session Assisted OOB to amb required much assist and increased time.  General bed mobility comments: Pt required Max Assist with supine to sit with diffuculty upper body due to ABD girth and difficulty scooting due to hip pain.  General transfer comment: from elevated bed pt was able to rise with 25% VC's on proper hand placement and safety with turns.  General Gait Details: very limited distance of 3 feet due to effort, fatigue.  Reqyuired assist to advance L LE with each step.  "the socks makes it stuck".  Required + 2 assist such that recliner was following. Performed a few TE's followed by ICE.   Follow Up Recommendations  Follow surgeon's recommendation for DC plan and follow-up therapies;Home health PT     Equipment Recommendations  None recommended by PT    Recommendations for Other Services       Precautions / Restrictions Precautions Precautions: Fall Restrictions Weight Bearing Restrictions: No Other Position/Activity Restrictions: WBAT    Mobility  Bed Mobility Overal bed mobility: Needs Assistance Bed Mobility: Supine to Sit     Supine to sit: Max assist     General bed mobility comments: Pt required Max Assist with supine to sit with diffuculty upper body due to ABD girth and difficulty scooting due to hip pain    Transfers Overall transfer level: Needs assistance Equipment used: Rolling walker (2 wheeled) Transfers: Sit to/from Stand Sit to Stand: Min guard         General transfer comment: from elevated bed pt was able to rise with 25% VC's on proper hand placement  and safety with turns  Ambulation/Gait Ambulation/Gait assistance: Mod assist;+2 physical assistance;+2 safety/equipment Gait Distance (Feet): 3 Feet Assistive device: Rolling walker (2 wheeled) (Bariatric walker) Gait Pattern/deviations: Step-to pattern;Decreased stride length;Decreased weight shift to left Gait velocity: decreased   General Gait Details: very limited distance of 3 feet due to effort, fatigue.  Reqyuired assist to advance L LE with each step.  "the socks makes it stuck".  Required + 2 assist such that recliner was following.   Stairs             Wheelchair Mobility    Modified Rankin (Stroke Patients Only)       Balance                                            Cognition Arousal/Alertness: Awake/alert Behavior During Therapy: WFL for tasks assessed/performed Overall Cognitive Status: Within Functional Limits for tasks assessed                                 General Comments: AxO x 3 very pleasant      Exercises   Total Hip Replacement TE's following HEP Handout 10 reps ankle pumps 05 reps knee presses 05 reps heel slides 05 reps SAQ's 05 reps ABD Instructed how to use a belt loop to assist  Followed by ICE  General Comments        Pertinent Vitals/Pain Pain Assessment: 0-10 Pain Score: 5  Pain Location: Lt hip Pain Descriptors / Indicators: Grimacing;Discomfort;Sore;Operative site guarding Pain Intervention(s): Premedicated before session;Monitored during session;Repositioned;Ice applied    Home Living                      Prior Function            PT Goals (current goals can now be found in the care plan section) Progress towards PT goals: Progressing toward goals    Frequency    7X/week      PT Plan Current plan remains appropriate    Co-evaluation              AM-PAC PT "6 Clicks" Mobility   Outcome Measure  Help needed turning from your back to your side while  in a flat bed without using bedrails?: A Little Help needed moving from lying on your back to sitting on the side of a flat bed without using bedrails?: A Little Help needed moving to and from a bed to a chair (including a wheelchair)?: A Lot Help needed standing up from a chair using your arms (e.g., wheelchair or bedside chair)?: A Lot Help needed to walk in hospital room?: A Lot Help needed climbing 3-5 steps with a railing? : Total 6 Click Score: 13    End of Session Equipment Utilized During Treatment: Gait belt Activity Tolerance: Patient limited by fatigue;Patient limited by pain Patient left: in chair;with call bell/phone within reach;with chair alarm set Nurse Communication: Mobility status PT Visit Diagnosis: Unsteadiness on feet (R26.81);Muscle weakness (generalized) (M62.81);Pain Pain - Right/Left: Left Pain - part of body: Hip     Time: 1212-1250 PT Time Calculation (min) (ACUTE ONLY): 38 min  Charges:  $Gait Training: 8-22 mins $Therapeutic Activity: 23-37 mins                     Felecia Shelling  PTA Acute  Rehabilitation Services Pager      [phone] Office      [phone]

## 2020-10-09 NOTE — TOC Transition Note (Signed)
Transition of Care [facility]) - CM/SW Discharge Note   Patient Details  Name: Trooper Olander MRN: [id number] Date of Birth: [birth year]  [facility] CM/SW Contact:  Lennart Pall, LCSW Phone Number: 10/09/2020, 10:09 AM   Clinical Narrative:    Met with pt to review dc needs. Pt now reports that he does need a 3n1 commode - order placed with [employer].  Plan HEP for home.  No further TOC needs.   Final next level of care: Home/Self Care Barriers to Discharge: No Barriers Identified   Patient Goals and CMS Choice Patient states their goals for this hospitalization and ongoing recovery are:: return home      Discharge Placement                       Discharge Plan and Services                DME Arranged: 3-N-1 DME Agency: [employer] Date DME Agency Contacted: 10/09/20 Time DME Agency Contacted: 1062 Representative spoke with at DME Agency: [place] ([place]) Interventions     Readmission Risk Interventions Readmission Risk Prevention Plan 10/09/2020  Transportation Screening Complete  PCP or Specialist Appt within 5-7 Days Complete

## 2020-10-09 NOTE — Progress Notes (Signed)
Inpatient Diabetes Program Recommendations  AACE/ADA: New Consensus Statement on Inpatient Glycemic Control (2015)  Target Ranges:  Prepandial:   less than 140 mg/dL      Peak postprandial:   less than 180 mg/dL (1-2 hours)      Critically ill patients:  140 - 180 mg/dL   Lab Results  Component Value Date   GLUCAP 199 (H) 10/09/2020   HGBA1C 6.5 (H) 10/01/2020    Review of Glycemic Control  Diabetes history: DM2 Outpatient Diabetes medications: Lantus 40 units QD, glipizide 10 mg BID, Actos 30 mg QD Current orders for Inpatient glycemic control: Novolog 0-15 units TID with meals and 0-5 HS, Actos 30 units QD  HgbA1C - 6.5% Needs part of basal insulin.  Inpatient Diabetes Program Recommendations:     Add Lantus 20 units QD  Will continue to follow.  Thank you. Ailene Ards, RD, LDN, CDE Inpatient Diabetes Coordinator [phone]

## 2020-10-10 LAB — BASIC METABOLIC PANEL
Anion gap: 9 (ref 5–15)
BUN: 35 mg/dL — ABNORMAL HIGH (ref 8–23)
CO2: 26 mmol/L (ref 22–32)
Calcium: 8.7 mg/dL — ABNORMAL LOW (ref 8.9–10.3)
Chloride: 101 mmol/L (ref 98–111)
Creatinine, Ser: 1.24 mg/dL (ref 0.61–1.24)
GFR, Estimated: >60 mL/min (ref >60)
Glucose, Bld: 236 mg/dL — ABNORMAL HIGH (ref 70–99)
Potassium: 3.7 mmol/L (ref 3.5–5.1)
Sodium: 136 mmol/L (ref 135–145)

## 2020-10-10 LAB — CBC
HCT: 30.6 % — ABNORMAL LOW (ref 39.0–52.0)
Hemoglobin: 10 g/dL — ABNORMAL LOW (ref 13.0–17.0)
MCH: 29.2 pg (ref 26.0–34.0)
MCHC: 32.7 g/dL (ref 30.0–36.0)
MCV: 89.5 fL (ref 80.0–100.0)
Platelets: 153 10*3/uL (ref 150–400)
RBC: 3.42 MIL/uL — ABNORMAL LOW (ref 4.22–5.81)
RDW: 13.8 % (ref 11.5–15.5)
WBC: 11.1 10*3/uL — ABNORMAL HIGH (ref 4.0–10.5)
nRBC: 0 % (ref 0.0–0.2)

## 2020-10-10 LAB — GLUCOSE, CAPILLARY
Glucose-Capillary: 232 mg/dL — ABNORMAL HIGH (ref 70–99)
Glucose-Capillary: 315 mg/dL — ABNORMAL HIGH (ref 70–99)
Glucose-Capillary: 189 mg/dL — ABNORMAL HIGH (ref 70–99)
Glucose-Capillary: 175 mg/dL — ABNORMAL HIGH (ref 70–99)

## 2020-10-10 MED ORDER — HYDROMORPHONE HCL 2 MG PO TABS: 2.0000 mg | ORAL_TABLET | Freq: Four times a day (QID) | ORAL | 0 refills | Status: DC | PRN

## 2020-10-10 MED ORDER — METHOCARBAMOL 500 MG PO TABS: 500.0000 mg | ORAL_TABLET | Freq: Four times a day (QID) | ORAL | 0 refills | Status: DC | PRN

## 2020-10-10 NOTE — Progress Notes (Signed)
   Subjective: 2 Days Post-Op Procedure(s) (LRB): TOTAL HIP ARTHROPLASTY ANTERIOR APPROACH (Left) HARDWARE REMOVAL (Left) Patient reports pain as mild.   Patient seen in rounds by Dr. Lequita Halt. Patient is well, and has had no acute complaints or problems. No acute overnight events. Denies SOB, chest pain, or calf pain. Ambulated six feet yesterday with PT. Will continue PT today.  Plan is to go Home after hospital stay.  Objective: Vital signs in last 24 hours: Temp:  [98.8 F (37.1 C)-101 F (38.3 C)] 99.3 F (37.4 C) (03/25 0512) Pulse Rate:  [68-78] 68 (03/25 0512) Resp:  [16-20] 16 (03/25 0512) BP: (131-166)/(50-65) 154/60 (03/25 0512) SpO2:  [88 %-96 %] 96 % (03/25 0512)  Intake/Output from previous day:  Intake/Output Summary (Last 24 hours) at 10/10/2020 0708 Last data filed at 10/10/2020 0600 Gross per 24 hour  Intake 760 ml  Output 1600 ml  Net -840 ml    Intake/Output this shift: No intake/output data recorded.  Labs: Recent Labs    10/09/20 0253 10/10/20 0246  HGB 10.2* 10.0*   Recent Labs    10/09/20 0253 10/10/20 0246  WBC 9.3 11.1*  RBC 3.42* 3.42*  HCT 30.9* 30.6*  PLT 143* 153   Recent Labs    10/09/20 0253 10/10/20 0246  NA 136 136  K 4.8 3.7  CL 104 101  CO2 25 26  BUN 34* 35*  CREATININE 1.47* 1.24  GLUCOSE 215* 236*  CALCIUM 8.8* 8.7*   No results for input(s): LABPT, INR in the last 72 hours.  Exam: General - Patient is Alert and Oriented Extremity - Neurologically intact Neurovascular intact Intact pulses distally Dorsiflexion/Plantar flexion intact Dressing/Incision - clean, dry, no drainage Motor Function - intact, moving foot and toes well on exam.   Past Medical History:  Diagnosis Date  . Anxiety   . Arthritis    back, wrists elbows knees  . Chronic kidney disease    stage 3  . Complication of anesthesia    narrow airway  . Depression   . Diabetes mellitus without complication (HCC)   . Dysrhythmia    Afib   . Family history of adverse reaction to anesthesia    slow to wake up  . Hypertension   . Neuromuscular disorder (HCC)    neuropathy in feet  . Pneumonia   . Sleep apnea    CPAP    Assessment/Plan: 2 Days Post-Op Procedure(s) (LRB): TOTAL HIP ARTHROPLASTY ANTERIOR APPROACH (Left) HARDWARE REMOVAL (Left) Principal Problem:   OA (osteoarthritis) of hip Active Problems:   Primary osteoarthritis of left hip   S/P total left hip arthroplasty  Estimated body mass index is 38.35 kg/m as calculated from the following:   Height as of this encounter: 5' 7.01" (1.702 m).   Weight as of this encounter: 111.1 kg. Up with therapy  DVT Prophylaxis - Xarelto and TED hose Weight-bearing as tolerated Continue therapy.   Will plan for two sessions of PT today, and if meeting goals, will plan for discharge this afternoon.   Plan is to go home after hospital stay.   Patient to follow up in clinic with Dr. Lequita Halt in two weeks.   The PDMP database was reviewed today prior to any opioid medications being prescribed to this patient.  Nelia Shi, MBA, PA-C Orthopedic Surgery 10/10/2020, 7:08 AM

## 2020-10-10 NOTE — Plan of Care (Signed)
  Problem: Education: Goal: Knowledge of the prescribed therapeutic regimen will improve Outcome: Progressing Goal: Understanding of discharge needs will improve Outcome: Progressing Goal: Individualized Educational Video(s) Outcome: Progressing   Problem: Activity: Goal: Ability to tolerate increased activity will improve Outcome: Progressing   Problem: Clinical Measurements: Goal: Postoperative complications will be avoided or minimized Outcome: Progressing   Problem: Pain Management: Goal: Pain level will decrease with appropriate interventions Outcome: Progressing   Problem: Skin Integrity: Goal: Will show signs of wound healing Outcome: Progressing

## 2020-10-10 NOTE — Progress Notes (Signed)
Physical Therapy Treatment Patient Details Name: Jason Huang MRN: [id number] DOB: 1951-02-26 Today's Date: 10/10/2020    History of Present Illness patient is a 70 y.o. male s/p Conversion of previous hip surgery to Left Total Hip Arthroplasty on 10/08/2020 with PMH significant for neuropathy, HTN, a-fib, DM, depression, anxiety, OA, CKD, and back surgery.    PT Comments    POD # 2 pm session.  Brought the one step as pt has one step into his bedroom. Therapist demonstarted first and pt able to retain and recall from his prior.   Pt c/o MAX fatigue and mild nausea. General Gait Details: c/o MAX Fatigue "I have been awake since 3:30 this morning when they came in to take blood".  So only amb from recliner to bed this session. Assisted back to bed.   Pt has not met Therapy goals to D/C today.  Pt has to walk approx 30 feet to get from car to the house which inculdes up a RAMP.  Reported to RN.    Follow Up Recommendations  Follow surgeon's recommendation for DC plan and follow-up therapies;Home health PT     Equipment Recommendations  None recommended by PT    Recommendations for Other Services       Precautions / Restrictions Precautions Precautions: Fall Restrictions Weight Bearing Restrictions: No Other Position/Activity Restrictions: WBAT    Mobility  Bed Mobility Overal bed mobility: Needs Assistance Bed Mobility: Sit to Supine     Supine to sit: Max assist Sit to supine: Max assist   General bed mobility comments: Assisted back to bed per pt request due to fatigue and mild nausea.    Transfers Overall transfer level: Needs assistance Equipment used: Rolling walker (2 wheeled) Transfers: Sit to/from Stand Sit to Stand: Min guard         General transfer comment: from recliner with increased time, pt self able to rise this session.  Ambulation/Gait Ambulation/Gait assistance: Mod assist;+2 physical assistance;+2 safety/equipment Gait Distance (Feet): 2  Feet Assistive device: Rolling walker (2 wheeled) Gait Pattern/deviations: Step-to pattern;Decreased stride length;Decreased weight shift to left Gait velocity: decreased   General Gait Details: c/o MAX Fatigue "I have been awake since 3:30 this morning when they came in to take blood".  So only amb from recliner to bed this session.   Stairs             Wheelchair Mobility    Modified Rankin (Stroke Patients Only)       Balance                                            Cognition Arousal/Alertness: Awake/alert Behavior During Therapy: WFL for tasks assessed/performed Overall Cognitive Status: Within Functional Limits for tasks assessed                                 General Comments: AxO x 3 very pleasant originally from [facility]      Exercises      General Comments        Pertinent Vitals/Pain Pain Assessment: 0-10 Pain Score: 5  Pain Location: Lt hip Pain Descriptors / Indicators: Grimacing;Discomfort;Sore;Operative site guarding Pain Intervention(s): Premedicated before session;Repositioned;Ice applied    Home Living  Prior Function            PT Goals (current goals can now be found in the care plan section) Progress towards PT goals: Progressing toward goals    Frequency    7X/week      PT Plan Current plan remains appropriate    Co-evaluation              AM-PAC PT "6 Clicks" Mobility   Outcome Measure  Help needed turning from your back to your side while in a flat bed without using bedrails?: A Little Help needed moving from lying on your back to sitting on the side of a flat bed without using bedrails?: A Little Help needed moving to and from a bed to a chair (including a wheelchair)?: A Little Help needed standing up from a chair using your arms (e.g., wheelchair or bedside chair)?: A Little Help needed to walk in hospital room?: A Little Help needed  climbing 3-5 steps with a railing? : A Lot 6 Click Score: 17    End of Session Equipment Utilized During Treatment: Gait belt Activity Tolerance: Patient limited by fatigue;Patient limited by pain Patient left: in bed Nurse Communication: Mobility status PT Visit Diagnosis: Unsteadiness on feet (R26.81);Muscle weakness (generalized) (M62.81);Pain Pain - Right/Left: Left Pain - part of body: Hip     Time: 1140-1205 PT Time Calculation (min) (ACUTE ONLY): 25 min  Charges:  $Gait Training: 8-22 mins $Therapeutic Activity: 8-22 mins                     Rica Koyanagi  PTA Acute  Rehabilitation Services Pager      [phone] Office      [phone]

## 2020-10-10 NOTE — Progress Notes (Signed)
Physical Therapy Treatment Patient Details Name: Jason Huang MRN: [id number] DOB: [birth year] Today's Date: 10/10/2020    History of Present Illness patient is a 70 y.o. male s/p Conversion of previous hip surgery to Left Total Hip Arthroplasty on 10/08/2020 with PMH significant for neuropathy, HTN, a-fib, DM, depression, anxiety, OA, CKD, and back surgery.    PT Comments    POD # 2 am session Assisted OOB to [facility] for BM.  Extended time on BSC > 25 min.  Assisted with peri care.  Max c/o of fatigue after but did amb 20 feet with walker.  Performed a few TE's followed by ICE.    Follow Up Recommendations  Follow surgeon's recommendation for DC plan and follow-up therapies;Home health PT     Equipment Recommendations  None recommended by PT    Recommendations for Other Services       Precautions / Restrictions Precautions Precautions: Fall Restrictions Weight Bearing Restrictions: No Other Position/Activity Restrictions: WBAT    Mobility  Bed Mobility Overal bed mobility: Needs Assistance Bed Mobility: Supine to Sit     Supine to sit: Max assist     General bed mobility comments: Pt required Max Assist with supine to sit with diffuculty upper body due to ABD girth and difficulty scooting due to hip pain    Transfers Overall transfer level: Needs assistance Equipment used: Rolling walker (2 wheeled) Transfers: Sit to/from Stand Sit to Stand: Min guard         General transfer comment: from elevated bed pt was able to rise with 25% VC's on proper hand placement and safety with turns  Ambulation/Gait Ambulation/Gait assistance: Mod assist;+2 physical assistance;+2 safety/equipment Gait Distance (Feet): 20 Feet Assistive device: Rolling walker (2 wheeled) Gait Pattern/deviations: Step-to pattern;Decreased stride length;Decreased weight shift to left Gait velocity: decreased   General Gait Details: able to amb an increased distance but still limited by effort.   Recliner following behind for safety.   Stairs             Wheelchair Mobility    Modified Rankin (Stroke Patients Only)       Balance                                            Cognition Arousal/Alertness: Awake/alert Behavior During Therapy: WFL for tasks assessed/performed Overall Cognitive Status: Within Functional Limits for tasks assessed                                 General Comments: AxO x 3 very pleasant originally from [facility]      Exercises   10 reps AP, knee presses, gluteal squeezes   General Comments        Pertinent Vitals/Pain Pain Assessment: 0-10 Pain Score: 5  Pain Location: Lt hip Pain Descriptors / Indicators: Grimacing;Discomfort;Sore;Operative site guarding Pain Intervention(s): Premedicated before session;Repositioned;Ice applied    Home Living                      Prior Function            PT Goals (current goals can now be found in the care plan section) Progress towards PT goals: Progressing toward goals    Frequency    7X/week      PT Plan Current plan remains  appropriate    Co-evaluation              AM-PAC PT "6 Clicks" Mobility   Outcome Measure  Help needed turning from your back to your side while in a flat bed without using bedrails?: A Little Help needed moving from lying on your back to sitting on the side of a flat bed without using bedrails?: A Little Help needed moving to and from a bed to a chair (including a wheelchair)?: A Little Help needed standing up from a chair using your arms (e.g., wheelchair or bedside chair)?: A Little Help needed to walk in hospital room?: A Little Help needed climbing 3-5 steps with a railing? : A Lot 6 Click Score: 17    End of Session Equipment Utilized During Treatment: Gait belt Activity Tolerance: Patient limited by fatigue;Patient limited by pain Patient left: in chair;with call bell/phone within reach;with  chair alarm set Nurse Communication: Mobility status PT Visit Diagnosis: Unsteadiness on feet (R26.81);Muscle weakness (generalized) (M62.81);Pain Pain - Right/Left: Left Pain - part of body: Hip     Time: 1010-1100 PT Time Calculation (min) (ACUTE ONLY): 50 min  Charges:  $Gait Training: 8-22 mins $Therapeutic Activity: 23-37 mins                    Felecia Shelling  PTA Acute  Rehabilitation Services Pager      [phone] Office      [phone]

## 2020-10-11 LAB — CBC
HCT: 29 % — ABNORMAL LOW (ref 39.0–52.0)
Hemoglobin: 9.5 g/dL — ABNORMAL LOW (ref 13.0–17.0)
MCH: 29.5 pg (ref 26.0–34.0)
MCHC: 32.8 g/dL (ref 30.0–36.0)
MCV: 90.1 fL (ref 80.0–100.0)
Platelets: 141 10*3/uL — ABNORMAL LOW (ref 150–400)
RBC: 3.22 MIL/uL — ABNORMAL LOW (ref 4.22–5.81)
RDW: 13.9 % (ref 11.5–15.5)
WBC: 11 10*3/uL — ABNORMAL HIGH (ref 4.0–10.5)
nRBC: 0 % (ref 0.0–0.2)

## 2020-10-11 LAB — GLUCOSE, CAPILLARY
Glucose-Capillary: 131 mg/dL — ABNORMAL HIGH (ref 70–99)
Glucose-Capillary: 227 mg/dL — ABNORMAL HIGH (ref 70–99)
Glucose-Capillary: 205 mg/dL — ABNORMAL HIGH (ref 70–99)
Glucose-Capillary: 165 mg/dL — ABNORMAL HIGH (ref 70–99)

## 2020-10-11 MED ORDER — PANTOPRAZOLE SODIUM 40 MG PO TBEC
40.0000 mg | DELAYED_RELEASE_TABLET | Freq: Every day | ORAL | Status: DC
  Administered 2020-10-11 – 2020-10-13 (×3): 40 mg via ORAL
  Filled 2020-10-11 (×3): qty 1

## 2020-10-11 MED ORDER — ALUM & MAG HYDROXIDE-SIMETH 200-200-20 MG/5ML PO SUSP
30.0000 mL | ORAL | Status: DC | PRN
  Administered 2020-10-13: 30 mL via ORAL
  Filled 2020-10-11: qty 30

## 2020-10-11 NOTE — Progress Notes (Signed)
   Subjective: 3 Days Post-Op Procedure(s) (LRB): TOTAL HIP ARTHROPLASTY ANTERIOR APPROACH (Left) HARDWARE REMOVAL (Left) Patient reports pain as mild.   Patient seen in rounds by Dr. Lequita Halt. Patient is well, and has had no acute complaints or problems. No acute overnight events. Denies SOB, chest pain, or calf pain. Continues to progress slowly with PT. Plan is to go Home after hospital stay.  Objective: Vital signs in last 24 hours: Temp:  [98.7 F (37.1 C)-99 F (37.2 C)] 98.8 F (37.1 C) (03/26 0523) Pulse Rate:  [61-69] 69 (03/26 0523) Resp:  [16-17] 16 (03/26 0523) BP: (140-165)/(55-69) 165/60 (03/26 0523) SpO2:  [93 %-98 %] 93 % (03/26 0523)  Intake/Output from previous day:  Intake/Output Summary (Last 24 hours) at 10/11/2020 0743 Last data filed at 10/11/2020 0600 Gross per 24 hour  Intake 875.47 ml  Output 0 ml  Net 875.47 ml    Intake/Output this shift: No intake/output data recorded.  Labs: Recent Labs    10/09/20 0253 10/10/20 0246 10/11/20 0306  HGB 10.2* 10.0* 9.5*   Recent Labs    10/10/20 0246 10/11/20 0306  WBC 11.1* 11.0*  RBC 3.42* 3.22*  HCT 30.6* 29.0*  PLT 153 141*   Recent Labs    10/09/20 0253 10/10/20 0246  NA 136 136  K 4.8 3.7  CL 104 101  CO2 25 26  BUN 34* 35*  CREATININE 1.47* 1.24  GLUCOSE 215* 236*  CALCIUM 8.8* 8.7*   No results for input(s): LABPT, INR in the last 72 hours.  Exam: General - Patient is Alert and Oriented Extremity - Neurologically intact Neurovascular intact Intact pulses distally Dorsiflexion/Plantar flexion intact Dressing/Incision - clean, dry, no drainage Motor Function - intact, moving foot and toes well on exam.   Past Medical History:  Diagnosis Date  . Anxiety   . Arthritis    back, wrists elbows knees  . Chronic kidney disease    stage 3  . Complication of anesthesia    narrow airway  . Depression   . Diabetes mellitus without complication (HCC)   . Dysrhythmia    Afib  .  Family history of adverse reaction to anesthesia    slow to wake up  . Hypertension   . Neuromuscular disorder (HCC)    neuropathy in feet  . Pneumonia   . Sleep apnea    CPAP    Assessment/Plan: 3 Days Post-Op Procedure(s) (LRB): TOTAL HIP ARTHROPLASTY ANTERIOR APPROACH (Left) HARDWARE REMOVAL (Left) Principal Problem:   OA (osteoarthritis) of hip Active Problems:   Primary osteoarthritis of left hip   S/P total left hip arthroplasty  Estimated body mass index is 38.35 kg/m as calculated from the following:   Height as of this encounter: 5' 7.01" (1.702 m).   Weight as of this encounter: 111.1 kg. Up with therapy  DVT Prophylaxis - Xarelto and TED hose Weight-bearing as tolerated Continue therapy.   Continue PT with goal of clearing therapy to discharge home  Plan is to go home after hospital stay.   Patient to follow up in clinic with Dr. Lequita Halt in two weeks.   Darrick Grinder 10/11/2020, 7:43 AM

## 2020-10-11 NOTE — Progress Notes (Signed)
Physical Therapy Treatment Patient Details Name: Jason Huang MRN: [id number] DOB: 08-16-1950 Today's Date: 10/11/2020    History of Present Illness patient is a 70 y.o. male s/p Conversion of previous hip surgery to Left Total Hip Arthroplasty on 10/08/2020 with PMH significant for neuropathy, HTN, a-fib, DM, depression, anxiety, OA, CKD, and back surgery.    PT Comments    Progressing slowly with mobility.   Follow Up Recommendations  Follow surgeon's recommendation for DC plan and follow-up therapies     Equipment Recommendations  None recommended by PT    Recommendations for Other Services       Precautions / Restrictions Precautions Precautions: Fall Restrictions Weight Bearing Restrictions: No LLE Weight Bearing: Weight bearing as tolerated    Mobility  Bed Mobility Overal bed mobility: Needs Assistance Bed Mobility: Sit to Supine       Sit to supine: Min assist   General bed mobility comments: Increased time. Pt relied on bedrail heavily. Assist for L LE    Transfers Overall transfer level: Needs assistance Equipment used: Rolling walker (2 wheeled) Transfers: Sit to/from Stand Sit to Stand: Min guard         General transfer comment: Min guard for safety. Cues for safety, hand placement.  Ambulation/Gait Ambulation/Gait assistance: Min guard Gait Distance (Feet): 50 Feet Assistive device: Rolling walker (2 wheeled) Gait Pattern/deviations: Step-to pattern;Step-through pattern;Decreased weight shift to left;Decreased step length - right;Decreased step length - left     General Gait Details: Min guard for safety. Cues for safety, sequence. Slow gait speed. Fatigues fairly easily   Social research officer, government Rankin (Stroke Patients Only)       Balance Overall balance assessment: Needs assistance         Standing balance support: Bilateral upper extremity supported Standing balance-Leahy Scale: Poor                               Cognition Arousal/Alertness: Awake/alert Behavior During Therapy: WFL for tasks assessed/performed Overall Cognitive Status: Within Functional Limits for tasks assessed                                        Exercises      General Comments        Pertinent Vitals/Pain Pain Assessment: 0-10 Pain Score: 6  Pain Location: L hip Pain Descriptors / Indicators: Grimacing;Discomfort;Sore;Operative site guarding Pain Intervention(s): Monitored during session    Home Living                      Prior Function            PT Goals (current goals can now be found in the care plan section) Progress towards PT goals: Progressing toward goals    Frequency    7X/week      PT Plan Current plan remains appropriate    Co-evaluation              AM-PAC PT "6 Clicks" Mobility   Outcome Measure  Help needed turning from your back to your side while in a flat bed without using bedrails?: A Little Help needed moving from lying on your back to sitting on the side of a flat bed without using bedrails?: A Little Help  needed moving to and from a bed to a chair (including a wheelchair)?: A Little Help needed standing up from a chair using your arms (e.g., wheelchair or bedside chair)?: A Little Help needed to walk in hospital room?: A Little Help needed climbing 3-5 steps with a railing? : A Little 6 Click Score: 18    End of Session Equipment Utilized During Treatment: Gait belt Activity Tolerance: Patient limited by fatigue;Patient limited by pain Patient left: in bed;with call bell/phone within reach;with bed alarm set   PT Visit Diagnosis: Other abnormalities of gait and mobility (R26.89);Muscle weakness (generalized) (M62.81);Pain;Unsteadiness on feet (R26.81) Pain - Right/Left: Left Pain - part of body: Hip     Time: 1400-1417 PT Time Calculation (min) (ACUTE ONLY): 17 min  Charges:                             Faye Ramsay, PT Acute Rehabilitation  Office: [phone] Pager: [phone]

## 2020-10-11 NOTE — Progress Notes (Signed)
Physical Therapy Treatment Patient Details Name: Jason Huang MRN: [id number] DOB: [birth year] Today's Date: 10/11/2020    History of Present Illness patient is a 70 y.o. male s/p Conversion of previous hip surgery to Left Total Hip Arthroplasty on 10/08/2020 with PMH significant for neuropathy, HTN, a-fib, DM, depression, anxiety, OA, CKD, and back surgery.    PT Comments    Progressing slowly with mobility. Pt walked ~45 feet this morning. Still limited by weakness and fatigue. Dyspnea 2/4 with activity. Pt expresses concerns about readiness for d/c home. He feels he needs another night or two to continue therapy in house prior to d/c home. Encouraged him to discuss this with MD as well. Will continue to follow and progress activity as tolerated.     Follow Up Recommendations  Follow surgeon's recommendation for DC plan and follow-up therapies;Home health PT     Equipment Recommendations  None recommended by PT    Recommendations for Other Services       Precautions / Restrictions Precautions Precautions: Fall Restrictions Weight Bearing Restrictions: No LLE Weight Bearing: Weight bearing as tolerated    Mobility  Bed Mobility Overal bed mobility: Needs Assistance Bed Mobility: Supine to Sit     Supine to sit: Min assist;HOB elevated     General bed mobility comments: Increased time. Pt relied on bedrail heavily. Assist for L LE and trunk.    Transfers Overall transfer level: Needs assistance Equipment used: Rolling walker (2 wheeled) Transfers: Sit to/from Stand Sit to Stand: From elevated surface;Min guard         General transfer comment: Min guard for safety. Cues for safety, hand placement.  Ambulation/Gait Ambulation/Gait assistance: Min assist Gait Distance (Feet): 45 Feet Assistive device: Rolling walker (2 wheeled) Gait Pattern/deviations: Step-to pattern;Step-through pattern;Decreased weight shift to left;Decreased step length - right;Decreased  step length - left     General Gait Details: Assist to stabilize throughout distance. Cues for safety, sequence. Slow gait speed. Followed with recliner and used it to transport pt back to room 2* fatigue.   Stairs             Wheelchair Mobility    Modified Rankin (Stroke Patients Only)       Balance Overall balance assessment: Needs assistance         Standing balance support: Bilateral upper extremity supported Standing balance-Leahy Scale: Poor                              Cognition Arousal/Alertness: Awake/alert Behavior During Therapy: WFL for tasks assessed/performed Overall Cognitive Status: Within Functional Limits for tasks assessed                                        Exercises      General Comments        Pertinent Vitals/Pain Pain Assessment: 0-10 Pain Score: 6  Pain Location: L hip Pain Descriptors / Indicators: Grimacing;Discomfort;Sore;Operative site guarding Pain Intervention(s): Limited activity within patient's tolerance;Monitored during session;Repositioned;Ice applied    Home Living                      Prior Function            PT Goals (current goals can now be found in the care plan section) Progress towards PT goals: Progressing toward goals  Frequency    7X/week      PT Plan Current plan remains appropriate    Co-evaluation              AM-PAC PT "6 Clicks" Mobility   Outcome Measure  Help needed turning from your back to your side while in a flat bed without using bedrails?: A Little Help needed moving from lying on your back to sitting on the side of a flat bed without using bedrails?: A Little Help needed moving to and from a bed to a chair (including a wheelchair)?: A Little Help needed standing up from a chair using your arms (e.g., wheelchair or bedside chair)?: A Little Help needed to walk in hospital room?: A Little Help needed climbing 3-5 steps with a  railing? : A Lot 6 Click Score: 17    End of Session Equipment Utilized During Treatment: Gait belt Activity Tolerance: Patient limited by fatigue;Patient limited by pain Patient left: in chair;with call bell/phone within reach   PT Visit Diagnosis: Other abnormalities of gait and mobility (R26.89);Muscle weakness (generalized) (M62.81);Pain;Unsteadiness on feet (R26.81) Pain - Right/Left: Left Pain - part of body: Hip     Time: 4097-3532 PT Time Calculation (min) (ACUTE ONLY): 17 min  Charges:  $Gait Training: 8-22 mins                         Faye Ramsay, PT Acute Rehabilitation  Office: [phone] Pager: [phone]

## 2020-10-12 LAB — GLUCOSE, CAPILLARY
Glucose-Capillary: 118 mg/dL — ABNORMAL HIGH (ref 70–99)
Glucose-Capillary: 263 mg/dL — ABNORMAL HIGH (ref 70–99)
Glucose-Capillary: 209 mg/dL — ABNORMAL HIGH (ref 70–99)
Glucose-Capillary: 195 mg/dL — ABNORMAL HIGH (ref 70–99)

## 2020-10-12 NOTE — Plan of Care (Signed)
  Problem: Education: Goal: Knowledge of the prescribed therapeutic regimen will improve Outcome: Progressing Goal: Understanding of discharge needs will improve Outcome: Progressing   Problem: Activity: Goal: Ability to avoid complications of mobility impairment will improve Outcome: Progressing Goal: Ability to tolerate increased activity will improve Outcome: Progressing   Problem: Pain Management: Goal: Pain level will decrease with appropriate interventions Outcome: Progressing   

## 2020-10-12 NOTE — Progress Notes (Signed)
   Subjective: 4 Days Post-Op Procedure(s) (LRB): TOTAL HIP ARTHROPLASTY ANTERIOR APPROACH (Left) HARDWARE REMOVAL (Left)  Pt c/o mild pain to left hip but overall improving Therapy going well Plan for d/c tomorrow to aid with issues at home Patient reports pain as mild.  Objective:   VITALS:   Vitals:   10/11/20 2132 10/12/20 0516  BP: (!) 147/49 (!) 160/57  Pulse: 75 67  Resp: 17 16  Temp: (!) 97.1 F (36.2 C) 99.7 F (37.6 C)  SpO2: 90% 94%    Left hip incision healing well nv intact distally No rashes or edema distally Good early rom with some guarding  LABS Recent Labs    10/10/20 0246 10/11/20 0306  HGB 10.0* 9.5*  HCT 30.6* 29.0*  WBC 11.1* 11.0*  PLT 153 141*    Recent Labs    10/10/20 0246  NA 136  K 3.7  BUN 35*  CREATININE 1.24  GLUCOSE 236*     Assessment/Plan: 4 Days Post-Op Procedure(s) (LRB): TOTAL HIP ARTHROPLASTY ANTERIOR APPROACH (Left) HARDWARE REMOVAL (Left) Continue PT/OT Plan for d/c tomorrow after therapy Pain management Pulmonary toilet   Brad Antonieta Iba, MPAS [facility] is now [facility]  [place] [address], [place], [place] [address] Phone: [phone] [email or web address] Facebook  [employer]

## 2020-10-12 NOTE — Plan of Care (Signed)
  Problem: Education: Goal: Knowledge of the prescribed therapeutic regimen will improve Outcome: Progressing Goal: Understanding of discharge needs will improve Outcome: Progressing   Problem: Pain Management: Goal: Pain level will decrease with appropriate interventions Outcome: Progressing   

## 2020-10-12 NOTE — Progress Notes (Addendum)
Physical Therapy Treatment Patient Details Name: Jason Huang MRN: [id number] DOB: 1950/08/27 Today's Date: 10/12/2020    History of Present Illness patient is a 70 y.o. male s/p Conversion of previous hip surgery to Left Total Hip Arthroplasty on 10/08/2020 with PMH significant for neuropathy, HTN, a-fib, DM, depression, anxiety, OA, CKD, and back surgery.    PT Comments    Progressing with mobility. Plan is for d/c home on tomorrow.    Follow Up Recommendations  Follow surgeon's recommendation for DC plan and follow-up therapies     Equipment Recommendations  None recommended by PT    Recommendations for Other Services       Precautions / Restrictions Precautions Precautions: Fall Restrictions Weight Bearing Restrictions: No LLE Weight Bearing: Weight bearing as tolerated    Mobility  Bed Mobility Overal bed mobility: Needs Assistance Bed Mobility: Supine to Sit;Sit to Supine     Supine to sit: Min assist Sit to supine: Min assist   General bed mobility comments: assist for L LE. increased time.    Transfers Overall transfer level: Needs assistance Equipment used: Rolling walker (2 wheeled) Transfers: Sit to/from Stand Sit to Stand: Min guard         General transfer comment: Min guard for safety. Cues for safety, hand placement.  Ambulation/Gait Ambulation/Gait assistance: Min guard Gait Distance (Feet): 55 Feet Assistive device: Rolling walker (2 wheeled) Gait Pattern/deviations: Step-to pattern;Step-through pattern;Decreased weight shift to left;Decreased step length - right;Decreased step length - left     General Gait Details: Min guard for safety. Cues for safety, sequence. Slow gait speed. Dyspnea 2/4. O2 98% on RA   Stairs             Wheelchair Mobility    Modified Rankin (Stroke Patients Only)       Balance Overall balance assessment: Needs assistance         Standing balance support: Bilateral upper extremity  supported Standing balance-Leahy Scale: Poor                              Cognition Arousal/Alertness: Awake/alert Behavior During Therapy: WFL for tasks assessed/performed Overall Cognitive Status: Within Functional Limits for tasks assessed                                        Exercises  Ankle Pumps, 10 reps, Supine Quad Sets, 10 reps, Supine Heel Slides, 10 reps, Supine Hip Abd, 10 reps, Supine    General Comments        Pertinent Vitals/Pain Pain Assessment: 0-10 Pain Score: 6  Pain Location: L hip/thigh Pain Descriptors / Indicators: Discomfort;Sore Pain Intervention(s): Limited activity within patient's tolerance;Monitored during session    Home Living                      Prior Function            PT Goals (current goals can now be found in the care plan section) Progress towards PT goals: Progressing toward goals    Frequency    7X/week      PT Plan Current plan remains appropriate    Co-evaluation              AM-PAC PT "6 Clicks" Mobility   Outcome Measure  Help needed turning from your back to your side while in  a flat bed without using bedrails?: A Little Help needed moving from lying on your back to sitting on the side of a flat bed without using bedrails?: A Little Help needed moving to and from a bed to a chair (including a wheelchair)?: A Little Help needed standing up from a chair using your arms (e.g., wheelchair or bedside chair)?: A Little Help needed to walk in hospital room?: A Little Help needed climbing 3-5 steps with a railing? : A Little 6 Click Score: 18    End of Session Equipment Utilized During Treatment: Gait belt Activity Tolerance: Patient limited by fatigue Patient left: in bed;with call bell/phone within reach   PT Visit Diagnosis: Other abnormalities of gait and mobility (R26.89);Muscle weakness (generalized) (M62.81);Pain;Unsteadiness on feet (R26.81) Pain - Right/Left:  Left Pain - part of body: Hip     Time: 7829-5621 PT Time Calculation (min) (ACUTE ONLY): 24 min  Charges:  $Gait Training: 23-37 mins                         Faye Ramsay, PT Acute Rehabilitation  Office: [phone] Pager: [phone]

## 2020-10-12 NOTE — Progress Notes (Signed)
Physical Therapy Treatment Patient Details Name: Jason Huang MRN: [id number] DOB: 1951-04-07 Today's Date: 10/12/2020    History of Present Illness patient is a 70 y.o. male s/p Conversion of previous hip surgery to Left Total Hip Arthroplasty on 10/08/2020 with PMH significant for neuropathy, HTN, a-fib, DM, depression, anxiety, OA, CKD, and back surgery.    PT Comments    Pt c/o fatigue and increased pain this session. Will continue to progress activity as tolerated    Follow Up Recommendations  Follow surgeon's recommendation for DC plan and follow-up therapies     Equipment Recommendations  None recommended by PT    Recommendations for Other Services       Precautions / Restrictions Precautions Precautions: Fall Restrictions Weight Bearing Restrictions: No LLE Weight Bearing: Weight bearing as tolerated    Mobility  Bed Mobility               General bed mobility comments: sitting EOB    Transfers Overall transfer level: Needs assistance Equipment used: Rolling walker (2 wheeled) Transfers: Sit to/from Stand Sit to Stand: Min guard            Ambulation/Gait Ambulation/Gait assistance: Min guard Gait Distance (Feet): 40 Feet Assistive device: Rolling walker (2 wheeled) Gait Pattern/deviations: Step-to pattern;Step-through pattern;Decreased weight shift to left;Decreased step length - right;Decreased step length - left     General Gait Details: Min guard for safety. Cues for safety, sequence. Slow gait speed. Fatigued quicky on today. Pt also c/o increased pain   Stairs             Wheelchair Mobility    Modified Rankin (Stroke Patients Only)       Balance Overall balance assessment: Needs assistance         Standing balance support: Bilateral upper extremity supported Standing balance-Leahy Scale: Poor                              Cognition Arousal/Alertness: Awake/alert Behavior During Therapy: WFL for tasks  assessed/performed Overall Cognitive Status: Within Functional Limits for tasks assessed                                        Exercises      General Comments        Pertinent Vitals/Pain Pain Assessment: 0-10 Pain Score: 8  Pain Location: L hip./thigh Pain Descriptors / Indicators: Grimacing;Discomfort;Sore;Operative site guarding Pain Intervention(s): Limited activity within patient's tolerance;Monitored during session    Home Living                      Prior Function            PT Goals (current goals can now be found in the care plan section) Progress towards PT goals: Progressing toward goals    Frequency    7X/week      PT Plan Current plan remains appropriate    Co-evaluation              AM-PAC PT "6 Clicks" Mobility   Outcome Measure  Help needed turning from your back to your side while in a flat bed without using bedrails?: A Little Help needed moving from lying on your back to sitting on the side of a flat bed without using bedrails?: A Little Help needed moving to and from a  bed to a chair (including a wheelchair)?: A Little Help needed standing up from a chair using your arms (e.g., wheelchair or bedside chair)?: A Little Help needed to walk in hospital room?: A Little Help needed climbing 3-5 steps with a railing? : A Little 6 Click Score: 18    End of Session Equipment Utilized During Treatment: Gait belt Activity Tolerance: Patient limited by fatigue;Patient limited by pain Patient left: in chair;with call bell/phone within reach   PT Visit Diagnosis: Other abnormalities of gait and mobility (R26.89);Muscle weakness (generalized) (M62.81);Pain;Unsteadiness on feet (R26.81) Pain - Right/Left: Left Pain - part of body: Hip     Time: 1020-1035 PT Time Calculation (min) (ACUTE ONLY): 15 min  Charges:  $Gait Training: 8-22 mins              Faye Ramsay, PT Acute Rehabilitation  Office:  [phone] Pager: [phone]

## 2020-10-12 NOTE — Plan of Care (Signed)
  Problem: Education: Goal: Knowledge of the prescribed therapeutic regimen will improve 10/12/2020 0917 by Beverly Sessions, RN Outcome: Progressing 10/12/2020 0743 by Beverly Sessions, RN Outcome: Progressing   Problem: Activity: Goal: Ability to avoid complications of mobility impairment will improve 10/12/2020 0917 by Beverly Sessions, RN Outcome: Progressing 10/12/2020 0743 by Beverly Sessions, RN Outcome: Progressing   Problem: Pain Management: Goal: Pain level will decrease with appropriate interventions 10/12/2020 0917 by Beverly Sessions, RN Outcome: Progressing 10/12/2020 0743 by Beverly Sessions, RN Outcome: Progressing

## 2020-10-12 NOTE — Plan of Care (Signed)
  Problem: Education: Goal: Knowledge of the prescribed therapeutic regimen will improve Outcome: Progressing   Problem: Activity: Goal: Ability to avoid complications of mobility impairment will improve Outcome: Progressing   Problem: Pain Management: Goal: Pain level will decrease with appropriate interventions Outcome: Progressing   

## 2020-10-13 LAB — GLUCOSE, CAPILLARY
Glucose-Capillary: 144 mg/dL — ABNORMAL HIGH (ref 70–99)
Glucose-Capillary: 154 mg/dL — ABNORMAL HIGH (ref 70–99)

## 2020-10-13 NOTE — Progress Notes (Signed)
Physical Therapy Treatment Patient Details Name: Jason Huang MRN: [id number] DOB: 08/27/1950 Today's Date: 10/13/2020    History of Present Illness patient is a 70 y.o. male s/p Conversion of previous hip surgery to Left Total Hip Arthroplasty on 10/08/2020 with PMH significant for neuropathy, HTN, a-fib, DM, depression, anxiety, OA, CKD, and back surgery.    PT Comments    Pt continues to progress with mobility. All education completed. Okay to d/c from PT standpoint.    Follow Up Recommendations  Follow surgeon's recommendation for DC plan and follow-up therapies     Equipment Recommendations  None recommended by PT    Recommendations for Other Services       Precautions / Restrictions      Mobility  Bed Mobility Overal bed mobility: Needs Assistance Bed Mobility: Supine to Sit     Supine to sit: Min assist;HOB elevated     General bed mobility comments: assist for L LE. increased time.    Transfers Overall transfer level: Needs assistance Equipment used: Rolling walker (2 wheeled) Transfers: Sit to/from Stand Sit to Stand: Min guard         General transfer comment: Min guard for safety. Cues for safety, hand placement.  Ambulation/Gait Ambulation/Gait assistance: Min guard Gait Distance (Feet): 75 Feet Assistive device: Rolling walker (2 wheeled) Gait Pattern/deviations: Step-through pattern;Decreased weight shift to left;Decreased step length - right;Decreased step length - left;Decreased stride length     General Gait Details: Min guard for safety.  Slow gait speed.   Stairs             Wheelchair Mobility    Modified Rankin (Stroke Patients Only)       Balance Overall balance assessment: Needs assistance         Standing balance support: Bilateral upper extremity supported Standing balance-Leahy Scale: Fair                              Cognition Arousal/Alertness: Awake/alert Behavior During Therapy: WFL for  tasks assessed/performed Overall Cognitive Status: Within Functional Limits for tasks assessed                                        Exercises Total Joint Exercises Ankle Circles/Pumps: AROM;Both;10 reps Quad Sets: AROM;Both;10 reps Heel Slides: AAROM;Left;10 reps Hip ABduction/ADduction: AAROM;Left;10 reps    General Comments        Pertinent Vitals/Pain Pain Assessment: 0-10 Pain Score: 5  Pain Location: L hip/thigh Pain Descriptors / Indicators: Discomfort;Sore Pain Intervention(s): Monitored during session;Ice applied    Home Living                      Prior Function            PT Goals (current goals can now be found in the care plan section) Progress towards PT goals: Progressing toward goals    Frequency    7X/week      PT Plan Current plan remains appropriate    Co-evaluation              AM-PAC PT "6 Clicks" Mobility   Outcome Measure  Help needed turning from your back to your side while in a flat bed without using bedrails?: A Little Help needed moving from lying on your back to sitting on the side of a flat bed  without using bedrails?: A Little Help needed moving to and from a bed to a chair (including a wheelchair)?: A Little Help needed standing up from a chair using your arms (e.g., wheelchair or bedside chair)?: A Little Help needed to walk in hospital room?: A Little Help needed climbing 3-5 steps with a railing? : A Little 6 Click Score: 18    End of Session Equipment Utilized During Treatment: Gait belt Activity Tolerance: Patient tolerated treatment well Patient left: in chair;with call bell/phone within reach   PT Visit Diagnosis: Other abnormalities of gait and mobility (R26.89);Muscle weakness (generalized) (M62.81);Pain;Unsteadiness on feet (R26.81) Pain - Right/Left: Left Pain - part of body: Hip     Time: 6578-4696 PT Time Calculation (min) (ACUTE ONLY): 32 min  Charges:  $Gait Training:  8-22 mins $Therapeutic Exercise: 8-22 mins                         Faye Ramsay, PT Acute Rehabilitation  Office: [phone] Pager: [phone]

## 2020-10-13 NOTE — Progress Notes (Signed)
   Subjective: 5 Days Post-Op Procedure(s) (LRB): TOTAL HIP ARTHROPLASTY ANTERIOR APPROACH (Left) HARDWARE REMOVAL (Left) Patient reports pain as mild.   Patient seen in rounds by Dr. Lequita Halt. Patient is well, and has had no acute complaints or problems. Denies chest pain or SOB. No issues over the weekend. States he is ready to go home.  Objective: Vital signs in last 24 hours: Temp:  [97 F (36.1 C)-99.5 F (37.5 C)] 97 F (36.1 C) (03/28 0454) Pulse Rate:  [63-76] 63 (03/28 0454) Resp:  [15-18] 16 (03/28 0454) BP: (146-175)/(56-75) 175/70 (03/28 0454) SpO2:  [93 %-97 %] 95 % (03/28 0454)  Intake/Output from previous day:  Intake/Output Summary (Last 24 hours) at 10/13/2020 0842 Last data filed at 10/13/2020 0800 Gross per 24 hour  Intake 960 ml  Output 1275 ml  Net -315 ml    Intake/Output this shift: Total I/O In: -  Out: 400 [Urine:400]  Labs: Recent Labs    10/11/20 0306  HGB 9.5*   Recent Labs    10/11/20 0306  WBC 11.0*  RBC 3.22*  HCT 29.0*  PLT 141*   No results for input(s): NA, K, CL, CO2, BUN, CREATININE, GLUCOSE, CALCIUM in the last 72 hours. No results for input(s): LABPT, INR in the last 72 hours.  Exam: General - Patient is Alert and Oriented Extremity - Neurologically intact Neurovascular intact Sensation intact distally Dorsiflexion/Plantar flexion intact Dressing/Incision - clean, dry, no drainage Motor Function - intact, moving foot and toes well on exam.   Past Medical History:  Diagnosis Date  . Anxiety   . Arthritis    back, wrists elbows knees  . Chronic kidney disease    stage 3  . Complication of anesthesia    narrow airway  . Depression   . Diabetes mellitus without complication (HCC)   . Dysrhythmia    Afib  . Family history of adverse reaction to anesthesia    slow to wake up  . Hypertension   . Neuromuscular disorder (HCC)    neuropathy in feet  . Pneumonia   . Sleep apnea    CPAP    Assessment/Plan: 5  Days Post-Op Procedure(s) (LRB): TOTAL HIP ARTHROPLASTY ANTERIOR APPROACH (Left) HARDWARE REMOVAL (Left) Principal Problem:   OA (osteoarthritis) of hip Active Problems:   Primary osteoarthritis of left hip   S/P total left hip arthroplasty  Estimated body mass index is 38.35 kg/m as calculated from the following:   Height as of this encounter: 5' 7.01" (1.702 m).   Weight as of this encounter: 111.1 kg. Up with therapy  DVT Prophylaxis - Xarelto Weight-bearing as tolerated  Plan for discharge with HEP after one session of PT this AM. Follow-up in the office 2 weeks from surgery.  Arther Abbott, PA-C Orthopedic Surgery [phone] 10/13/2020, 8:42 AM

## 2020-10-13 NOTE — Progress Notes (Signed)
Discussed discharge instructions with patient and wife. Verbalize understanding.

## 2020-10-13 NOTE — Care Management Important Message (Signed)
Important Message  Patient Details IM Letter given to the Patient. Name: Jason Huang MRN: [id number] Date of Birth: 1951/05/21   Medicare Important Message Given:  Yes     Caren Macadam 10/13/2020, 12:56 PM

## 2020-10-15 NOTE — Discharge Summary (Signed)
Physician Discharge Summary   Patient ID: Jason NielsenRobert Corson MRN: [id number] DOB/AGE: [birth year] 70 y.o.  Admit date: 10/08/2020 Discharge date: 10/13/2020  Primary Diagnosis:  Osteoarthritis, left hip   Admission Diagnoses:  Past Medical History:  Diagnosis Date  . Anxiety   . Arthritis    back, wrists elbows knees  . Chronic kidney disease    stage 3  . Complication of anesthesia    narrow airway  . Depression   . Diabetes mellitus without complication (HCC)   . Dysrhythmia    Afib  . Family history of adverse reaction to anesthesia    slow to wake up  . Hypertension   . Neuromuscular disorder (HCC)    neuropathy in feet  . Pneumonia   . Sleep apnea    CPAP   Discharge Diagnoses:   Principal Problem:   OA (osteoarthritis) of hip Active Problems:   Primary osteoarthritis of left hip   S/P total left hip arthroplasty  Estimated body mass index is 38.35 kg/m as calculated from the following:   Height as of this encounter: 5' 7.01" (1.702 m).   Weight as of this encounter: 111.1 kg.  Procedure:  Procedure(s) (LRB): TOTAL HIP ARTHROPLASTY ANTERIOR APPROACH (Left) HARDWARE REMOVAL (Left)   Consults: None  HPI: Jason NielsenRobert Massengale is a 70 y.o. male who has advanced end-stage arthritis of their Left  hip post-pinning for SCFE in his adolescent years with progressively worsening pain and dysfunction.The patient has failed nonoperative management and presents for total hip arthroplasty.   Laboratory Data: Admission on 10/08/2020, Discharged on 10/13/2020  Component Date Value Ref Range Status  . Glucose-Capillary 10/08/2020 143* 70 - 99 mg/dL Final   Glucose reference range applies only to samples taken after fasting for at least 8 hours.  . Comment 1 10/08/2020 Notify RN   Final  . Comment 2 10/08/2020 Document in Chart   Final  . Glucose-Capillary 10/08/2020 250* 70 - 99 mg/dL Final   Glucose reference range applies only to samples taken after fasting for at least 8  hours.  . WBC 10/09/2020 9.3  4.0 - 10.5 K/uL Final  . RBC 10/09/2020 3.42* 4.22 - 5.81 MIL/uL Final  . Hemoglobin 10/09/2020 10.2* 13.0 - 17.0 g/dL Final  . HCT 40/98/119103/24/2022 30.9* 39.0 - 52.0 % Final  . MCV 10/09/2020 90.4  80.0 - 100.0 fL Final  . MCH 10/09/2020 29.8  26.0 - 34.0 pg Final  . MCHC 10/09/2020 33.0  30.0 - 36.0 g/dL Final  . RDW 47/82/956203/24/2022 13.7  11.5 - 15.5 % Final  . Platelets 10/09/2020 143* 150 - 400 K/uL Final  . nRBC 10/09/2020 0.0  0.0 - 0.2 % Final   Performed at [facility], [address] [place], [place] [id number]  . Sodium 10/09/2020 136  135 - 145 mmol/L Final  . Potassium 10/09/2020 4.8  3.5 - 5.1 mmol/L Final  . Chloride 10/09/2020 104  98 - 111 mmol/L Final  . CO2 10/09/2020 25  22 - 32 mmol/L Final  . Glucose, Bld 10/09/2020 215* 70 - 99 mg/dL Final   Glucose reference range applies only to samples taken after fasting for at least 8 hours.  . BUN 10/09/2020 34* 8 - 23 mg/dL Final  . Creatinine, Ser 10/09/2020 1.47* 0.61 - 1.24 mg/dL Final  . Calcium 57/84/696203/24/2022 8.8* 8.9 - 10.3 mg/dL Final  . GFR, Estimated 10/09/2020 51* >60 mL/min Final   Comment: (NOTE) Calculated using the CKD-EPI Creatinine Equation (2021)   .  Anion gap 10/09/2020 7  5 - 15 Final   Performed at [facility], [address], [place], [place] [address]  . Glucose-Capillary 10/08/2020 339* 70 - 99 mg/dL Final   Glucose reference range applies only to samples taken after fasting for at least 8 hours.  . Glucose-Capillary 10/09/2020 204* 70 - 99 mg/dL Final   Glucose reference range applies only to samples taken after fasting for at least 8 hours.  . Glucose-Capillary 10/09/2020 199* 70 - 99 mg/dL Final   Glucose reference range applies only to samples taken after fasting for at least 8 hours.  . Glucose-Capillary 10/09/2020 271* 70 - 99 mg/dL Final   Glucose reference range applies only to samples taken after fasting for at least 8 hours.  . WBC  10/10/2020 11.1* 4.0 - 10.5 K/uL Final  . RBC 10/10/2020 3.42* 4.22 - 5.81 MIL/uL Final  . Hemoglobin 10/10/2020 10.0* 13.0 - 17.0 g/dL Final  . HCT 57/84/6962 30.6* 39.0 - 52.0 % Final  . MCV 10/10/2020 89.5  80.0 - 100.0 fL Final  . MCH 10/10/2020 29.2  26.0 - 34.0 pg Final  . MCHC 10/10/2020 32.7  30.0 - 36.0 g/dL Final  . RDW 95/28/4132 13.8  11.5 - 15.5 % Final  . Platelets 10/10/2020 153  150 - 400 K/uL Final   CONSISTENT WITH PREVIOUS RESULT  . nRBC 10/10/2020 0.0  0.0 - 0.2 % Final   Performed at [facility], [address], [place], [place] [address]  . Sodium 10/10/2020 136  135 - 145 mmol/L Final  . Potassium 10/10/2020 3.7  3.5 - 5.1 mmol/L Final   DELTA CHECK NOTED  . Chloride 10/10/2020 101  98 - 111 mmol/L Final  . CO2 10/10/2020 26  22 - 32 mmol/L Final  . Glucose, Bld 10/10/2020 236* 70 - 99 mg/dL Final   Glucose reference range applies only to samples taken after fasting for at least 8 hours.  . BUN 10/10/2020 35* 8 - 23 mg/dL Final  . Creatinine, Ser 10/10/2020 1.24  0.61 - 1.24 mg/dL Final  . Calcium 27/25/3664 8.7* 8.9 - 10.3 mg/dL Final  . GFR, Estimated 10/10/2020 >60  >60 mL/min Final   Comment: (NOTE) Calculated using the CKD-EPI Creatinine Equation (2021)   . Anion gap 10/10/2020 9  5 - 15 Final   Performed at [facility], [address], [place], [place] [address]  . Glucose-Capillary 10/09/2020 337* 70 - 99 mg/dL Final   Glucose reference range applies only to samples taken after fasting for at least 8 hours.  . Glucose-Capillary 10/10/2020 232* 70 - 99 mg/dL Final   Glucose reference range applies only to samples taken after fasting for at least 8 hours.  . Glucose-Capillary 10/10/2020 315* 70 - 99 mg/dL Final   Glucose reference range applies only to samples taken after fasting for at least 8 hours.  . WBC 10/11/2020 11.0* 4.0 - 10.5 K/uL Final  . RBC 10/11/2020 3.22* 4.22 - 5.81 MIL/uL Final  . Hemoglobin  10/11/2020 9.5* 13.0 - 17.0 g/dL Final  . HCT 42/59/5638 29.0* 39.0 - 52.0 % Final  . MCV 10/11/2020 90.1  80.0 - 100.0 fL Final  . MCH 10/11/2020 29.5  26.0 - 34.0 pg Final  . MCHC 10/11/2020 32.8  30.0 - 36.0 g/dL Final  . RDW 75/64/3329 13.9  11.5 - 15.5 % Final  . Platelets 10/11/2020 141* 150 - 400 K/uL Final  . nRBC 10/11/2020 0.0  0.0 -  0.2 % Final   Performed at [facility], [address] [place], [place] [address]  . Glucose-Capillary 10/10/2020 189* 70 - 99 mg/dL Final   Glucose reference range applies only to samples taken after fasting for at least 8 hours.  . Glucose-Capillary 10/10/2020 175* 70 - 99 mg/dL Final   Glucose reference range applies only to samples taken after fasting for at least 8 hours.  . Glucose-Capillary 10/11/2020 131* 70 - 99 mg/dL Final   Glucose reference range applies only to samples taken after fasting for at least 8 hours.  . Glucose-Capillary 10/11/2020 227* 70 - 99 mg/dL Final   Glucose reference range applies only to samples taken after fasting for at least 8 hours.  . Glucose-Capillary 10/11/2020 205* 70 - 99 mg/dL Final   Glucose reference range applies only to samples taken after fasting for at least 8 hours.  . Glucose-Capillary 10/11/2020 165* 70 - 99 mg/dL Final   Glucose reference range applies only to samples taken after fasting for at least 8 hours.  . Glucose-Capillary 10/12/2020 118* 70 - 99 mg/dL Final   Glucose reference range applies only to samples taken after fasting for at least 8 hours.  . Glucose-Capillary 10/12/2020 263* 70 - 99 mg/dL Final   Glucose reference range applies only to samples taken after fasting for at least 8 hours.  . Glucose-Capillary 10/12/2020 209* 70 - 99 mg/dL Final   Glucose reference range applies only to samples taken after fasting for at least 8 hours.  . Glucose-Capillary 10/12/2020 195* 70 - 99 mg/dL Final   Glucose reference range applies only to samples taken after fasting for  at least 8 hours.  . Glucose-Capillary 10/13/2020 144* 70 - 99 mg/dL Final   Glucose reference range applies only to samples taken after fasting for at least 8 hours.  . Glucose-Capillary 10/13/2020 154* 70 - 99 mg/dL Final   Glucose reference range applies only to samples taken after fasting for at least 8 hours.  Hospital Outpatient Visit on 10/04/2020  Component Date Value Ref Range Status  . SARS Coronavirus 2 10/04/2020 NEGATIVE  NEGATIVE Final   Comment: (NOTE) SARS-CoV-2 target nucleic acids are NOT DETECTED.  The SARS-CoV-2 RNA is generally detectable in upper and lower respiratory specimens during the acute phase of infection. Negative results do not preclude SARS-CoV-2 infection, do not rule out co-infections with other pathogens, and should not be used as the sole basis for treatment or other patient management decisions. Negative results must be combined with clinical observations, patient history, and epidemiological information. The expected result is Negative.  Fact Sheet for Patients: HairSlick.no  Fact Sheet for Healthcare Providers: quierodirigir.com  This test is not yet approved or cleared by the [place] FDA and  has been authorized for detection and/or diagnosis of SARS-CoV-2 by FDA under an Emergency Use Authorization (EUA). This EUA will remain  in effect (meaning this test can be used) for the duration of the COVID-19 declaration under Se                          ction 564(b)(1) of the Act, 21 U.S.C. section 360bbb-3(b)(1), unless the authorization is terminated or revoked sooner.  Performed at [facility], [address], [place], [place] [address]   Hospital Outpatient Visit on 10/01/2020  Component Date Value Ref Range Status  . MRSA, PCR 10/01/2020 NEGATIVE  NEGATIVE Final  . Staphylococcus aureus 10/01/2020 NEGATIVE  NEGATIVE  Final   Comment: (NOTE) The Xpert SA Assay (FDA  approved for NASAL specimens in patients 89 years of age and older), is one component of a comprehensive surveillance program. It is not intended to diagnose infection nor to guide or monitor treatment. Performed at [facility], [address], [place], [place] [address]   . Hgb A1c MFr Bld 10/01/2020 6.5* 4.8 - 5.6 % Final   Comment: (NOTE) Pre diabetes:          5.7%-6.4%  Diabetes:              >6.4%  Glycemic control for   <7.0% adults with diabetes   . Mean Plasma Glucose 10/01/2020 139.85  mg/dL Final   Performed at [facility], [address], [place], [place] [address]  . Glucose-Capillary 10/01/2020 166* 70 - 99 mg/dL Final   Glucose reference range applies only to samples taken after fasting for at least 8 hours.  . WBC 10/01/2020 5.3  4.0 - 10.5 K/uL Final  . RBC 10/01/2020 4.46  4.22 - 5.81 MIL/uL Final  . Hemoglobin 10/01/2020 13.0  13.0 - 17.0 g/dL Final  . HCT 93/23/5573 40.7  39.0 - 52.0 % Final  . MCV 10/01/2020 91.3  80.0 - 100.0 fL Final  . MCH 10/01/2020 29.1  26.0 - 34.0 pg Final  . MCHC 10/01/2020 31.9  30.0 - 36.0 g/dL Final  . RDW 22/08/5425 13.5  11.5 - 15.5 % Final  . Platelets 10/01/2020 141* 150 - 400 K/uL Final  . nRBC 10/01/2020 0.0  0.0 - 0.2 % Final   Performed at [facility], [address], [place], [place] [address]  . Sodium 10/01/2020 138  135 - 145 mmol/L Final  . Potassium 10/01/2020 4.7  3.5 - 5.1 mmol/L Final  . Chloride 10/01/2020 102  98 - 111 mmol/L Final  . CO2 10/01/2020 27  22 - 32 mmol/L Final  . Glucose, Bld 10/01/2020 171* 70 - 99 mg/dL Final   Glucose reference range applies only to samples taken after fasting for at least 8 hours.  . BUN 10/01/2020 33* 8 - 23 mg/dL Final  . Creatinine, Ser 10/01/2020 1.58* 0.61 - 1.24 mg/dL Final  . Calcium 62/83/1517 9.2  8.9 - 10.3 mg/dL Final  . Total Protein 10/01/2020 7.2  6.5 - 8.1 g/dL Final  . Albumin 61/60/7371 4.1  3.5 - 5.0 g/dL Final   . AST 01/12/9484 19  15 - 41 U/L Final  . ALT 10/01/2020 18  0 - 44 U/L Final  . Alkaline Phosphatase 10/01/2020 48  38 - 126 U/L Final  . Total Bilirubin 10/01/2020 0.4  0.3 - 1.2 mg/dL Final  . GFR, Estimated 10/01/2020 47* >60 mL/min Final   Comment: (NOTE) Calculated using the CKD-EPI Creatinine Equation (2021)   . Anion gap 10/01/2020 9  5 - 15 Final   Performed at [facility], [address] [place], [place] [address]  . Prothrombin Time 10/01/2020 15.1  11.4 - 15.2 seconds Final  . INR 10/01/2020 1.2  0.8 - 1.2 Final   Comment: (NOTE) INR goal varies based on device and disease states. Performed at [facility], [address] [place], [place] [address]   . aPTT 10/01/2020 34  24 - 36 seconds Final   Performed at [facility], [address], [place], [place] [address]  . ABO/RH(D) 10/01/2020 O POS   Final  . Antibody Screen 10/01/2020  NEG   Final  . Sample Expiration 10/01/2020 10/11/2020,2359   Final  . Extend sample reason 10/01/2020    Final                   Value:NO TRANSFUSIONS OR PREGNANCY IN THE PAST 3 MONTHS Performed at [facility], [address], [place], [place] [address]      X-Rays:DG Pelvis Portable  Result Date: 10/08/2020 CLINICAL DATA:  Status post left hip replacement. EXAM: PORTABLE PELVIS 1-2 VIEWS COMPARISON:  None. FINDINGS: The left femoral and acetabular components are well situated. Expected postoperative changes are seen in the surrounding soft tissues. IMPRESSION: Status post left total hip arthroplasty. Electronically Signed   By: Lupita Raider M.D.   On: 10/08/2020 11:48   DG C-Arm 1-60 Min-No Report  Result Date: 10/08/2020 Fluoroscopy was utilized by the requesting physician.  No radiographic interpretation.   DG HIP OPERATIVE UNILAT W OR W/O PELVIS LEFT  Result Date: 10/08/2020 CLINICAL DATA:  LEFT hip replacement EXAM: OPERATIVE LEFT HIP (WITH PELVIS IF  PERFORMED) for VIEWS TECHNIQUE: Fluoroscopic spot image(s) were submitted for interpretation post-operatively. COMPARISON:  None FLUOROSCOPY TIME:  0 minutes 10 seconds FINDINGS: Removal of 3 cannulated screws from proximal LEFT femur. Resection of LEFT femoral head and neck with placement of a LEFT hip prosthesis. No fracture or dislocation. Bones appear demineralized. IMPRESSION: LEFT hip replacement without acute abnormalities. Electronically Signed   By: Ulyses Southward M.D.   On: 10/08/2020 12:46    EKG: Orders placed or performed in visit on 09/12/20  . EKG 12-Lead     Hospital Course: Ankit Degregorio is a 70 y.o. who was admitted to [facility]. They were brought to the operating room on 10/08/2020 and underwent Procedure(s): TOTAL HIP ARTHROPLASTY ANTERIOR APPROACH HARDWARE REMOVAL.  Patient tolerated the procedure well and was later transferred to the recovery room and then to the orthopaedic floor for postoperative care. They were given PO and IV analgesics for pain control following their surgery. They were given 24 hours of postoperative antibiotics of  Anti-infectives (From admission, onward)   Start     Dose/Rate Route Frequency Ordered Stop   10/08/20 1900  vancomycin (VANCOREADY) IVPB 1000 mg/200 mL        1,000 mg 200 mL/hr over 60 Minutes Intravenous Every 12 hours 10/08/20 1310 10/08/20 1902   10/08/20 0600  vancomycin (VANCOREADY) IVPB 1500 mg/300 mL        1,500 mg 150 mL/hr over 120 Minutes Intravenous On call to O.R. 10/07/20 1478 10/08/20 0827     and started on DVT prophylaxis in the form of Xarelto.   PT and OT were ordered for total joint protocol. Discharge planning consulted to help with postop disposition and equipment needs. Patient had a good night on the evening of surgery. They started to get up OOB with therapy on POD #0. Continued to work with therapy, he had slower progression with mobility and required a longer stay. Dressing was changed and the incision  was clean, dry and intact. Pt worked with therapy through the weekend and on Monday and was ready to go home. He was discharged to home later that day in stable condition.  Diet: Diabetic diet Activity: WBAT Follow-up: in 2 weeks Disposition: Home with HEP Discharged Condition: stable   Discharge Instructions    Call MD / Call 911   Complete by: As directed    If you experience chest pain or shortness of breath,  CALL 911 and be transported to the hospital emergency room.  If you develope a fever above 101 F, pus (white drainage) or increased drainage or redness at the wound, or calf pain, call your surgeon's office.   Change dressing   Complete by: As directed    You have an adhesive waterproof bandage over the incision. Leave this in place until your first follow-up appointment. Once you remove this you will not need to place another bandage.   Constipation Prevention   Complete by: As directed    Drink plenty of fluids.  Prune juice may be helpful.  You may use a stool softener, such as Colace (over the counter) 100 mg twice a day.  Use MiraLax (over the counter) for constipation as needed.   Diet - low sodium heart healthy   Complete by: As directed    Do not sit on low chairs, stoools or toilet seats, as it may be difficult to get up from low surfaces   Complete by: As directed    Driving restrictions   Complete by: As directed    No driving for two weeks   TED hose   Complete by: As directed    Use stockings (TED hose) for three weeks on both leg(s).  You may remove them at night for sleeping.   Weight bearing as tolerated   Complete by: As directed      Allergies as of 10/13/2020      Reactions   Penicillins Anaphylaxis   Sulfa Antibiotics Rash   Celebrex [celecoxib] Rash   Headache    Vioxx [rofecoxib] Rash      Medication List    STOP taking these medications   cholecalciferol 25 MCG (1000 UNIT) tablet Commonly known as: VITAMIN D3   multivitamin with minerals  tablet   vitamin C 250 MG tablet Commonly known as: ASCORBIC ACID     TAKE these medications   acetaminophen 500 MG tablet Commonly known as: TYLENOL Take 500 mg by mouth every 6 (six) hours as needed for headache.   docusate sodium 100 MG capsule Commonly known as: COLACE Take 100-200 mg by mouth daily as needed for mild constipation.   escitalopram 20 MG tablet Commonly known as: LEXAPRO Take 20 mg by mouth daily.   furosemide 20 MG tablet Commonly known as: LASIX Take 20 mg by mouth daily as needed for fluid.   gabapentin 100 MG capsule Commonly known as: NEURONTIN Take 200 mg by mouth 4 (four) times daily.   glipiZIDE 10 MG tablet Commonly known as: GLUCOTROL Take 10 mg by mouth 2 (two) times daily.   HYDROmorphone 2 MG tablet Commonly known as: DILAUDID Take 1-2 tablets (2-4 mg total) by mouth every 6 (six) hours as needed for severe pain.   insulin glargine 100 UNIT/ML Solostar Pen Commonly known as: LANTUS Inject 40 Units into the skin at bedtime.   lidocaine 5 % Commonly known as: LIDODERM Place 1 patch onto the skin daily as needed (pain).   Melatonin 10 MG Tabs Take 10 mg by mouth at bedtime.   methocarbamol 500 MG tablet Commonly known as: ROBAXIN Take 1 tablet (500 mg total) by mouth every 6 (six) hours as needed for muscle spasms.   metoprolol tartrate 25 MG tablet Commonly known as: LOPRESSOR Take 1 tablet (25 mg total) by mouth 2 (two) times daily.   oxyCODONE 15 MG immediate release tablet Commonly known as: ROXICODONE Take 15 mg by mouth 3 (three) times daily.  pioglitazone 30 MG tablet Commonly known as: ACTOS Take 30 mg by mouth daily.   Rivaroxaban 15 MG Tabs tablet Commonly known as: XARELTO Take 1 tablet (15 mg total) by mouth daily with supper.   simvastatin 40 MG tablet Commonly known as: ZOCOR Take 40 mg by mouth daily.   SYSTANE OP Place 1 drop into both eyes 2 (two) times daily as needed (dry eyes).   trandolapril 1  MG tablet Commonly known as: MAVIK Take 1 mg by mouth daily.   traZODone 100 MG tablet Commonly known as: DESYREL Take 100 mg by mouth at bedtime.   Trulicity 1.5 MG/0.5ML Sopn Generic drug: Dulaglutide Inject 1.5 mg into the muscle every Tuesday.   vitamin B-12 1000 MCG tablet Commonly known as: CYANOCOBALAMIN Take 1,000 mcg by mouth daily.            Discharge Care Instructions  (From admission, onward)         Start     Ordered   10/10/20 0000  Weight bearing as tolerated        10/10/20 0716   10/10/20 0000  Change dressing       Comments: You have an adhesive waterproof bandage over the incision. Leave this in place until your first follow-up appointment. Once you remove this you will not need to place another bandage.   10/10/20 0716          Follow-up Information    Ollen Gross, MD. Go on 10/21/2020.   Specialty: Orthopedic Surgery Why: You are scheduled for first post op appointment on Tuesday April 5th at 5:45pm. Contact information: [address] [place] [address] [place] [address] [phone]               Signed: Arther Abbott, PA-C Orthopedic Surgery 10/15/2020, 9:39 AM

## 2020-10-23 ENCOUNTER — Other Ambulatory Visit: Payer: Self-pay | Admitting: Cardiology

## 2020-10-23 DIAGNOSIS — I471 Supraventricular tachycardia: Secondary | ICD-10-CM

## 2020-11-11 DIAGNOSIS — Z96642 Presence of left artificial hip joint: Secondary | ICD-10-CM | POA: Diagnosis not present

## 2020-11-11 DIAGNOSIS — Z471 Aftercare following joint replacement surgery: Secondary | ICD-10-CM | POA: Diagnosis not present

## 2020-11-12 DIAGNOSIS — Z794 Long term (current) use of insulin: Secondary | ICD-10-CM | POA: Diagnosis not present

## 2020-11-12 DIAGNOSIS — E1122 Type 2 diabetes mellitus with diabetic chronic kidney disease: Secondary | ICD-10-CM | POA: Diagnosis not present

## 2020-11-12 DIAGNOSIS — N1832 Chronic kidney disease, stage 3b: Secondary | ICD-10-CM | POA: Diagnosis not present

## 2020-11-13 ENCOUNTER — Other Ambulatory Visit: Payer: Self-pay

## 2020-11-13 ENCOUNTER — Encounter: Payer: Self-pay | Admitting: Cardiology

## 2020-11-13 ENCOUNTER — Ambulatory Visit: Payer: Medicare Other | Admitting: Cardiology

## 2020-11-13 VITALS — BP 121/54 | HR 67 | Temp 98.2°F | Resp 16 | Ht 67.0 in | Wt 258.0 lb

## 2020-11-13 DIAGNOSIS — I5033 Acute on chronic diastolic (congestive) heart failure: Secondary | ICD-10-CM

## 2020-11-13 DIAGNOSIS — G4733 Obstructive sleep apnea (adult) (pediatric): Secondary | ICD-10-CM

## 2020-11-13 DIAGNOSIS — E782 Mixed hyperlipidemia: Secondary | ICD-10-CM | POA: Diagnosis not present

## 2020-11-13 DIAGNOSIS — G8929 Other chronic pain: Secondary | ICD-10-CM | POA: Diagnosis not present

## 2020-11-13 DIAGNOSIS — N183 Chronic kidney disease, stage 3 unspecified: Secondary | ICD-10-CM | POA: Diagnosis not present

## 2020-11-13 DIAGNOSIS — E1142 Type 2 diabetes mellitus with diabetic polyneuropathy: Secondary | ICD-10-CM | POA: Diagnosis not present

## 2020-11-13 DIAGNOSIS — M1612 Unilateral primary osteoarthritis, left hip: Secondary | ICD-10-CM | POA: Diagnosis not present

## 2020-11-13 DIAGNOSIS — R0989 Other specified symptoms and signs involving the circulatory and respiratory systems: Secondary | ICD-10-CM

## 2020-11-13 DIAGNOSIS — I739 Peripheral vascular disease, unspecified: Secondary | ICD-10-CM | POA: Diagnosis not present

## 2020-11-13 DIAGNOSIS — E1140 Type 2 diabetes mellitus with diabetic neuropathy, unspecified: Secondary | ICD-10-CM | POA: Diagnosis not present

## 2020-11-13 DIAGNOSIS — I480 Paroxysmal atrial fibrillation: Secondary | ICD-10-CM | POA: Diagnosis not present

## 2020-11-13 DIAGNOSIS — I4891 Unspecified atrial fibrillation: Secondary | ICD-10-CM | POA: Diagnosis not present

## 2020-11-13 MED ORDER — TORSEMIDE 20 MG PO TABS: 20.0000 mg | ORAL_TABLET | Freq: Every day | ORAL | 3 refills | Status: DC | PRN

## 2020-11-13 NOTE — Progress Notes (Signed)
Primary Physician/Referring:  Aura Dials, MD  Patient ID: Jason Huang, male    DOB: Dec 30, 1950, 70 y.o.   MRN: [id number]  Chief Complaint  Patient presents with  . Atrial Fibrillation  . PAD  . Follow-up    2 month   HPI:    Arius Harnois  is a 70 y.o. Caucasian male with degenerative joint disease, chronic back pain with extensive lumbar surgery in the remote past, diabetes mellitus with chronic stage III kidney disease with hyperkalemia and follows nephrology,  OSA on CPAP and peripheral neuropathy, found to have new onset atrial fibrillation during preoperative EKG in February 2022.   He also has severe peripheral arterial disease by physical exam especially involving right hip and right leg worse than the left.  He also has pseudoclaudication from spinal stenosis.  He now presents for follow-up of atrial fibrillation and peripheral arterial disease, underwent successful left hip arthroplasty and has recovered well.  He has not had any complications from surgery.  His main complaint is bilateral leg edema and mild worsening dyspnea.  He is accompanied by his wife at the bedside.  Denies chest pain or palpitations.   Past Medical History:  Diagnosis Date  . Anxiety   . Arthritis    back, wrists elbows knees  . Chronic kidney disease    stage 3  . Complication of anesthesia    narrow airway  . Depression   . Diabetes mellitus without complication ([place])   . Dysrhythmia    Afib  . Family history of adverse reaction to anesthesia    slow to wake up  . Hypertension   . Neuromuscular disorder (HCC)    neuropathy in feet  . Pneumonia   . Sleep apnea    CPAP   Past Surgical History:  Procedure Laterality Date  . BACK SURGERY    . HARDWARE REMOVAL Left 10/08/2020   Procedure: HARDWARE REMOVAL;  Surgeon: Gaynelle Arabian, MD;  Location: [facility];  Service: Orthopedics;  Laterality: Left;  . HIP PINNING Bilateral [id number]  . JOINT REPLACEMENT Right 2010  . KNEE  ARTHROSCOPY Right 2009  . NASAL SEPTOPLASTY W/ TURBINOPLASTY  1997  . SHOULDER ARTHROSCOPY Right 2008  . TONSILLECTOMY     as a child  . TOTAL HIP ARTHROPLASTY Left 10/08/2020   Procedure: TOTAL HIP ARTHROPLASTY ANTERIOR APPROACH;  Surgeon: Gaynelle Arabian, MD;  Location: [facility];  Service: Orthopedics;  Laterality: Left;  143mn   Family History  Problem Relation Age of Onset  . Colon cancer Mother   . Non-Hodgkin's lymphoma Father   . Colon cancer Brother     Social History   Tobacco Use  . Smoking status: Former Smoker    Packs/day: 0.50    Years: 15.00    Pack years: 7.50    Types: Cigarettes    Quit date: 2000    Years since quitting: 22.3  . Smokeless tobacco: Never Used  Substance Use Topics  . Alcohol use: Not Currently   Marital Status: Unknown  ROS  Review of Systems  Cardiovascular: Positive for claudication, dyspnea on exertion and leg swelling. Negative for chest pain.  Respiratory: Positive for snoring (OSA on CPAP).   Musculoskeletal: Positive for arthritis, back pain (major back surgery and now walks with cane), joint pain (left hip > right and bilateral knee) and muscle cramps.  Gastrointestinal: Negative for melena.   Objective  Blood pressure (!) 121/54, pulse 67, temperature 98.2 F (36.8 C), resp. rate 16, height _0  (  1.702 m), weight 258 lb (117 kg), SpO2 95 %.  Vitals with BMI 11/13/2020 10/13/2020 10/13/2020  Height _0  - -  Weight 258 lbs - -  BMI 96.7 - -  Systolic 893 810 175  Diastolic 54 73 70  Pulse 67 72 63     Physical Exam Constitutional:      Comments: Morbidly obese  Cardiovascular:     Rate and Rhythm: Regular rhythm. Frequent extrasystoles are present.    Pulses:          Carotid pulses are 2+ on the right side and 2+ on the left side.      Femoral pulses are 1+ on the right side with bruit and 0 on the left side with bruit.      Popliteal pulses are 0 on the right side and 0 on the left side.       Dorsalis pedis pulses are 0  on the right side and 0 on the left side.       Posterior tibial pulses are 0 on the right side and 0 on the left side.     Heart sounds: Heart sounds are distant. No murmur heard. No gallop.      Comments: 2 + bilateral pitting leg edema (new from previous). No JVD. Chronic venous stasis dermatitis changes noted.  Mild superficial varicose veins noted bilaterally. Musculoskeletal:     Cervical back: Normal range of motion.  Neurological:     Mental Status: He is alert.    Laboratory examination:   Recent Labs    10/01/20 1106 10/09/20 0253 10/10/20 0246  NA 138 136 136  K 4.7 4.8 3.7  CL 102 104 101  CO2 _1 GLUCOSE 171* 215* 236*  BUN 33* 34* 35*  CREATININE 1.58* 1.47* 1.24  CALCIUM 9.2 8.8* 8.7*  GFRNONAA 47* 51* >60   CrCl cannot be calculated (Patient's most recent lab result is older than the maximum 21 days allowed.).  CMP Latest Ref Rng & Units 10/10/2020 10/09/2020 10/01/2020  Glucose 70 - 99 mg/dL 236(H) 215(H) 171(H)  BUN 8 - 23 mg/dL 35(H) 34(H) 33(H)  Creatinine 0.61 - 1.24 mg/dL 1.24 1.47(H) 1.58(H)  Sodium 135 - 145 mmol/L 136 136 138  Potassium 3.5 - 5.1 mmol/L 3.7 4.8 4.7  Chloride 98 - 111 mmol/L 101 104 102  CO2 22 - 32 mmol/L _2 Calcium 8.9 - 10.3 mg/dL 8.7(L) 8.8(L) 9.2  Total Protein 6.5 - 8.1 g/dL - - 7.2  Total Bilirubin 0.3 - 1.2 mg/dL - - 0.4  Alkaline Phos 38 - 126 U/L - - 48  AST 15 - 41 U/L - - 19  ALT 0 - 44 U/L - - 18   CBC Latest Ref Rng & Units 10/11/2020 10/10/2020 10/09/2020  WBC 4.0 - 10.5 K/uL 11.0(H) 11.1(H) 9.3  Hemoglobin 13.0 - 17.0 g/dL 9.5(L) 10.0(L) 10.2(L)  Hematocrit 39.0 - 52.0 % 29.0(L) 30.6(L) 30.9(L)  Platelets 150 - 400 K/uL 141(L) 153 143(L)    Lipid Panel No results for input(s): CHOL, TRIG, LDLCALC, VLDL, HDL, CHOLHDL, LDLDIRECT in the last 8760 hours.  HEMOGLOBIN A1C Lab Results  Component Value Date   HGBA1C 6.5 (H) 10/01/2020   MPG 139.85 10/01/2020   TSH No results for input(s): TSH in the  last 8760 hours.  External labs:     Cholesterol, total 115.000 m 11/30/2019 HDL 42.000 mg 11/30/2019 LDL 44.000 mg 11/30/2019 Triglycerides 176.000 m 11/30/2019  A1C 5.800 %  08/11/2020   Labs 05/12/2020:  Vitamin D 42, intact PTH normal at 36.  Sodium 139, potassium 4.8, BUN 32, creatinine 1.66, EGFR 41 mL.  Hb 12.5/HCT 37.9, platelets 154.  Medications and allergies   Allergies  Allergen Reactions  . Penicillins Anaphylaxis  . Sulfa Antibiotics Rash  . Celebrex [Celecoxib] Rash    Headache   . Vioxx [Rofecoxib] Rash     Outpatient Medications Prior to Visit  Medication Sig Dispense Refill  . acetaminophen (TYLENOL) 500 MG tablet Take 500 mg by mouth every 6 (six) hours as needed for headache.    . Ascorbic Acid (VITAMIN C) 1000 MG tablet Take 1,000 mg by mouth daily.    Marland Kitchen docusate sodium (COLACE) 100 MG capsule Take 100-200 mg by mouth daily as needed for mild constipation.    Marland Kitchen escitalopram (LEXAPRO) 20 MG tablet Take 20 mg by mouth daily.  1  . furosemide (LASIX) 20 MG tablet Take 20 mg by mouth daily as needed for fluid.    Marland Kitchen gabapentin (NEURONTIN) 100 MG capsule Take 200 mg by mouth 4 (four) times daily.    Marland Kitchen glipiZIDE (GLUCOTROL) 10 MG tablet Take 10 mg by mouth 2 (two) times daily.  4  . HYDROmorphone (DILAUDID) 2 MG tablet Take 1-2 tablets (2-4 mg total) by mouth every 6 (six) hours as needed for severe pain. 42 tablet 0  . insulin glargine (LANTUS) 100 UNIT/ML Solostar Pen Inject 40 Units into the skin at bedtime.    . lidocaine (LIDODERM) 5 % Place 1 patch onto the skin daily as needed (pain).    . Melatonin 10 MG TABS Take 10 mg by mouth at bedtime.    . methocarbamol (ROBAXIN) 500 MG tablet Take 1 tablet (500 mg total) by mouth every 6 (six) hours as needed for muscle spasms. 40 tablet 0  . metoprolol tartrate (LOPRESSOR) 25 MG tablet Take 1 tablet (25 mg total) by mouth 2 (two) times daily. 60 tablet 3  . oxyCODONE (ROXICODONE) 15 MG immediate release tablet  Take 15 mg by mouth 3 (three) times daily.  0  . pioglitazone (ACTOS) 30 MG tablet Take 30 mg by mouth daily.    Vladimir Faster Glycol-Propyl Glycol (SYSTANE OP) Place 1 drop into both eyes 2 (two) times daily as needed (dry eyes).    . Rivaroxaban (XARELTO) 15 MG TABS tablet Take 1 tablet (15 mg total) by mouth daily with supper. 30 tablet 6  . simvastatin (ZOCOR) 40 MG tablet Take 40 mg by mouth daily.  4  . trandolapril (MAVIK) 1 MG tablet Take 1 mg by mouth daily.    . traZODone (DESYREL) 100 MG tablet Take 100 mg by mouth at bedtime.    . TRULICITY 1.5 TI/1.4ER SOPN Inject 1.5 mg into the muscle every Tuesday.  10  . vitamin B-12 (CYANOCOBALAMIN) 1000 MCG tablet Take 1,000 mcg by mouth daily.     No facility-administered medications prior to visit.    Radiology:   No results found.  Cardiac Studies:   PCV MYOCARDIAL PERFUSION WITH LEXISCAN 08/25/2020 Lexiscan nuclear stress test performed using 1-day protocol. Stress EKG is non-diagnostic, as this is pharmacological stress test. In addition, rest and stress EKG showed atrial fibrillation with controlled ventricular rate. Normal myocardial perfusion. Stress LVEF 72%. Low risk study.  PCV ECHOCARDIOGRAM COMPLETE 15/40/0867 Normal LV systolic function with visual EF 55-60%. Left ventricle cavity is normal in size. Mild left ventricular hypertrophy. Normal global wall motion. Indeterminate diastolic filling pattern, elevated LAP.  No significant valvular heart disease. No prior study for comparison.   Lower Extremity Arterial Duplex 09/04/2020:  Significant elevation of the peak systolic velocity is seen in the EIA and CFA suggestive of >50% stenosis. There is marked heterogeneous plaque noted. The right PT appears occluded. Remaining right lower extremity does not suggest a hemodynamically significant stenosis.  No significant elevation of the peak systolic velocity is seen in the left lower extremity to suggest a hemodynamically  significant stenosis.  This exam reveals mildly decreased perfusion of the right lower extremity, noted at the anterior tibial artery level (ABI 0.86) and mildly decreased perfusion of the left lower extremity, noted at the anterior tibal and post tibial artery level (ABI 0.86).   EKG:   EKG 11/13/2020: Sinus rhythm with first-degree AV block at rate of 68 bpm, normal axis, incomplete right bundle branch block.  PACs (3). Low-voltage complexes.    EKG 09/12/2020: Atrial fibrillation with controlled ventricular response at rate of 64 bpm, left axis deviation, left anterior fascicular block.  Incomplete right bundle branch block.  Low-voltage complexes.  Normal QT interval.   Compared to 08/20/2020, sinus with first-degree AV block.  And compared to 08/12/2020, 7 beat atrial tachycardia with underlying sinus with first-degree AV block.  Assessment     ICD-10-CM   1. Paroxysmal atrial fibrillation (HCC)  I48.0 EKG 12-Lead  2. PAD (peripheral artery disease) (HCC)  I73.9   3. Right carotid bruit  R09.89 PCV CAROTID DUPLEX (BILATERAL)  4. Acute on chronic diastolic heart failure (HCC)  I50.33 torsemide (DEMADEX) 20 MG tablet    CHA2DS2-VASc Score is 4.  Yearly risk of stroke: 4.8% (A, DM, HTN, Vasc Dz).  Score of 1=0.6; 2=2.2; 3=3.2; 4=4.8; 5=7.2; 6=9.8; 7=>9.8) -(CHF; HTN; vasc disease DM,  Male = 1; Age <65 =0; 65-74 = 1,  >75 =2; stroke/embolism= 2).    There are no discontinued medications.  Meds ordered this encounter  Medications  . torsemide (DEMADEX) 20 MG tablet    Sig: Take 1 tablet (20 mg total) by mouth daily as needed. For leg swelling    Dispense:  90 tablet    Refill:  3   Orders Placed This Encounter  Procedures  . EKG 12-Lead   Recommendations:   Bentzion Dauria is a 70 y.o. Caucasian male with degenerative joint disease, chronic back pain with extensive lumbar surgery in the remote past, diabetes mellitus with chronic stage III kidney disease with hyperkalemia and  follows nephrology,  OSA on CPAP and peripheral neuropathy, found to have new onset atrial fibrillation during preoperative EKG in February 2022.   He also has severe peripheral arterial disease by physical exam especially involving right hip and right leg worse than the left.  He also has pseudoclaudication from spinal stenosis.  He now presents for follow-up of atrial fibrillation and peripheral arterial disease.  He also complains of new onset leg edema along with dyspnea.  His leg edema and dyspnea is related to acute on chronic diastolic heart failure.  I will discontinue furosemide and switch him to torsemide 20 mg daily as needed.  Weight loss and salt restriction and fluid restriction discussed with the patient and his wife.  Discussed with him to maintain daily weights and bring the recordings to me.  He is back in sinus rhythm now, frequent PACs were noted.  Continue anticoagulation in view of high cardioembolic risk.  He has right carotid bruit which had missed on his last office visit and in view of  carotid artery bruit, will obtain carotid duplex.  Patient has obstructive sleep apnea and has been on CPAP however he has not been evaluated for >3 years since he moved to [place].  I will refer him to be evaluated by Dr. Nehemiah Settle for the same.  Especially in view of paroxysmal atrial fibrillation, diastolic heart failure, it would be prudent to have appropriate settings.  He has severe peripheral arterial disease and is symptomatic but his symptoms of leg pain are multifactorial in etiology including pseudoclaudication from spinal stenosis, sciatica, degenerative joint disease as well.  But his symptoms of right hip pain do suggest claudication as well.  However this can wait for further evaluation after he recovers from left hip arthroplasty. I will see him back in 2 months for follow-up.    This was a 40-minute office visit encounter again reviewing his hospital data, coordination of  care and discussions regarding multiple complex medical issues.   Adrian Prows, MD, [facility] 11/13/2020, 10:18 AM Office: [phone] Pager: [phone]

## 2020-11-14 DIAGNOSIS — N1831 Chronic kidney disease, stage 3a: Secondary | ICD-10-CM | POA: Diagnosis not present

## 2020-11-14 DIAGNOSIS — I129 Hypertensive chronic kidney disease with stage 1 through stage 4 chronic kidney disease, or unspecified chronic kidney disease: Secondary | ICD-10-CM | POA: Diagnosis not present

## 2020-11-14 DIAGNOSIS — M9080 Osteopathy in diseases classified elsewhere, unspecified site: Secondary | ICD-10-CM | POA: Diagnosis not present

## 2020-11-14 DIAGNOSIS — D631 Anemia in chronic kidney disease: Secondary | ICD-10-CM | POA: Diagnosis not present

## 2020-11-14 DIAGNOSIS — Z6841 Body Mass Index (BMI) 40.0 and over, adult: Secondary | ICD-10-CM | POA: Diagnosis not present

## 2020-11-14 DIAGNOSIS — E1122 Type 2 diabetes mellitus with diabetic chronic kidney disease: Secondary | ICD-10-CM | POA: Diagnosis not present

## 2020-11-14 DIAGNOSIS — M7989 Other specified soft tissue disorders: Secondary | ICD-10-CM | POA: Insufficient documentation

## 2020-11-14 DIAGNOSIS — E889 Metabolic disorder, unspecified: Secondary | ICD-10-CM | POA: Diagnosis not present

## 2020-11-14 DIAGNOSIS — R801 Persistent proteinuria, unspecified: Secondary | ICD-10-CM | POA: Diagnosis not present

## 2020-11-14 DIAGNOSIS — N183 Chronic kidney disease, stage 3 unspecified: Secondary | ICD-10-CM | POA: Diagnosis not present

## 2020-11-14 DIAGNOSIS — Z794 Long term (current) use of insulin: Secondary | ICD-10-CM | POA: Diagnosis not present

## 2020-11-21 ENCOUNTER — Ambulatory Visit: Payer: Medicare Other

## 2020-11-21 ENCOUNTER — Other Ambulatory Visit: Payer: Self-pay

## 2020-11-21 DIAGNOSIS — R0989 Other specified symptoms and signs involving the circulatory and respiratory systems: Secondary | ICD-10-CM | POA: Diagnosis not present

## 2020-11-23 ENCOUNTER — Other Ambulatory Visit: Payer: Self-pay | Admitting: Cardiology

## 2020-11-23 DIAGNOSIS — I6523 Occlusion and stenosis of bilateral carotid arteries: Secondary | ICD-10-CM

## 2020-11-23 NOTE — Progress Notes (Signed)
Mild bilateral carotid disease and will discuss on OV soon

## 2020-12-16 DIAGNOSIS — G8929 Other chronic pain: Secondary | ICD-10-CM | POA: Diagnosis not present

## 2020-12-16 DIAGNOSIS — M1612 Unilateral primary osteoarthritis, left hip: Secondary | ICD-10-CM | POA: Diagnosis not present

## 2020-12-16 DIAGNOSIS — E1142 Type 2 diabetes mellitus with diabetic polyneuropathy: Secondary | ICD-10-CM | POA: Diagnosis not present

## 2020-12-16 DIAGNOSIS — E782 Mixed hyperlipidemia: Secondary | ICD-10-CM | POA: Diagnosis not present

## 2020-12-16 DIAGNOSIS — E1140 Type 2 diabetes mellitus with diabetic neuropathy, unspecified: Secondary | ICD-10-CM | POA: Diagnosis not present

## 2020-12-16 DIAGNOSIS — I4891 Unspecified atrial fibrillation: Secondary | ICD-10-CM | POA: Diagnosis not present

## 2020-12-16 DIAGNOSIS — N183 Chronic kidney disease, stage 3 unspecified: Secondary | ICD-10-CM | POA: Diagnosis not present

## 2020-12-26 ENCOUNTER — Other Ambulatory Visit: Payer: Self-pay

## 2020-12-26 ENCOUNTER — Ambulatory Visit: Payer: Medicare Other | Admitting: Cardiology

## 2020-12-26 ENCOUNTER — Encounter: Payer: Self-pay | Admitting: Cardiology

## 2020-12-26 VITALS — BP 129/59 | HR 63 | Temp 98.7°F | Ht 67.0 in | Wt 246.0 lb

## 2020-12-26 DIAGNOSIS — Z794 Long term (current) use of insulin: Secondary | ICD-10-CM | POA: Diagnosis not present

## 2020-12-26 DIAGNOSIS — E1122 Type 2 diabetes mellitus with diabetic chronic kidney disease: Secondary | ICD-10-CM | POA: Diagnosis not present

## 2020-12-26 DIAGNOSIS — I480 Paroxysmal atrial fibrillation: Secondary | ICD-10-CM | POA: Diagnosis not present

## 2020-12-26 DIAGNOSIS — I739 Peripheral vascular disease, unspecified: Secondary | ICD-10-CM | POA: Diagnosis not present

## 2020-12-26 DIAGNOSIS — I6523 Occlusion and stenosis of bilateral carotid arteries: Secondary | ICD-10-CM

## 2020-12-26 DIAGNOSIS — N1832 Chronic kidney disease, stage 3b: Secondary | ICD-10-CM | POA: Diagnosis not present

## 2020-12-26 NOTE — Progress Notes (Signed)
Primary Physician/Referring:  Aura Dials, MD  Patient ID: Jason Huang, male    DOB: June 16, 1951, 70 y.o.   MRN: [id number]  Chief Complaint  Patient presents with   Atrial Fibrillation   Follow-up   HPI:    Jason Huang  is a 70 y.o. Caucasian male with degenerative joint disease, chronic back pain with extensive lumbar surgery in the remote past, diabetes mellitus with chronic stage III kidney disease with hyperkalemia and follows nephrology,  OSA on CPAP and peripheral neuropathy, found to have new onset atrial fibrillation during preoperative EKG in February 2022.   He also has severe peripheral arterial disease by physical exam especially involving right hip and right leg worse than the left.  He also has pseudoclaudication from spinal stenosis.  On his last office visit due to worsening leg edema and dyspnea, suspected acute on chronic diastolic heart failure, and started him on torsemide.  He now presents for follow-up.  States that his dyspnea is improving, leg edema is essentially completely resolved.  He has not had any chest pain.  He still continues to have mild symptoms of hip pain, left hip arthroplasty site has healed well and he has started to do more.  He also has numbness in his feet bilaterally and is wondering whether PAD is contributing.  He is accompanied by his wife at the bedside Denies chest pain or palpitations.   Past Medical History:  Diagnosis Date   Anxiety    Arthritis    back, wrists elbows knees   Chronic kidney disease    stage 3   Complication of anesthesia    narrow airway   Depression    Diabetes mellitus without complication (HCC)    Dysrhythmia    Afib   Family history of adverse reaction to anesthesia    slow to wake up   Hypertension    Neuromuscular disorder (HCC)    neuropathy in feet   Pneumonia    Sleep apnea    CPAP   Past Surgical History:  Procedure Laterality Date   BACK SURGERY     HARDWARE REMOVAL Left 10/08/2020    Procedure: HARDWARE REMOVAL;  Surgeon: Gaynelle Arabian, MD;  Location: [facility];  Service: Orthopedics;  Laterality: Left;   HIP PINNING Bilateral [id number]   JOINT REPLACEMENT Right 2010   KNEE ARTHROSCOPY Right 2009   NASAL SEPTOPLASTY W/ TURBINOPLASTY  1997   SHOULDER ARTHROSCOPY Right 2008   TONSILLECTOMY     as a child   TOTAL HIP ARTHROPLASTY Left 10/08/2020   Procedure: TOTAL HIP ARTHROPLASTY ANTERIOR APPROACH;  Surgeon: Gaynelle Arabian, MD;  Location: [facility];  Service: Orthopedics;  Laterality: Left;  111mn   Family History  Problem Relation Age of Onset   Colon cancer Mother    Non-Hodgkin's lymphoma Father    Colon cancer Brother     Social History   Tobacco Use   Smoking status: Former    Packs/day: 0.50    Years: 15.00    Pack years: 7.50    Types: Cigarettes    Quit date: 2000    Years since quitting: 22.4   Smokeless tobacco: Never  Substance Use Topics   Alcohol use: Not Currently   Marital Status: Unknown  ROS  Review of Systems  Cardiovascular:  Positive for claudication and dyspnea on exertion. Negative for chest pain and leg swelling.  Respiratory:  Positive for snoring (OSA on CPAP).   Musculoskeletal:  Positive for arthritis, back pain (major back surgery  and now walks with cane), joint pain (left hip > right and bilateral knee) and muscle cramps.  Gastrointestinal:  Negative for melena.  Objective  Blood pressure (!) 129/59, pulse 63, temperature 98.7 F (37.1 C), height 5' 7"  (1.702 m), weight 246 lb (111.6 kg), SpO2 97 %.  Vitals with BMI 12/26/2020 11/13/2020 10/13/2020  Height 5' 7"  5' 7"  -  Weight 246 lbs 258 lbs -  BMI 15.37 94.3 -  Systolic 276 147 092  Diastolic 59 54 73  Pulse 63 67 72     Physical Exam Constitutional:      Comments: Morbidly obese  Neck:     Vascular: No carotid bruit or JVD.  Cardiovascular:     Rate and Rhythm: Regular rhythm.     Pulses:          Carotid pulses are 2+ on the right side and 2+ on the left side.       Femoral pulses are 1+ on the right side with bruit and 0 on the left side with bruit.      Popliteal pulses are 0 on the right side and 0 on the left side.       Dorsalis pedis pulses are 0 on the right side and 0 on the left side.       Posterior tibial pulses are 0 on the right side and 0 on the left side.     Heart sounds: Heart sounds are distant. No murmur heard.   No gallop.     Comments: Chronic venous stasis dermatitis changes noted.  Mild superficial varicose veins noted bilaterally. Pulmonary:     Effort: Pulmonary effort is normal.     Breath sounds: Normal breath sounds.  Abdominal:     General: Abdomen is flat.     Palpations: Abdomen is soft.  Musculoskeletal:        General: No swelling.     Cervical back: Normal range of motion.  Skin:    General: Skin is warm.  Neurological:     General: No focal deficit present.     Mental Status: He is alert.   Laboratory examination:   Recent Labs    10/01/20 1106 10/09/20 0253 10/10/20 0246  NA 138 136 136  K 4.7 4.8 3.7  CL 102 104 101  CO2 27 25 26   GLUCOSE 171* 215* 236*  BUN 33* 34* 35*  CREATININE 1.58* 1.47* 1.24  CALCIUM 9.2 8.8* 8.7*  GFRNONAA 47* 51* >60   CrCl cannot be calculated (Patient's most recent lab result is older than the maximum 21 days allowed.).  CMP Latest Ref Rng & Units 10/10/2020 10/09/2020 10/01/2020  Glucose 70 - 99 mg/dL 236(H) 215(H) 171(H)  BUN 8 - 23 mg/dL 35(H) 34(H) 33(H)  Creatinine 0.61 - 1.24 mg/dL 1.24 1.47(H) 1.58(H)  Sodium 135 - 145 mmol/L 136 136 138  Potassium 3.5 - 5.1 mmol/L 3.7 4.8 4.7  Chloride 98 - 111 mmol/L 101 104 102  CO2 22 - 32 mmol/L 26 25 27   Calcium 8.9 - 10.3 mg/dL 8.7(L) 8.8(L) 9.2  Total Protein 6.5 - 8.1 g/dL - - 7.2  Total Bilirubin 0.3 - 1.2 mg/dL - - 0.4  Alkaline Phos 38 - 126 U/L - - 48  AST 15 - 41 U/L - - 19  ALT 0 - 44 U/L - - 18   CBC Latest Ref Rng & Units 10/11/2020 10/10/2020 10/09/2020  WBC 4.0 - 10.5 K/uL 11.0(H) 11.1(H) 9.3   Hemoglobin 13.0 -  17.0 g/dL 9.5(L) 10.0(L) 10.2(L)  Hematocrit 39.0 - 52.0 % 29.0(L) 30.6(L) 30.9(L)  Platelets 150 - 400 K/uL 141(L) 153 143(L)    Lipid Panel No results for input(s): CHOL, TRIG, LDLCALC, VLDL, HDL, CHOLHDL, LDLDIRECT in the last 8760 hours.  HEMOGLOBIN A1C Lab Results  Component Value Date   HGBA1C 6.5 (H) 10/01/2020   MPG 139.85 10/01/2020   TSH No results for input(s): TSH in the last 8760 hours.  BNP (last 3 results) No results for input(s): BNP in the last 8760 hours.  ProBNP (last 3 results) No results for input(s): PROBNP in the last 8760 hours.   External labs:     Cholesterol, total 115.000 m 11/30/2019 HDL 42.000 mg 11/30/2019 LDL 44.000 mg 11/30/2019 Triglycerides 176.000 m 11/30/2019  A1C 5.800 % 08/11/2020   Labs 05/12/2020:  Vitamin D 42, intact PTH normal at 36.  Sodium 139, potassium 4.8, BUN 32, creatinine 1.66, EGFR 41 mL.  Hb 12.5/HCT 37.9, platelets 154.  Medications and allergies   Allergies  Allergen Reactions   Penicillins Anaphylaxis   Sulfa Antibiotics Rash   Celebrex [Celecoxib] Rash    Headache    Vioxx [Rofecoxib] Rash     Outpatient Medications Prior to Visit  Medication Sig Dispense Refill   acetaminophen (TYLENOL) 500 MG tablet Take 500 mg by mouth every 6 (six) hours as needed for headache.     Ascorbic Acid (VITAMIN C) 1000 MG tablet Take 1,000 mg by mouth daily.     docusate sodium (COLACE) 100 MG capsule Take 100-200 mg by mouth daily as needed for mild constipation.     escitalopram (LEXAPRO) 20 MG tablet Take 20 mg by mouth daily.  1   gabapentin (NEURONTIN) 100 MG capsule Take 200 mg by mouth 4 (four) times daily.     glipiZIDE (GLUCOTROL) 10 MG tablet Take 10 mg by mouth 2 (two) times daily.  4   HYDROmorphone (DILAUDID) 2 MG tablet Take 1-2 tablets (2-4 mg total) by mouth every 6 (six) hours as needed for severe pain. 42 tablet 0   insulin glargine (LANTUS) 100 UNIT/ML Solostar Pen Inject 40 Units  into the skin at bedtime.     lidocaine (LIDODERM) 5 % Place 1 patch onto the skin daily as needed (pain).     Melatonin 10 MG TABS Take 10 mg by mouth at bedtime.     methocarbamol (ROBAXIN) 500 MG tablet Take 1 tablet (500 mg total) by mouth every 6 (six) hours as needed for muscle spasms. 40 tablet 0   metoprolol tartrate (LOPRESSOR) 25 MG tablet Take 1 tablet (25 mg total) by mouth 2 (two) times daily. 60 tablet 3   oxyCODONE (ROXICODONE) 15 MG immediate release tablet Take 15 mg by mouth 3 (three) times daily.  0   pioglitazone (ACTOS) 30 MG tablet Take 30 mg by mouth daily.     Polyethyl Glycol-Propyl Glycol (SYSTANE OP) Place 1 drop into both eyes 2 (two) times daily as needed (dry eyes).     Rivaroxaban (XARELTO) 15 MG TABS tablet Take 1 tablet (15 mg total) by mouth daily with supper. 30 tablet 6   simvastatin (ZOCOR) 40 MG tablet Take 1 tablet by mouth every evening.     torsemide (DEMADEX) 20 MG tablet Take 1 tablet (20 mg total) by mouth daily as needed. For leg swelling 90 tablet 3   trandolapril (MAVIK) 1 MG tablet Take 1 mg by mouth daily.     traZODone (DESYREL) 100 MG tablet Take  100 mg by mouth at bedtime.     TRULICITY 1.5 ZO/1.0RU SOPN Inject 1.5 mg into the muscle every Tuesday.  10   vitamin B-12 (CYANOCOBALAMIN) 1000 MCG tablet Take 1,000 mcg by mouth daily.     oxyCODONE (ROXICODONE) 15 MG immediate release tablet 1 tablet     furosemide (LASIX) 20 MG tablet furosemide 20 mg tablet     Melatonin 3 MG CAPS Take by mouth.     simvastatin (ZOCOR) 40 MG tablet Take 40 mg by mouth daily.  4   No facility-administered medications prior to visit.   Final Medication list after present encounter: Current Outpatient Medications  Medication Instructions   acetaminophen (TYLENOL) 500 mg, Oral, Every 6 hours PRN   docusate sodium (COLACE) 100-200 mg, Oral, Daily PRN   escitalopram (LEXAPRO) 20 mg, Oral, Daily   gabapentin (NEURONTIN) 200 mg, Oral, 4 times daily   glipiZIDE  (GLUCOTROL) 10 mg, Oral, 2 times daily   HYDROmorphone (DILAUDID) 2-4 mg, Oral, Every 6 hours PRN   insulin glargine (LANTUS) 40 Units, Subcutaneous, Daily at bedtime   lidocaine (LIDODERM) 5 % 1 patch, Transdermal, Daily PRN   Melatonin 10 mg, Oral, Daily at bedtime   methocarbamol (ROBAXIN) 500 mg, Oral, Every 6 hours PRN   metoprolol tartrate (LOPRESSOR) 25 mg, Oral, 2 times daily   oxyCODONE (ROXICODONE) 15 mg, Oral, 3 times daily   pioglitazone (ACTOS) 30 mg, Oral, Daily   Polyethyl Glycol-Propyl Glycol (SYSTANE OP) 1 drop, Both Eyes, 2 times daily PRN   Rivaroxaban (XARELTO) 15 mg, Oral, Daily with supper   simvastatin (ZOCOR) 40 MG tablet 1 tablet, Oral, Every evening   torsemide (DEMADEX) 20 mg, Oral, Daily PRN, For leg swelling   trandolapril (MAVIK) 1 mg, Oral, Daily   traZODone (DESYREL) 100 mg, Oral, Daily at bedtime   Trulicity 1.5 mg, Intramuscular, Every Tue   vitamin B-12 (CYANOCOBALAMIN) 1,000 mcg, Oral, Daily   vitamin C 1,000 mg, Oral, Daily    Radiology:   No results found.  Cardiac Studies:   PCV MYOCARDIAL PERFUSION WITH LEXISCAN 08/25/2020 Lexiscan nuclear stress test performed using 1-day protocol. Stress EKG is non-diagnostic, as this is pharmacological stress test. In addition, rest and stress EKG showed atrial fibrillation with controlled ventricular rate. Normal myocardial perfusion. Stress LVEF 72%. Low risk study.  PCV ECHOCARDIOGRAM COMPLETE 04/54/0981 Normal LV systolic function with visual EF 55-60%. Left ventricle cavity is normal in size. Mild left ventricular hypertrophy. Normal global wall motion. Indeterminate diastolic filling pattern, elevated LAP. No significant valvular heart disease. No prior study for comparison.   Lower Extremity Arterial Duplex 09/04/2020:  Significant elevation of the peak systolic velocity is seen in the EIA and CFA suggestive of >50% stenosis. There is marked heterogeneous plaque noted. The right PT appears  occluded. Remaining right lower extremity does not suggest a hemodynamically significant stenosis.  No significant elevation of the peak systolic velocity is seen in the left lower extremity to suggest a hemodynamically significant stenosis.  This exam reveals mildly decreased perfusion of the right lower extremity, noted at the anterior tibial artery level (ABI 0.86) and mildly decreased perfusion of the left lower extremity, noted at the anterior tibal and post tibial artery level (ABI 0.86).   Carotid artery duplex 11/21/2020: Stenosis in the right internal carotid artery (16-49%). Homogeneous plaque noted. Stenosis in the right external carotid artery (<50%). Stenosis in the left internal carotid artery (16-49%). Stenosis in the left external carotid artery (<50%). Heterogeneous plaque noted. Antegrade  right vertebral artery flow. Antegrade left vertebral artery flow. Follow up in one year is appropriate if clinically indicated.  EKG:   EKG 12/26/2020: Sinus rhythm with first-degree AV block at rate of 60 bpm, PR interval 300 ms.  Normal axis.  Incomplete right bundle branch block.  Nonspecific T abnormality.  Single PAC.  No significant change from 11/13/2020.  EKG 09/12/2020: Atrial fibrillation with controlled ventricular response at rate of 64 bpm, left axis deviation, left anterior fascicular block.  Incomplete right bundle branch block.  Low-voltage complexes.  Normal QT interval.   Compared to 08/20/2020, sinus with first-degree AV block.  And compared to 08/12/2020, 7 beat atrial tachycardia with underlying sinus with first-degree AV block.  Assessment     ICD-10-CM   1. Paroxysmal atrial fibrillation (HCC)  I48.0 EKG 12-Lead    2. Asymptomatic bilateral carotid artery stenosis  I65.23     3. PAD (peripheral artery disease) (HCC)  I73.9     4. Type 2 diabetes mellitus with stage 3b chronic kidney disease, with long-term current use of insulin (HCC)  E11.22    N18.32    Z79.4        CHA2DS2-VASc Score is 4.  Yearly risk of stroke: 4.8% (A, DM, HTN, Vasc Dz).  Score of 1=0.6; 2=2.2; 3=3.2; 4=4.8; 5=7.2; 6=9.8; 7=>9.8) -(CHF; HTN; vasc disease DM,  Male = 1; Age <65 =0; 65-74 = 1,  >75 =2; stroke/embolism= 2).    Medications Discontinued During This Encounter  Medication Reason   simvastatin (ZOCOR) 40 MG tablet Error   furosemide (LASIX) 20 MG tablet Error   Melatonin 3 MG CAPS Error   oxyCODONE (ROXICODONE) 15 MG immediate release tablet Error    No orders of the defined types were placed in this encounter.  Orders Placed This Encounter  Procedures   EKG 12-Lead   Recommendations:   Jason Huang is a 70 y.o. Caucasian male with degenerative joint disease, chronic back pain with extensive lumbar surgery in the remote past, diabetes mellitus with chronic stage III kidney disease with hyperkalemia and follows nephrology,  OSA on CPAP and peripheral neuropathy, found to have new onset atrial fibrillation during preoperative EKG in February 2022.   He also has severe peripheral arterial disease by physical exam especially involving right hip and right leg worse than the left.  He also has pseudoclaudication from spinal stenosis.  Since last office visit he has had complete resolution of leg edema and dyspnea has improved.  Advised him to take torsemide on a as needed basis only if there is >2 pound weight gain or leg edema or worsening dyspnea.  I have discussed with him regarding enrolling in "Transcend" trial, with loop implantation for heart failure management.  With regard to management of diabetes mellitus, he is presently on Actos, would consider discontinuing this in view of fluid overload state.  Consider Jardiance or [place].  He will follow-up with his PCP regarding this.  He will follow-up with Dr. Nehemiah Settle regarding obstructive sleep apnea and compliance discussed with the patient.  He is presently maintaining sinus rhythm, continue  anticoagulation for now in view of high cardioembolic risk.  With regard to peripheral arterial disease, advised him to continue to increase his physical activity as tolerated, his symptoms of right hip claudication is stable bilateral feet numbness, heaviness with activity is probably also related to component of peripheral neuropathy.  With regard to carotid disease, he has mild asymptomatic carotid artery stenosis and will continue annual surveillance  for now.  Otherwise remained stable, I would like to see him back in 6 months for follow-up.    Adrian Prows, MD, [facility] 12/26/2020, 1:13 PM Office: [phone] Pager: [phone]

## 2020-12-29 ENCOUNTER — Other Ambulatory Visit: Payer: Self-pay

## 2020-12-29 DIAGNOSIS — I5033 Acute on chronic diastolic (congestive) heart failure: Secondary | ICD-10-CM

## 2021-01-21 DIAGNOSIS — G4733 Obstructive sleep apnea (adult) (pediatric): Secondary | ICD-10-CM | POA: Diagnosis not present

## 2021-01-22 DIAGNOSIS — I5033 Acute on chronic diastolic (congestive) heart failure: Secondary | ICD-10-CM | POA: Diagnosis not present

## 2021-01-23 LAB — PRO B NATRIURETIC PEPTIDE: NT-Pro BNP: 222 pg/mL (ref 0–376)

## 2021-01-23 NOTE — Progress Notes (Signed)
proBNP low at 222 ([phone]), does not suggest heart failure.  Will discuss on office visit.

## 2021-02-04 DIAGNOSIS — Z0000 Encounter for general adult medical examination without abnormal findings: Secondary | ICD-10-CM | POA: Diagnosis not present

## 2021-02-04 DIAGNOSIS — Z1389 Encounter for screening for other disorder: Secondary | ICD-10-CM | POA: Diagnosis not present

## 2021-02-04 DIAGNOSIS — N183 Chronic kidney disease, stage 3 unspecified: Secondary | ICD-10-CM | POA: Diagnosis not present

## 2021-02-04 DIAGNOSIS — G8929 Other chronic pain: Secondary | ICD-10-CM | POA: Diagnosis not present

## 2021-02-04 DIAGNOSIS — M1612 Unilateral primary osteoarthritis, left hip: Secondary | ICD-10-CM | POA: Diagnosis not present

## 2021-02-04 DIAGNOSIS — I4891 Unspecified atrial fibrillation: Secondary | ICD-10-CM | POA: Diagnosis not present

## 2021-02-04 DIAGNOSIS — E1140 Type 2 diabetes mellitus with diabetic neuropathy, unspecified: Secondary | ICD-10-CM | POA: Diagnosis not present

## 2021-02-04 DIAGNOSIS — E1142 Type 2 diabetes mellitus with diabetic polyneuropathy: Secondary | ICD-10-CM | POA: Diagnosis not present

## 2021-02-04 DIAGNOSIS — E782 Mixed hyperlipidemia: Secondary | ICD-10-CM | POA: Diagnosis not present

## 2021-02-11 ENCOUNTER — Other Ambulatory Visit: Payer: Self-pay | Admitting: Cardiology

## 2021-02-11 DIAGNOSIS — I471 Supraventricular tachycardia: Secondary | ICD-10-CM

## 2021-02-17 DIAGNOSIS — N1831 Chronic kidney disease, stage 3a: Secondary | ICD-10-CM | POA: Diagnosis not present

## 2021-02-17 DIAGNOSIS — E1122 Type 2 diabetes mellitus with diabetic chronic kidney disease: Secondary | ICD-10-CM | POA: Diagnosis not present

## 2021-02-17 DIAGNOSIS — Z794 Long term (current) use of insulin: Secondary | ICD-10-CM | POA: Diagnosis not present

## 2021-02-17 DIAGNOSIS — R801 Persistent proteinuria, unspecified: Secondary | ICD-10-CM | POA: Diagnosis not present

## 2021-02-19 DIAGNOSIS — Z794 Long term (current) use of insulin: Secondary | ICD-10-CM | POA: Diagnosis not present

## 2021-02-19 DIAGNOSIS — I129 Hypertensive chronic kidney disease with stage 1 through stage 4 chronic kidney disease, or unspecified chronic kidney disease: Secondary | ICD-10-CM | POA: Diagnosis not present

## 2021-02-19 DIAGNOSIS — N1831 Chronic kidney disease, stage 3a: Secondary | ICD-10-CM | POA: Diagnosis not present

## 2021-02-19 DIAGNOSIS — D631 Anemia in chronic kidney disease: Secondary | ICD-10-CM | POA: Diagnosis not present

## 2021-02-19 DIAGNOSIS — R801 Persistent proteinuria, unspecified: Secondary | ICD-10-CM | POA: Diagnosis not present

## 2021-02-19 DIAGNOSIS — M9080 Osteopathy in diseases classified elsewhere, unspecified site: Secondary | ICD-10-CM | POA: Diagnosis not present

## 2021-02-19 DIAGNOSIS — E889 Metabolic disorder, unspecified: Secondary | ICD-10-CM | POA: Diagnosis not present

## 2021-02-19 DIAGNOSIS — N183 Chronic kidney disease, stage 3 unspecified: Secondary | ICD-10-CM | POA: Diagnosis not present

## 2021-02-19 DIAGNOSIS — E1122 Type 2 diabetes mellitus with diabetic chronic kidney disease: Secondary | ICD-10-CM | POA: Diagnosis not present

## 2021-03-17 DIAGNOSIS — Z794 Long term (current) use of insulin: Secondary | ICD-10-CM | POA: Diagnosis not present

## 2021-03-17 DIAGNOSIS — G4733 Obstructive sleep apnea (adult) (pediatric): Secondary | ICD-10-CM | POA: Diagnosis not present

## 2021-03-17 DIAGNOSIS — E782 Mixed hyperlipidemia: Secondary | ICD-10-CM | POA: Diagnosis not present

## 2021-03-17 DIAGNOSIS — N183 Chronic kidney disease, stage 3 unspecified: Secondary | ICD-10-CM | POA: Diagnosis not present

## 2021-03-17 DIAGNOSIS — Z23 Encounter for immunization: Secondary | ICD-10-CM | POA: Diagnosis not present

## 2021-03-17 DIAGNOSIS — M5136 Other intervertebral disc degeneration, lumbar region: Secondary | ICD-10-CM | POA: Diagnosis not present

## 2021-03-17 DIAGNOSIS — G63 Polyneuropathy in diseases classified elsewhere: Secondary | ICD-10-CM | POA: Diagnosis not present

## 2021-03-17 DIAGNOSIS — I4891 Unspecified atrial fibrillation: Secondary | ICD-10-CM | POA: Diagnosis not present

## 2021-03-17 DIAGNOSIS — E1140 Type 2 diabetes mellitus with diabetic neuropathy, unspecified: Secondary | ICD-10-CM | POA: Diagnosis not present

## 2021-03-27 DIAGNOSIS — E1140 Type 2 diabetes mellitus with diabetic neuropathy, unspecified: Secondary | ICD-10-CM | POA: Diagnosis not present

## 2021-03-27 DIAGNOSIS — Z125 Encounter for screening for malignant neoplasm of prostate: Secondary | ICD-10-CM | POA: Diagnosis not present

## 2021-03-27 DIAGNOSIS — Z7984 Long term (current) use of oral hypoglycemic drugs: Secondary | ICD-10-CM | POA: Diagnosis not present

## 2021-03-27 DIAGNOSIS — Z794 Long term (current) use of insulin: Secondary | ICD-10-CM | POA: Diagnosis not present

## 2021-03-27 DIAGNOSIS — E782 Mixed hyperlipidemia: Secondary | ICD-10-CM | POA: Diagnosis not present

## 2021-04-02 ENCOUNTER — Encounter: Payer: Self-pay | Admitting: Cardiology

## 2021-05-01 DIAGNOSIS — E113292 Type 2 diabetes mellitus with mild nonproliferative diabetic retinopathy without macular edema, left eye: Secondary | ICD-10-CM | POA: Diagnosis not present

## 2021-05-01 DIAGNOSIS — H524 Presbyopia: Secondary | ICD-10-CM | POA: Diagnosis not present

## 2021-05-01 DIAGNOSIS — H25012 Cortical age-related cataract, left eye: Secondary | ICD-10-CM | POA: Diagnosis not present

## 2021-05-07 DIAGNOSIS — G4733 Obstructive sleep apnea (adult) (pediatric): Secondary | ICD-10-CM | POA: Diagnosis not present

## 2021-05-12 ENCOUNTER — Ambulatory Visit: Payer: Medicare Other | Admitting: Rehabilitative and Restorative Service Providers"

## 2021-05-18 DIAGNOSIS — I4891 Unspecified atrial fibrillation: Secondary | ICD-10-CM | POA: Diagnosis not present

## 2021-05-18 DIAGNOSIS — G8929 Other chronic pain: Secondary | ICD-10-CM | POA: Diagnosis not present

## 2021-05-18 DIAGNOSIS — E1142 Type 2 diabetes mellitus with diabetic polyneuropathy: Secondary | ICD-10-CM | POA: Diagnosis not present

## 2021-05-18 DIAGNOSIS — E782 Mixed hyperlipidemia: Secondary | ICD-10-CM | POA: Diagnosis not present

## 2021-05-18 DIAGNOSIS — N183 Chronic kidney disease, stage 3 unspecified: Secondary | ICD-10-CM | POA: Diagnosis not present

## 2021-05-18 DIAGNOSIS — M1612 Unilateral primary osteoarthritis, left hip: Secondary | ICD-10-CM | POA: Diagnosis not present

## 2021-05-18 DIAGNOSIS — E1140 Type 2 diabetes mellitus with diabetic neuropathy, unspecified: Secondary | ICD-10-CM | POA: Diagnosis not present

## 2021-05-18 DIAGNOSIS — F418 Other specified anxiety disorders: Secondary | ICD-10-CM | POA: Diagnosis not present

## 2021-05-27 ENCOUNTER — Ambulatory Visit (INDEPENDENT_AMBULATORY_CARE_PROVIDER_SITE_OTHER): Payer: Worker's Compensation | Admitting: Physical Therapy

## 2021-05-27 ENCOUNTER — Other Ambulatory Visit: Payer: Self-pay

## 2021-05-27 ENCOUNTER — Encounter: Payer: Self-pay | Admitting: Physical Therapy

## 2021-05-27 DIAGNOSIS — G8929 Other chronic pain: Secondary | ICD-10-CM | POA: Diagnosis not present

## 2021-05-27 DIAGNOSIS — M6281 Muscle weakness (generalized): Secondary | ICD-10-CM | POA: Diagnosis not present

## 2021-05-27 DIAGNOSIS — E782 Mixed hyperlipidemia: Secondary | ICD-10-CM | POA: Diagnosis not present

## 2021-05-27 DIAGNOSIS — M545 Low back pain, unspecified: Secondary | ICD-10-CM | POA: Diagnosis not present

## 2021-05-27 DIAGNOSIS — M1612 Unilateral primary osteoarthritis, left hip: Secondary | ICD-10-CM | POA: Diagnosis not present

## 2021-05-27 DIAGNOSIS — R29898 Other symptoms and signs involving the musculoskeletal system: Secondary | ICD-10-CM | POA: Diagnosis not present

## 2021-05-27 DIAGNOSIS — I4891 Unspecified atrial fibrillation: Secondary | ICD-10-CM | POA: Diagnosis not present

## 2021-05-27 DIAGNOSIS — N183 Chronic kidney disease, stage 3 unspecified: Secondary | ICD-10-CM | POA: Diagnosis not present

## 2021-05-27 DIAGNOSIS — E1140 Type 2 diabetes mellitus with diabetic neuropathy, unspecified: Secondary | ICD-10-CM | POA: Diagnosis not present

## 2021-05-27 DIAGNOSIS — E1142 Type 2 diabetes mellitus with diabetic polyneuropathy: Secondary | ICD-10-CM | POA: Diagnosis not present

## 2021-05-27 NOTE — Patient Instructions (Signed)
Access Code: [email or web address] URL: [email or web address] Date: 05/27/2021 Prepared by: Reggy Eye  Exercises Seated Hamstring Stretch - 1 x daily - 7 x weekly - 3 sets - 1 reps - 20-30 seconds hold Supine ITB Stretch with Strap - 1 x daily - 7 x weekly - 3 sets - 1 reps - 20-30 seconds hold Seated Thoracic Flexion and Rotation with Swiss Ball - 1 x daily - 7 x weekly - 1 sets - 10 reps  Aquatic Therapy: What to Expect!  Where:  [facility] [address] [place], [place] [address] [phone]           How to Prepare:  If you require assistance with dressing, with transportation (ie: wheel chair), or toileting, a caregiver must attend the entire session with you (unless your primary therapists feels this is not necessary).   If there is thunder during your appointment, you will be asked to leave pool area. You have the option to finish your session in the physical therapy area near the gym. Masks in the pool area are optional. Your face will remain dry during your session, so you are welcome to keep your mask on, if desired. You will be spaced at least 6 feet from other aquatic patients.  Please bring your own swim towel to dry off with.   There are Men's and Women's locker rooms with showers, as well as gender neutral bathrooms in the pool area.  Please arrive IN YOUR SUIT and a few minutes prior to your appointment - this helps to avoid delays in starting your session. Head to the pool and await your appointment on the bench on the pool deck. Please make sure to attend to any toileting needs prior to entering the pool. Once on the pool deck your therapist will ask you to sign the Patient  Consent and Assignment of Benefits form. Your therapist may take your blood pressure prior to, during and after your session if indicated. We usually try and create a home exercise program based on activities we do in the pool. Some patients do not want  to or do not have the ability to participate in an aquatic home program - this is not a barrier in any way to you participating in aquatic therapy as part of your current therapy plan!  Appointments:  All sessions are 45 minutes  About the pool: Entering the pool: Your therapist will assist you if needed; there are two ways to enter the pool - stairs or a mechanical lift. Your therapist will determine the most appropriate way for you. Water temperature is usually around 91-95.  There is a lap pool with a temperature around 84 There may be other therapists and patients in the pool at the same time.   Contact Info:            To cancel appointment, please call [facility] at [phone] If you are running late, please call [facility] at [phone]

## 2021-05-27 NOTE — Therapy (Signed)
[facility] [place] [address] [place], [place], [address] Phone: [phone]   Fax:  [phone]  Physical Therapy Evaluation  Patient Details  Name: Jason Huang MRN: [id number] Date of Birth: Nov 02, 1950 Referring Provider (PT): Henrine Screws   Encounter Date: 05/27/2021   PT End of Session - 05/27/21 1239     Visit Number 1    Number of Visits 12    Date for PT Re-Evaluation 07/08/21    PT Start Time 1100    PT Stop Time 1145    PT Time Calculation (min) 45 min    Activity Tolerance Patient tolerated treatment well    Behavior During Therapy [facility] for tasks assessed/performed             Past Medical History:  Diagnosis Date   Anxiety    Arthritis    back, wrists elbows knees   Chronic kidney disease    stage 3   Complication of anesthesia    narrow airway   Depression    Diabetes mellitus without complication (HCC)    Dysrhythmia    Afib   Family history of adverse reaction to anesthesia    slow to wake up   Hypertension    Neuromuscular disorder (HCC)    neuropathy in feet   Pneumonia    Sleep apnea    CPAP    Past Surgical History:  Procedure Laterality Date   BACK SURGERY     HARDWARE REMOVAL Left 10/08/2020   Procedure: HARDWARE REMOVAL;  Surgeon: Ollen Gross, MD;  Location: [facility];  Service: Orthopedics;  Laterality: Left;   HIP PINNING Bilateral [id number]   JOINT REPLACEMENT Right 2010   KNEE ARTHROSCOPY Right 2009   NASAL SEPTOPLASTY W/ TURBINOPLASTY  1997   SHOULDER ARTHROSCOPY Right 2008   TONSILLECTOMY     as a child   TOTAL HIP ARTHROPLASTY Left 10/08/2020   Procedure: TOTAL HIP ARTHROPLASTY ANTERIOR APPROACH;  Surgeon: Ollen Gross, MD;  Location: [facility];  Service: Orthopedics;  Laterality: Left;     There were no vitals filed for this visit.    Subjective Assessment - 05/27/21 1055     Subjective In 08/2004 pt was living in [place] and working on icy roads and had a  slip and fall injury which resulted in Rt shoulder and elbow injury, Rt knee replacement, L5-S1 surgery and then in 2018 had another back surgery which decreased radicular pain but pt still has pain in low back. He feels like his muscles are cramping up in low back. Still has to use adaptive equipment for donning socks and shoes. Pain increases with bending, eases with rest    Pertinent History diabetic neuropathy bilat feet, Rt TKA, Lt total hip replacement 09/2020    Limitations Walking;Standing;House hold activities    How long can you stand comfortably? 30 minutes    How long can you walk comfortably? 15 minutes    Patient Stated Goals decrease pain    Currently in Pain? Yes    Pain Score 6     Pain Location Back    Pain Orientation Lower    Pain Descriptors / Indicators Cramping    Pain Type Chronic pain    Pain Onset More than a month ago    Pain Frequency Constant    Aggravating Factors  bend, stand, walk    Pain Relieving Factors meds, rest                [facility]  PT Assessment - 05/27/21 0001       Assessment   Medical Diagnosis lumbar intervertebral DDD    Referring Provider (PT) Aura Dials    Onset Date/Surgical Date 08/20/04    Hand Dominance Right    Prior Therapy multiple times for back and hip surgeries      Precautions   Precautions None    Precaution Comments LT hip replacement, Rt hip pins, fusion L2-S1, Rt TKA      Restrictions   Weight Bearing Restrictions No      Balance Screen   Has the patient fallen in the past 6 months No      Prior Function   Level of Independence Independent      Observation/Other Assessments   Focus on Therapeutic Outcomes (FOTO)  not assessed      ROM / Strength   AROM / PROM / Strength AROM;Strength      AROM   AROM Assessment Site Lumbar    Lumbar Flexion 25% limited    Lumbar Extension to neutral    Lumbar - Right Side Bend limited 50%    Lumbar - Left Side Bend limited 50%    Lumbar - Right Rotation limited  50%    Lumbar - Left Rotation limited 50%      Strength   Strength Assessment Site Hip    Right/Left Hip Right;Left    Right Hip Flexion 4/5   pain   Right Hip ABduction 4/5    Left Hip Flexion 4/5   pain   Left Hip ABduction 4/5      Flexibility   Soft Tissue Assessment /Muscle Length yes    Hamstrings decreased bilat    Quadriceps decreased bilat    ITB decreased bilat    Piriformis decreased bilat      Palpation   Spinal mobility fusion L2-S1    Palpation comment increased mm spasticity bilat lumbar paraspinals      Ambulation/Gait   Assistive device Straight cane    Gait Comments wide BOS, decreased cadence                        Objective measurements completed on examination: See above findings.       [facility] Adult PT Treatment/Exercise - 05/27/21 0001       Exercises   Exercises Lumbar      Lumbar Exercises: Stretches   Passive Hamstring Stretch Right;Left;30 seconds    Passive Hamstring Stretch Limitations seated    ITB Stretch Right;Left;30 seconds    ITB Stretch Limitations supine with strap    Piriformis Stretch Right;Left;30 seconds    Piriformis Stretch Limitations seated with strap    Other Lumbar Stretch Exercise lumbar flexion and side bending with UEs on physioball 30 sec forward and each side      Manual Therapy   Manual Therapy Soft tissue mobilization    Soft tissue mobilization STM bilat lumbar paraspinals to reduce mm spasticity                     PT Education - 05/27/21 1238     Education Details PT POC and goals, HEP, aquatics    Person(s) Educated Patient    Methods Explanation;Demonstration;Handout    Comprehension Returned demonstration;Verbalized understanding                 PT Long Term Goals - 05/27/21 1244       PT LONG TERM GOAL #1  Title Pt will be independent with HEP    Time 6    Period Weeks    Status New    Target Date 07/08/21      PT LONG TERM GOAL #2   Title Pt will  tolerate standing x 30 minutes with pain <= 2/10    Time 6    Period Weeks    Status New    Target Date 07/08/21      PT LONG TERM GOAL #3   Title Pt will tolerate walking x 30 minutes with LRAD with pain <= 2/10    Time 6    Period Weeks    Status New    Target Date 07/08/21                    Plan - 05/27/21 1241     Clinical Impression Statement Pt is a 70 y/o male referred s/p injury at work in 2006 for continued lumbar disc degeneration. Pt presents with impaired ROM and strength, decreased activity tolerance and decreased flexibility. Pt will benefit from skilled PT to address deficits and improve functional mobility.    Personal Factors and Comorbidities Past/Current Experience;Time since onset of injury/illness/exacerbation;Comorbidity 3+    Examination-Activity Limitations Locomotion Level;Stand;Lift;Squat;Stairs;Dressing    Examination-Participation Restrictions Yard Work;Community Activity    Stability/Clinical Decision Making Evolving/Moderate complexity    Clinical Decision Making Moderate    Rehab Potential Good    PT Frequency 2x / week    PT Duration 6 weeks    PT Treatment/Interventions Aquatic Therapy;Cryotherapy;Electrical Stimulation;Iontophoresis 4mg /ml Dexamethasone;Moist Heat;Therapeutic exercise;Balance training;Neuromuscular re-education;Therapeutic activities;Patient/family education;Manual techniques;Taping;Dry needling    PT Next Visit Plan assess HEP, progress flexibility    PT Home Exercise Plan [id number]    Consulted and Agree with Plan of Care Patient             Patient will benefit from skilled therapeutic intervention in order to improve the following deficits and impairments:  Pain, Postural dysfunction, Decreased strength, Impaired flexibility, Decreased activity tolerance, Decreased range of motion, Increased muscle spasms, Difficulty walking  Visit Diagnosis: Muscle weakness (generalized) - Plan: PT plan of care  cert/re-cert  Chronic bilateral low back pain without sciatica - Plan: PT plan of care cert/re-cert  Other symptoms and signs involving the musculoskeletal system - Plan: PT plan of care cert/re-cert     Problem List Patient Active Problem List   Diagnosis Date Noted   S/P total left hip arthroplasty 10/09/2020   OA (osteoarthritis) of hip 10/08/2020   Primary osteoarthritis of left hip 10/08/2020    Treyven Lafauci, PT 05/27/2021, 12:48 PM  [facility] [place] [address] [place], [place], [address] Phone: [phone]   Fax:  [phone]  Name: Jason Huang MRN: [id number] Date of Birth: March 30, 1951

## 2021-05-29 ENCOUNTER — Other Ambulatory Visit: Payer: Self-pay

## 2021-05-29 ENCOUNTER — Encounter: Payer: Self-pay | Admitting: Physical Therapy

## 2021-05-29 ENCOUNTER — Ambulatory Visit (INDEPENDENT_AMBULATORY_CARE_PROVIDER_SITE_OTHER): Payer: Worker's Compensation | Admitting: Physical Therapy

## 2021-05-29 DIAGNOSIS — M6281 Muscle weakness (generalized): Secondary | ICD-10-CM | POA: Diagnosis not present

## 2021-05-29 DIAGNOSIS — G8929 Other chronic pain: Secondary | ICD-10-CM

## 2021-05-29 DIAGNOSIS — M545 Low back pain, unspecified: Secondary | ICD-10-CM

## 2021-05-29 DIAGNOSIS — R29898 Other symptoms and signs involving the musculoskeletal system: Secondary | ICD-10-CM

## 2021-05-29 NOTE — Therapy (Signed)
[facility] [place] [address] [place], [place], [address] Phone: [phone]   Fax:  [phone]  Physical Therapy Treatment  Patient Details  Name: Jason Huang MRN: [id number] Date of Birth: August 26, 1950 Referring Provider (PT): Henrine Screws   Encounter Date: 05/29/2021   PT End of Session - 05/29/21 0941     Visit Number 2    Number of Visits 12    Date for PT Re-Evaluation 07/08/21    PT Start Time 0938    PT Stop Time 1017    PT Time Calculation (min) 39 min    Activity Tolerance Patient tolerated treatment well    Behavior During Therapy [facility] for tasks assessed/performed             Past Medical History:  Diagnosis Date   Anxiety    Arthritis    back, wrists elbows knees   Chronic kidney disease    stage 3   Complication of anesthesia    narrow airway   Depression    Diabetes mellitus without complication (HCC)    Dysrhythmia    Afib   Family history of adverse reaction to anesthesia    slow to wake up   Hypertension    Neuromuscular disorder (HCC)    neuropathy in feet   Pneumonia    Sleep apnea    CPAP    Past Surgical History:  Procedure Laterality Date   BACK SURGERY     HARDWARE REMOVAL Left 10/08/2020   Procedure: HARDWARE REMOVAL;  Surgeon: Ollen Gross, MD;  [facility];  Service: Orthopedics;  Laterality: Left;   HIP PINNING Bilateral [id number]   JOINT REPLACEMENT Right 2010   KNEE ARTHROSCOPY Right 2009   NASAL SEPTOPLASTY W/ TURBINOPLASTY  1997   SHOULDER ARTHROSCOPY Right 2008   TONSILLECTOMY     as a child   TOTAL HIP ARTHROPLASTY Left 10/08/2020   Procedure: TOTAL HIP ARTHROPLASTY ANTERIOR APPROACH;  Surgeon: Ollen Gross, MD;  Location: [facility];  Service: Orthopedics;  Laterality: Left;     There were no vitals filed for this visit.   Subjective Assessment - 05/29/21 0942     Subjective Pt reports he wasn't able to do any exercises yesterday because he  was at car [employer] for 8 hrs. "There was a lot of running around yesterday".    Pertinent History diabetic neuropathy bilat feet, Rt TKA, Lt total hip replacement 09/2020    Currently in Pain? Yes    Pain Score 8     Pain Location Back    Pain Orientation Lower    Pain Descriptors / Indicators Sore;Aching    Aggravating Factors  standing, walking    Pain Relieving Factors meds, rest                [facility] PT Assessment - 05/29/21 0001       Assessment   Medical Diagnosis lumbar intervertebral DDD    Referring Provider (PT) Henrine Screws    Onset Date/Surgical Date 08/20/04    Hand Dominance Right    Prior Therapy multiple times for back and hip surgeries               [facility] Adult PT Treatment/Exercise - 05/29/21 0001       Lumbar Exercises: Stretches   Passive Hamstring Stretch Right;Left;2 reps;20 seconds    Passive Hamstring Stretch Limitations seated    Hip Flexor Stretch Left;3 reps;Right;2 reps;20 seconds   seated hip flexor stretch with leg  off side of chair.   Piriformis Stretch Right;Left;3 reps;20 seconds    Piriformis Stretch Limitations seated with strap and therapist assist (foot not quite to thigh)    Other Lumbar Stretch Exercise lumbar flexion and side bending with UEs on physioball 5-10 sec forward and each side, multiple reps.      Lumbar Exercises: Aerobic   Nustep L5: arms/legs x 5 min for warm up      Lumbar Exercises: Seated   Sit to Stand 5 reps   with core engaged   Other Seated Lumbar Exercises abdominal set x 5 sec x 5 reps - arms pressing on cane               PT Long Term Goals - 05/27/21 1244       PT LONG TERM GOAL #1   Title Pt will be independent with HEP    Time 6    Period Weeks    Status New    Target Date 07/08/21      PT LONG TERM GOAL #2   Title Pt will tolerate standing x 30 minutes with pain <= 2/10    Time 6    Period Weeks    Status New    Target Date 07/08/21      PT LONG TERM GOAL #3   Title Pt  will tolerate walking x 30 minutes with LRAD with pain <= 2/10    Time 6    Period Weeks    Status New    Target Date 07/08/21                   Plan - 05/29/21 1223     Clinical Impression Statement Pt tolerated exercises well.  Reviewed HEP and added TA set and seated quad stretch with good tolerance.  Pt required some assistance for piriformis stretch. Goals are ongoing.    Personal Factors and Comorbidities Past/Current Experience;Time since onset of injury/illness/exacerbation;Comorbidity 3+    Examination-Activity Limitations Locomotion Level;Stand;Lift;Squat;Stairs;Dressing    Examination-Participation Restrictions Yard Work;Community Activity    Stability/Clinical Decision Making Evolving/Moderate complexity    Rehab Potential Good    PT Frequency 2x / week    PT Duration 6 weeks    PT Treatment/Interventions Aquatic Therapy;Cryotherapy;Electrical Stimulation;Iontophoresis 4mg /ml Dexamethasone;Moist Heat;Therapeutic exercise;Balance training;Neuromuscular re-education;Therapeutic activities;Patient/family education;Manual techniques;Taping;Dry needling    PT Next Visit Plan progress flexibility and HEP as tolerated.    PT Home Exercise Plan [id number]    Consulted and Agree with Plan of Care Patient             Patient will benefit from skilled therapeutic intervention in order to improve the following deficits and impairments:  Pain, Postural dysfunction, Decreased strength, Impaired flexibility, Decreased activity tolerance, Decreased range of motion, Increased muscle spasms, Difficulty walking  Visit Diagnosis: Muscle weakness (generalized)  Chronic bilateral low back pain without sciatica  Other symptoms and signs involving the musculoskeletal system     Problem List Patient Active Problem List   Diagnosis Date Noted   S/P total left hip arthroplasty 10/09/2020   OA (osteoarthritis) of hip 10/08/2020   Primary osteoarthritis of left hip 10/08/2020    Kerin Perna, PTA 05/29/21 12:27 PM  [place], [place], [address] Phone: [phone]   Fax:  [phone]  Name: Jason Huang MRN: [id number] Date of Birth: Dec 03, 1950

## 2021-05-30 ENCOUNTER — Other Ambulatory Visit: Payer: Self-pay | Admitting: Cardiology

## 2021-05-30 DIAGNOSIS — I471 Supraventricular tachycardia: Secondary | ICD-10-CM

## 2021-05-30 DIAGNOSIS — I4719 Other supraventricular tachycardia: Secondary | ICD-10-CM

## 2021-06-02 ENCOUNTER — Other Ambulatory Visit: Payer: Self-pay

## 2021-06-02 ENCOUNTER — Ambulatory Visit (INDEPENDENT_AMBULATORY_CARE_PROVIDER_SITE_OTHER): Payer: Worker's Compensation | Admitting: Physical Therapy

## 2021-06-02 DIAGNOSIS — G8929 Other chronic pain: Secondary | ICD-10-CM

## 2021-06-02 DIAGNOSIS — M545 Low back pain, unspecified: Secondary | ICD-10-CM | POA: Diagnosis not present

## 2021-06-02 DIAGNOSIS — R29898 Other symptoms and signs involving the musculoskeletal system: Secondary | ICD-10-CM | POA: Diagnosis not present

## 2021-06-02 DIAGNOSIS — M6281 Muscle weakness (generalized): Secondary | ICD-10-CM

## 2021-06-02 NOTE — Therapy (Signed)
[place], [place], [address] Phone: [phone]   Fax:  [phone]  Physical Therapy Treatment  Patient Details  Name: Jason Huang MRN: [id number] Date of Birth: 03/05/1951 Referring Provider (PT): Aura Dials   Encounter Date: 06/02/2021   PT End of Session - 06/02/21 1108     Visit Number 3    Number of Visits 12    Date for PT Re-Evaluation 07/08/21    PT Start Time 1025    PT Stop Time 1108    PT Time Calculation (min) 43 min    Activity Tolerance Patient tolerated treatment well    Behavior During Therapy [facility] for tasks assessed/performed             Past Medical History:  Diagnosis Date   Anxiety    Arthritis    back, wrists elbows knees   Chronic kidney disease    stage 3   Complication of anesthesia    narrow airway   Depression    Diabetes mellitus without complication ([place])    Dysrhythmia    Afib   Family history of adverse reaction to anesthesia    slow to wake up   Hypertension    Neuromuscular disorder ([place])    neuropathy in feet   Pneumonia    Sleep apnea    CPAP    Past Surgical History:  Procedure Laterality Date   BACK SURGERY     HARDWARE REMOVAL Left 10/08/2020   Procedure: HARDWARE REMOVAL;  Surgeon: Gaynelle Arabian, MD;  [facility]: [facility];  Service: Orthopedics;  Laterality: Left;   HIP PINNING Bilateral [id number]   JOINT REPLACEMENT Right 2010   KNEE ARTHROSCOPY Right 2009   NASAL SEPTOPLASTY W/ TURBINOPLASTY  1997   SHOULDER ARTHROSCOPY Right 2008   TONSILLECTOMY     as a child   TOTAL HIP ARTHROPLASTY Left 10/08/2020   Procedure: TOTAL HIP ARTHROPLASTY ANTERIOR APPROACH;  Surgeon: Gaynelle Arabian, MD;  [facility];  Service: Orthopedics;  Laterality: Left;  110min    There were no vitals filed for this visit.   Subjective Assessment - 06/02/21 1029     Subjective Pt states he did his stretches yesterday and they were "pretty good"     Patient Stated Goals decrease pain    Currently in Pain? Yes    Pain Score 7     Pain Location Back    Pain Orientation Lower                               [facility] Adult PT Treatment/Exercise - 06/02/21 0001       Lumbar Exercises: Stretches   Passive Hamstring Stretch Right;Left;2 reps;20 seconds    Passive Hamstring Stretch Limitations seated    Hip Flexor Stretch Left;3 reps;Right;2 reps;20 seconds   seated hip flexor stretch with leg off side of chair.   Piriformis Stretch Right;Left;3 reps;20 seconds    Piriformis Stretch Limitations seated with strap and therapist assist (foot not quite to thigh)    Other Lumbar Stretch Exercise lumbar flexion and side bending with UEs on physioball 5-10 sec forward and each side, multiple reps.      Lumbar Exercises: Aerobic   Nustep L5: arms/legs x 5 min for warm up      Manual Therapy   Soft tissue mobilization STM bilat lumbar paraspinals to reduce mm spasticity  PT Long Term Goals - 05/27/21 1244       PT LONG TERM GOAL #1   Title Pt will be independent with HEP    Time 6    Period Weeks    Status New    Target Date 07/08/21      PT LONG TERM GOAL #2   Title Pt will tolerate standing x 30 minutes with pain <= 2/10    Time 6    Period Weeks    Status New    Target Date 07/08/21      PT LONG TERM GOAL #3   Title Pt will tolerate walking x 30 minutes with LRAD with pain <= 2/10    Time 6    Period Weeks    Status New    Target Date 07/08/21                   Plan - 06/02/21 1109     Clinical Impression Statement Pt with good response to manual therapy. Encouraged TA set during rolling and supine to sit. Pt given info on purchase of a physioball to assist with exercises at home    PT Next Visit Plan progress flexibility and HEP as tolerated.    PT Home Exercise Plan [id number]    Consulted and Agree with Plan of Care Patient              Patient will benefit from skilled therapeutic intervention in order to improve the following deficits and impairments:     Visit Diagnosis: Muscle weakness (generalized)  Chronic bilateral low back pain without sciatica  Other symptoms and signs involving the musculoskeletal system     Problem List Patient Active Problem List   Diagnosis Date Noted   S/P total left hip arthroplasty 10/09/2020   OA (osteoarthritis) of hip 10/08/2020   Primary osteoarthritis of left hip 10/08/2020    Kariyah Baugh, PT 06/02/2021, 11:11 AM  [facility] [address] [place], [place], [address] Phone: [phone]   Fax:  [phone]  Name: Vishwa Dais MRN: [id number] Date of Birth: 23-Sep-1950

## 2021-06-04 ENCOUNTER — Telehealth: Payer: Self-pay

## 2021-06-04 NOTE — Telephone Encounter (Signed)
Sure. We can. Does he want to f/u with [place] or PCP. If [place] if there is not a large copay, we can have [facility] dose it. Otherwise PCP office.Let him know he will have to come at least once a month to have it checked and that in the begennng, may have to come often to find right dose

## 2021-06-04 NOTE — Telephone Encounter (Signed)
Called and spoke to pt, pt wants our office to handle the medication and checks. Call transferred to [facility].

## 2021-06-04 NOTE — Telephone Encounter (Signed)
Pt called and stated that he is unable to afford Xarelto. He needs a refill but would like to know if we can do warfarin instead because he thinks it may be cheaper. I offered to have him apply for patient assistance but he seemed hesitant. Please advise.

## 2021-06-04 NOTE — Telephone Encounter (Signed)
Pt to come into the office tomorrow to complete [facility] patient assistance application. Prefers to try it first before considering starting warfarin. Will provide pt with some samples in the meantime.

## 2021-06-05 ENCOUNTER — Ambulatory Visit (INDEPENDENT_AMBULATORY_CARE_PROVIDER_SITE_OTHER): Payer: Worker's Compensation | Admitting: Physical Therapy

## 2021-06-05 ENCOUNTER — Other Ambulatory Visit: Payer: Self-pay

## 2021-06-05 DIAGNOSIS — G8929 Other chronic pain: Secondary | ICD-10-CM

## 2021-06-05 DIAGNOSIS — M545 Low back pain, unspecified: Secondary | ICD-10-CM | POA: Diagnosis not present

## 2021-06-05 DIAGNOSIS — M6281 Muscle weakness (generalized): Secondary | ICD-10-CM

## 2021-06-05 DIAGNOSIS — R29898 Other symptoms and signs involving the musculoskeletal system: Secondary | ICD-10-CM

## 2021-06-05 NOTE — Therapy (Signed)
[facility] [place] [address] [place] [address] [place], [place], [address] Phone: [phone]   Fax:  [phone]  Physical Therapy Treatment  Patient Details  Name: Jason Huang MRN: [id number] Date of Birth: 06/02/1951 Referring Provider (PT): Henrine Screws   Encounter Date: 06/05/2021   PT End of Session - 06/05/21 1139     Visit Number 4    Number of Visits 12    Date for PT Re-Evaluation 07/08/21    PT Start Time 1055    PT Stop Time 1140    PT Time Calculation (min) 45 min    Activity Tolerance Patient tolerated treatment well    Behavior During Therapy [facility] for tasks assessed/performed             Past Medical History:  Diagnosis Date   Anxiety    Arthritis    back, wrists elbows knees   Chronic kidney disease    stage 3   Complication of anesthesia    narrow airway   Depression    Diabetes mellitus without complication (HCC)    Dysrhythmia    Afib   Family history of adverse reaction to anesthesia    slow to wake up   Hypertension    Neuromuscular disorder (HCC)    neuropathy in feet   Pneumonia    Sleep apnea    CPAP    Past Surgical History:  Procedure Laterality Date   BACK SURGERY     HARDWARE REMOVAL Left 10/08/2020   Procedure: HARDWARE REMOVAL;  Surgeon: Ollen Gross, MD;  Location: [facility];  Service: Orthopedics;  Laterality: Left;   HIP PINNING Bilateral [id number]   JOINT REPLACEMENT Right 2010   KNEE ARTHROSCOPY Right 2009   NASAL SEPTOPLASTY W/ TURBINOPLASTY  1997   SHOULDER ARTHROSCOPY Right 2008   TONSILLECTOMY     as a child   TOTAL HIP ARTHROPLASTY Left 10/08/2020   Procedure: TOTAL HIP ARTHROPLASTY ANTERIOR APPROACH;  Surgeon: Ollen Gross, MD;  Location: [facility];  Service: Orthopedics;  Laterality: Left;     There were no vitals filed for this visit.   Subjective Assessment - 06/05/21 1055     Subjective Pt states his back was "roaring" yesterday but today is better     Patient Stated Goals decrease pain    Currently in Pain? Yes    Pain Score 6     Pain Location Back    Pain Orientation Lower    Pain Descriptors / Indicators Aching;Sore    Pain Type Chronic pain                [facility] PT Assessment - 06/05/21 0001       Assessment   Medical Diagnosis lumbar intervertebral DDD    Referring Provider (PT) Henrine Screws    Onset Date/Surgical Date 08/20/04    Hand Dominance Right    Prior Therapy multiple times for back and hip surgeries      Flexibility   Hamstrings decreased bilat    Piriformis decreased bilat                           [facility] Adult PT Treatment/Exercise - 06/05/21 0001       Lumbar Exercises: Stretches   Passive Hamstring Stretch Right;Left;2 reps;20 seconds    Passive Hamstring Stretch Limitations seated    Piriformis Stretch Right;Left;3 reps;20 seconds    Piriformis Stretch Limitations seated with strap and therapist assist (foot not  quite to thigh)    Other Lumbar Stretch Exercise lumbar flexion and side bending with UEs on physioball 5-10 sec forward and each side, multiple reps.      Lumbar Exercises: Aerobic   Nustep L5: arms/legs x 5 min for warm up      Lumbar Exercises: Seated   Sit to Stand 10 reps    Sit to Stand Limitations 2 x 5 with with blue med ball for core engagement    Other Seated Lumbar Exercises seated pallof press blue TB x 10 bilat      Manual Therapy   Manual therapy comments skilled palpation to assess effects of dry needling    Soft tissue mobilization STM bilat lumbar paraspinals to reduce mm spasticity              Trigger Point Dry Needling - 06/05/21 0001     Consent Given? Yes    Education Handout Provided Yes    Muscles Treated Back/Hip Erector spinae;Lumbar multifidi    Erector spinae Response Palpable increased muscle length    Lumbar multifidi Response Palpable increased muscle length                   PT Education - 06/05/21 1139      Education Details dry needling    Person(s) Educated Patient    Methods Explanation;Handout    Comprehension Verbalized understanding                 PT Long Term Goals - 05/27/21 1244       PT LONG TERM GOAL #1   Title Pt will be independent with HEP    Time 6    Period Weeks    Status New    Target Date 07/08/21      PT LONG TERM GOAL #2   Title Pt will tolerate standing x 30 minutes with pain <= 2/10    Time 6    Period Weeks    Status New    Target Date 07/08/21      PT LONG TERM GOAL #3   Title Pt will tolerate walking x 30 minutes with LRAD with pain <= 2/10    Time 6    Period Weeks    Status New    Target Date 07/08/21                   Plan - 06/05/21 1140     Clinical Impression Statement Trial of dry needling today with pt with good response. Good tolerance to core strengthening exercises    PT Next Visit Plan progress flexibility and HEP as tolerated, manual as indicated    PT Home Exercise Plan [id number]    Consulted and Agree with Plan of Care Patient             Patient will benefit from skilled therapeutic intervention in order to improve the following deficits and impairments:     Visit Diagnosis: Muscle weakness (generalized)  Chronic bilateral low back pain without sciatica  Other symptoms and signs involving the musculoskeletal system     Problem List Patient Active Problem List   Diagnosis Date Noted   S/P total left hip arthroplasty 10/09/2020   OA (osteoarthritis) of hip 10/08/2020   Primary osteoarthritis of left hip 10/08/2020    Erby Sanderson, PT 06/05/2021, 11:43 AM  [facility] [place], [place], [address] Phone: [phone]   Fax:  [phone]  Name:  Jason Huang MRN: [id number] Date of Birth: 11-23-1950

## 2021-06-09 ENCOUNTER — Ambulatory Visit (INDEPENDENT_AMBULATORY_CARE_PROVIDER_SITE_OTHER): Payer: Worker's Compensation | Admitting: Physical Therapy

## 2021-06-09 ENCOUNTER — Other Ambulatory Visit: Payer: Self-pay

## 2021-06-09 DIAGNOSIS — R29898 Other symptoms and signs involving the musculoskeletal system: Secondary | ICD-10-CM | POA: Diagnosis not present

## 2021-06-09 DIAGNOSIS — G8929 Other chronic pain: Secondary | ICD-10-CM

## 2021-06-09 DIAGNOSIS — M545 Low back pain, unspecified: Secondary | ICD-10-CM | POA: Diagnosis not present

## 2021-06-09 DIAGNOSIS — M6281 Muscle weakness (generalized): Secondary | ICD-10-CM | POA: Diagnosis not present

## 2021-06-09 NOTE — Therapy (Signed)
[facility] [place] [address] [place] [address] [place], [place], [address] Phone: [phone]   Fax:  [phone]  Physical Therapy Treatment  Patient Details  Name: Jason Huang MRN: [id number] Date of Birth: 10-06-1950 Referring Provider (PT): Henrine Screws   Encounter Date: 06/09/2021   PT End of Session - 06/09/21 1143     Visit Number 5    Number of Visits 12    Date for PT Re-Evaluation 07/08/21    PT Start Time 1104    PT Stop Time 1145    PT Time Calculation (min) 41 min    Activity Tolerance Patient tolerated treatment well    Behavior During Therapy [facility] for tasks assessed/performed             Past Medical History:  Diagnosis Date   Anxiety    Arthritis    back, wrists elbows knees   Chronic kidney disease    stage 3   Complication of anesthesia    narrow airway   Depression    Diabetes mellitus without complication (HCC)    Dysrhythmia    Afib   Family history of adverse reaction to anesthesia    slow to wake up   Hypertension    Neuromuscular disorder (HCC)    neuropathy in feet   Pneumonia    Sleep apnea    CPAP    Past Surgical History:  Procedure Laterality Date   BACK SURGERY     HARDWARE REMOVAL Left 10/08/2020   Procedure: HARDWARE REMOVAL;  Surgeon: Ollen Gross, MD;  [facility];  Service: Orthopedics;  Laterality: Left;   HIP PINNING Bilateral [id number]   JOINT REPLACEMENT Right 2010   KNEE ARTHROSCOPY Right 2009   NASAL SEPTOPLASTY W/ TURBINOPLASTY  1997   SHOULDER ARTHROSCOPY Right 2008   TONSILLECTOMY     as a child   TOTAL HIP ARTHROPLASTY Left 10/08/2020   Procedure: TOTAL HIP ARTHROPLASTY ANTERIOR APPROACH;  Surgeon: Ollen Gross, MD;  [facility]: [facility];  Service: Orthopedics;  Laterality: Left;     There were no vitals filed for this visit.   Subjective Assessment - 06/09/21 1105     Subjective Pt states "I'm sore, but I'm always sore"    Patient Stated Goals  decrease pain    Currently in Pain? Yes    Pain Score 7     Pain Location Back    Pain Orientation Lower    Pain Descriptors / Indicators Sore    Pain Type Chronic pain                [facility] PT Assessment - 06/09/21 0001       Assessment   Medical Diagnosis lumbar intervertebral DDD    Referring Provider (PT) Henrine Screws    Onset Date/Surgical Date 08/20/04    Hand Dominance Right    Prior Therapy multiple times for back and hip surgeries      AROM   Lumbar - Right Side Bend limited 50%    Lumbar - Left Side Bend limited 50%      Flexibility   Hamstrings decreased bilat    Piriformis decreased bilat                           [facility] Adult PT Treatment/Exercise - 06/09/21 0001       Lumbar Exercises: Stretches   Passive Hamstring Stretch Right;Left;2 reps;20 seconds    Passive Hamstring Stretch Limitations seated  Hip Flexor Stretch Right;Left;2 reps;30 seconds    Other Lumbar Stretch Exercise lumbar flexion and side bending with UEs on physioball 5-10 sec forward and each side, multiple reps.      Lumbar Exercises: Seated   Hip Flexion on Ball Limitations hip flexion seated marching with 10#KB on knee to improve lower abdominal strength    Sit to Stand 10 reps    Sit to Stand Limitations x 10 with 10# KB for core engagement    Other Seated Lumbar Exercises seated pallof press blue TB x 10 bilat      Manual Therapy   Soft tissue mobilization STM and IASTM bilat lumbar paraspinals                          PT Long Term Goals - 05/27/21 1244       PT LONG TERM GOAL #1   Title Pt will be independent with HEP    Time 6    Period Weeks    Status New    Target Date 07/08/21      PT LONG TERM GOAL #2   Title Pt will tolerate standing x 30 minutes with pain <= 2/10    Time 6    Period Weeks    Status New    Target Date 07/08/21      PT LONG TERM GOAL #3   Title Pt will tolerate walking x 30 minutes with LRAD with pain  <= 2/10    Time 6    Period Weeks    Status New    Target Date 07/08/21                   Plan - 06/09/21 1158     Clinical Impression Statement Trial of IASTM today with good response from patient. Pt with limited standing tolerance but good tolerance to seated stretches and exercises    PT Next Visit Plan progress flexibility and HEP as tolerated, manual as indicated    PT Home Exercise Plan [id number]    Consulted and Agree with Plan of Care Patient             Patient will benefit from skilled therapeutic intervention in order to improve the following deficits and impairments:     Visit Diagnosis: Muscle weakness (generalized)  Chronic bilateral low back pain without sciatica  Other symptoms and signs involving the musculoskeletal system     Problem List Patient Active Problem List   Diagnosis Date Noted   S/P total left hip arthroplasty 10/09/2020   OA (osteoarthritis) of hip 10/08/2020   Primary osteoarthritis of left hip 10/08/2020    Blu Lori, PT 06/09/2021, 11:59 AM  [facility] [place], [place], [address] Phone: [phone]   Fax:  [phone]  Name: Jason Huang MRN: [id number] Date of Birth: 12/07/1950

## 2021-06-16 ENCOUNTER — Other Ambulatory Visit: Payer: Self-pay

## 2021-06-16 ENCOUNTER — Ambulatory Visit (INDEPENDENT_AMBULATORY_CARE_PROVIDER_SITE_OTHER): Payer: Worker's Compensation | Admitting: Physical Therapy

## 2021-06-16 DIAGNOSIS — G8929 Other chronic pain: Secondary | ICD-10-CM | POA: Diagnosis not present

## 2021-06-16 DIAGNOSIS — M6281 Muscle weakness (generalized): Secondary | ICD-10-CM | POA: Diagnosis not present

## 2021-06-16 DIAGNOSIS — R29898 Other symptoms and signs involving the musculoskeletal system: Secondary | ICD-10-CM

## 2021-06-16 DIAGNOSIS — M545 Low back pain, unspecified: Secondary | ICD-10-CM | POA: Diagnosis not present

## 2021-06-16 NOTE — Therapy (Signed)
[facility] [place] [address] [place] [address] [place], [place], [address] Phone: [phone]   Fax:  [phone]  Physical Therapy Treatment  Patient Details  Name: Jason Huang MRN: [id number] Date of Birth: 25-Feb-1951 Referring Provider (PT): Henrine Screws   Encounter Date: 06/16/2021   PT End of Session - 06/16/21 1154     Visit Number 6    Number of Visits 12    Date for PT Re-Evaluation 07/08/21    PT Start Time 1104    PT Stop Time 1147    PT Time Calculation (min) 43 min    Activity Tolerance Patient tolerated treatment well    Behavior During Therapy [facility] for tasks assessed/performed             Past Medical History:  Diagnosis Date   Anxiety    Arthritis    back, wrists elbows knees   Chronic kidney disease    stage 3   Complication of anesthesia    narrow airway   Depression    Diabetes mellitus without complication (HCC)    Dysrhythmia    Afib   Family history of adverse reaction to anesthesia    slow to wake up   Hypertension    Neuromuscular disorder (HCC)    neuropathy in feet   Pneumonia    Sleep apnea    CPAP    Past Surgical History:  Procedure Laterality Date   BACK SURGERY     HARDWARE REMOVAL Left 10/08/2020   Procedure: HARDWARE REMOVAL;  Surgeon: Ollen Gross, MD;  [facility]: [facility];  Service: Orthopedics;  Laterality: Left;   HIP PINNING Bilateral [id number]   JOINT REPLACEMENT Right 2010   KNEE ARTHROSCOPY Right 2009   NASAL SEPTOPLASTY W/ TURBINOPLASTY  1997   SHOULDER ARTHROSCOPY Right 2008   TONSILLECTOMY     as a child   TOTAL HIP ARTHROPLASTY Left 10/08/2020   Procedure: TOTAL HIP ARTHROPLASTY ANTERIOR APPROACH;  Surgeon: Ollen Gross, MD;  Location: [facility];  Service: Orthopedics;  Laterality: Left;     There were no vitals filed for this visit.   Subjective Assessment - 06/16/21 1112     Subjective Pt states he started feeling better and then did a lot of yard work  so he is more sore    Currently in Pain? Yes    Pain Score 5     Pain Location Back    Pain Orientation Lower                [facility] PT Assessment - 06/16/21 0001       Assessment   Medical Diagnosis lumbar intervertebral DDD    Referring Provider (PT) Henrine Screws    Onset Date/Surgical Date 08/20/04    Hand Dominance Right    Prior Therapy multiple times for back and hip surgeries      Palpation   Palpation comment increased mm spasticity lumbar paraspinals                           [facility] Adult PT Treatment/Exercise - 06/16/21 0001       Lumbar Exercises: Stretches   Passive Hamstring Stretch Right;Left;2 reps;20 seconds    Passive Hamstring Stretch Limitations seated    Other Lumbar Stretch Exercise standing lumbar extension to tolerance 3 x 10 sec      Lumbar Exercises: Aerobic   Nustep L5 x 5 min s UE/LE for warm up  Lumbar Exercises: Seated   Hip Flexion on Ball Limitations hip flexion seated marching with 10#KB on knee to improve lower abdominal strength    Sit to Stand 10 reps    Sit to Stand Limitations x 12 with 10# KB for core engagement    Other Seated Lumbar Exercises bow and arrow x 10 bilat blue TB    Other Seated Lumbar Exercises row x 12 blue TB      Manual Therapy   Soft tissue mobilization STM and IASTM bilat lumbar paraspinals                          PT Long Term Goals - 05/27/21 1244       PT LONG TERM GOAL #1   Title Pt will be independent with HEP    Time 6    Period Weeks    Status New    Target Date 07/08/21      PT LONG TERM GOAL #2   Title Pt will tolerate standing x 30 minutes with pain <= 2/10    Time 6    Period Weeks    Status New    Target Date 07/08/21      PT LONG TERM GOAL #3   Title Pt will tolerate walking x 30 minutes with LRAD with pain <= 2/10    Time 6    Period Weeks    Status New    Target Date 07/08/21                   Plan - 06/16/21 1155      Clinical Impression Statement Pt with good tolerance to progression of seated core exercises. He continues to respond well to IASTM. Pt states home TENS is no longer helpful for pain control and asks about other modalities.    PT Next Visit Plan update HEP, manual as tolerated, tape?    PT Home Exercise Plan [id number]    Consulted and Agree with Plan of Care Patient             Patient will benefit from skilled therapeutic intervention in order to improve the following deficits and impairments:     Visit Diagnosis: Muscle weakness (generalized)  Chronic bilateral low back pain without sciatica  Other symptoms and signs involving the musculoskeletal system     Problem List Patient Active Problem List   Diagnosis Date Noted   S/P total left hip arthroplasty 10/09/2020   OA (osteoarthritis) of hip 10/08/2020   Primary osteoarthritis of left hip 10/08/2020    Jason Huang, PT 06/16/2021, 11:57 AM  [facility] [place], [place], [address] Phone: [phone]   Fax:  [phone]  Name: Jason Huang MRN: [id number] Date of Birth: 22-Mar-1951

## 2021-06-19 ENCOUNTER — Encounter: Payer: Self-pay | Admitting: Physical Therapy

## 2021-06-19 ENCOUNTER — Other Ambulatory Visit: Payer: Self-pay

## 2021-06-19 ENCOUNTER — Ambulatory Visit (INDEPENDENT_AMBULATORY_CARE_PROVIDER_SITE_OTHER): Payer: Worker's Compensation | Admitting: Physical Therapy

## 2021-06-19 DIAGNOSIS — G8929 Other chronic pain: Secondary | ICD-10-CM | POA: Diagnosis not present

## 2021-06-19 DIAGNOSIS — M6281 Muscle weakness (generalized): Secondary | ICD-10-CM | POA: Diagnosis not present

## 2021-06-19 DIAGNOSIS — M545 Low back pain, unspecified: Secondary | ICD-10-CM | POA: Diagnosis not present

## 2021-06-19 DIAGNOSIS — R29898 Other symptoms and signs involving the musculoskeletal system: Secondary | ICD-10-CM

## 2021-06-19 NOTE — Therapy (Signed)
[facility] [place] [address] [place] [address] [place], [place], [address] Phone: [phone]   Fax:  [phone]  Physical Therapy Treatment  Patient Details  Name: Romney Compean MRN: [id number] Date of Birth: 1951-06-30 Referring Provider (PT): Henrine Screws   Encounter Date: 06/19/2021   PT End of Session - 06/19/21 1154     Visit Number 7    Number of Visits 12    Date for PT Re-Evaluation 07/08/21    PT Start Time 1105    PT Stop Time 1154    PT Time Calculation (min) 49 min    Activity Tolerance Patient tolerated treatment well    Behavior During Therapy [facility] for tasks assessed/performed             Past Medical History:  Diagnosis Date   Anxiety    Arthritis    back, wrists elbows knees   Chronic kidney disease    stage 3   Complication of anesthesia    narrow airway   Depression    Diabetes mellitus without complication (HCC)    Dysrhythmia    Afib   Family history of adverse reaction to anesthesia    slow to wake up   Hypertension    Neuromuscular disorder (HCC)    neuropathy in feet   Pneumonia    Sleep apnea    CPAP    Past Surgical History:  Procedure Laterality Date   BACK SURGERY     HARDWARE REMOVAL Left 10/08/2020   Procedure: HARDWARE REMOVAL;  Surgeon: Ollen Gross, MD;  [facility];  Service: Orthopedics;  Laterality: Left;   HIP PINNING Bilateral [id number]   JOINT REPLACEMENT Right 2010   KNEE ARTHROSCOPY Right 2009   NASAL SEPTOPLASTY W/ TURBINOPLASTY  1997   SHOULDER ARTHROSCOPY Right 2008   TONSILLECTOMY     as a child   TOTAL HIP ARTHROPLASTY Left 10/08/2020   Procedure: TOTAL HIP ARTHROPLASTY ANTERIOR APPROACH;  Surgeon: Ollen Gross, MD;  [facility]: [facility];  Service: Orthopedics;  Laterality: Left;     There were no vitals filed for this visit.   Subjective Assessment - 06/19/21 1113     Subjective Pt reports he bought a physioball and has used it to stretch.  He  reports his glutes were crying when he woke up this morning.  He reports that his pain level overall has improved.    Pertinent History diabetic neuropathy bilat feet, Rt TKA, Lt total hip replacement 09/2020    Currently in Pain? Yes    Pain Score 9     Pain Location Back    Pain Orientation Right;Lower    Pain Descriptors / Indicators Sore;Aching    Aggravating Factors  standing, walking    Pain Relieving Factors rest, stretches.                [facility] PT Assessment - 06/19/21 0001       Assessment   Medical Diagnosis lumbar intervertebral DDD    Referring Provider (PT) Henrine Screws    Onset Date/Surgical Date 08/20/04    Hand Dominance Right    Prior Therapy multiple times for back and hip surgeries              [facility] Adult PT Treatment/Exercise - 06/19/21 0001       Lumbar Exercises: Stretches           Hip Flexor Stretch Right;3 reps;Left;2 reps;60 seconds   2nd rep with arm overhead   Other  Lumbar Stretch Exercise lumbar flexion and side bending with UEs on physioball 5-10 sec forward and each side, multiple reps.    Other Lumbar Stretch Exercise standing lumbar extension to tolerance 3 x 10 sec      Lumbar Exercises: Aerobic   Nustep L5 x 7 min s UE/LE for warm up      Lumbar Exercises: Seated   Sit to Stand Limitations x 10 with 10# KB for core engagement    Other Seated Lumbar Exercises bilat shoulder ext with green band x 10 x 2 sets, bilat shoulder row with blue band x 10 reps x 2.  Core engaged with lifting red pball overhead x 5 reps      Manual Therapy   Soft tissue mobilization STM and MFR bilat lumbar paraspinals /  QL               PT Long Term Goals - 05/27/21 1244       PT LONG TERM GOAL #1   Title Pt will be independent with HEP    Time 6    Period Weeks    Status New    Target Date 07/08/21      PT LONG TERM GOAL #2   Title Pt will tolerate standing x 30 minutes with pain <= 2/10    Time 6    Period Weeks    Status New     Target Date 07/08/21      PT LONG TERM GOAL #3   Title Pt will tolerate walking x 30 minutes with LRAD with pain <= 2/10    Time 6    Period Weeks    Status New    Target Date 07/08/21                   Plan - 06/19/21 1138     Clinical Impression Statement Pt tolerated exercises well without increase in low back pain;  reported reduction of tightness and pain afterwards.  Continues to benefit from MFR/STM to lumbar musculature; demonstrates improved gait (less antalgic) at end of session.  Pt making gradual progress towards LTGs.    PT Treatment/Interventions Aquatic Therapy;Cryotherapy;Electrical Stimulation;Iontophoresis 4mg /ml Dexamethasone;Moist Heat;Therapeutic exercise;Balance training;Neuromuscular re-education;Therapeutic activities;Patient/family education;Manual techniques;Taping;Dry needling    PT Next Visit Plan issue updated HEP;  manual therapy as indicated.  continue general flexibility and lumbar stabilization.    PT Home Exercise Plan [id number]    Consulted and Agree with Plan of Care Patient             Patient will benefit from skilled therapeutic intervention in order to improve the following deficits and impairments:  Pain, Postural dysfunction, Decreased strength, Impaired flexibility, Decreased activity tolerance, Decreased range of motion, Increased muscle spasms, Difficulty walking  Visit Diagnosis: Muscle weakness (generalized)  Chronic bilateral low back pain without sciatica  Other symptoms and signs involving the musculoskeletal system     Problem List Patient Active Problem List   Diagnosis Date Noted   S/P total left hip arthroplasty 10/09/2020   OA (osteoarthritis) of hip 10/08/2020   Primary osteoarthritis of left hip 10/08/2020   Kerin Perna, PTA 06/19/21 1:34 PM   [facility] [place], [place], [address] Phone: [phone]   Fax:   [phone]  Name: Janis Loach MRN: [id number] Date of Birth: 1951/02/12

## 2021-06-24 ENCOUNTER — Other Ambulatory Visit: Payer: Self-pay

## 2021-06-24 ENCOUNTER — Ambulatory Visit (INDEPENDENT_AMBULATORY_CARE_PROVIDER_SITE_OTHER): Payer: Worker's Compensation | Admitting: Physical Therapy

## 2021-06-24 DIAGNOSIS — M6281 Muscle weakness (generalized): Secondary | ICD-10-CM

## 2021-06-24 DIAGNOSIS — G8929 Other chronic pain: Secondary | ICD-10-CM | POA: Diagnosis not present

## 2021-06-24 DIAGNOSIS — R29898 Other symptoms and signs involving the musculoskeletal system: Secondary | ICD-10-CM | POA: Diagnosis not present

## 2021-06-24 DIAGNOSIS — M545 Low back pain, unspecified: Secondary | ICD-10-CM | POA: Diagnosis not present

## 2021-06-24 NOTE — Therapy (Signed)
[facility] [address] [place], [place], [address] Phone: [phone]   Fax:  [phone]  Physical Therapy Treatment  Patient Details  Name: Jason Huang MRN: [id number] Date of Birth: 12-11-1950 Referring Provider (PT): Henrine Screws   Encounter Date: 06/24/2021   PT End of Session - 06/24/21 1107     Visit Number 8    Number of Visits 12    Date for PT Re-Evaluation 07/08/21    PT Start Time 1018    PT Stop Time 1105    PT Time Calculation (min) 47 min    Activity Tolerance Patient tolerated treatment well    Behavior During Therapy [facility] for tasks assessed/performed             Past Medical History:  Diagnosis Date   Anxiety    Arthritis    back, wrists elbows knees   Chronic kidney disease    stage 3   Complication of anesthesia    narrow airway   Depression    Diabetes mellitus without complication (HCC)    Dysrhythmia    Afib   Family history of adverse reaction to anesthesia    slow to wake up   Hypertension    Neuromuscular disorder (HCC)    neuropathy in feet   Pneumonia    Sleep apnea    CPAP    Past Surgical History:  Procedure Laterality Date   BACK SURGERY     HARDWARE REMOVAL Left 10/08/2020   Procedure: HARDWARE REMOVAL;  Surgeon: Ollen Gross, MD;  Location: [facility];  Service: Orthopedics;  Laterality: Left;   HIP PINNING Bilateral [id number]   JOINT REPLACEMENT Right 2010   KNEE ARTHROSCOPY Right 2009   NASAL SEPTOPLASTY W/ TURBINOPLASTY  1997   SHOULDER ARTHROSCOPY Right 2008   TONSILLECTOMY     as a child   TOTAL HIP ARTHROPLASTY Left 10/08/2020   Procedure: TOTAL HIP ARTHROPLASTY ANTERIOR APPROACH;  Surgeon: Ollen Gross, MD;  Location: [facility];  Service: Orthopedics;  Laterality: Left;     There were no vitals filed for this visit.   Subjective Assessment - 06/24/21 1024     Subjective Pt states the rainy weather has made him more sore.    Patient  Stated Goals decrease pain    Currently in Pain? Yes    Pain Score 8     Pain Location Back    Pain Orientation Lower                [facility] PT Assessment - 06/24/21 0001       Assessment   Medical Diagnosis lumbar intervertebral DDD    Referring Provider (PT) Henrine Screws    Onset Date/Surgical Date 08/20/04    Hand Dominance Right    Prior Therapy multiple times for back and hip surgeries      AROM   Lumbar Extension about 5 degrees past neutral                           [facility] Adult PT Treatment/Exercise - 06/24/21 0001       Lumbar Exercises: Stretches   Passive Hamstring Stretch Right;Left;2 reps;30 seconds    Passive Hamstring Stretch Limitations seated    Hip Flexor Stretch 30 seconds;3 reps;Right;Left    Hip Flexor Stretch Limitations 2nd set with overhead reach    Other Lumbar Stretch Exercise standing lumbar extension to tolerance 3 x 10 sec  Lumbar Exercises: Aerobic   Nustep L5 x 5 mins for warm up      Lumbar Exercises: Seated   Hip Flexion on Ball Limitations hip flexion seated with 10# KB on LE for lower abdominal activation    Other Seated Lumbar Exercises bilat shoulder extension, row and bow and arrow x 12 blue TB    Other Seated Lumbar Exercises core engaged lifting yellow med ball overhead x 10      Manual Therapy   Soft tissue mobilization STM and IASTM bilat lumbar paraspinals                          PT Long Term Goals - 05/27/21 1244       PT LONG TERM GOAL #1   Title Pt will be independent with HEP    Time 6    Period Weeks    Status New    Target Date 07/08/21      PT LONG TERM GOAL #2   Title Pt will tolerate standing x 30 minutes with pain <= 2/10    Time 6    Period Weeks    Status New    Target Date 07/08/21      PT LONG TERM GOAL #3   Title Pt will tolerate walking x 30 minutes with LRAD with pain <= 2/10    Time 6    Period Weeks    Status New    Target Date 07/08/21                    Plan - 06/24/21 1108     Clinical Impression Statement Pt with good tolerance to exercises, good response to manual therapy. Pt with improved upright posture and tolerance to trunk extension in standing    PT Next Visit Plan update HEP, continue lumbar strength and flexibility    PT Home Exercise Plan [id number]    Consulted and Agree with Plan of Care Patient             Patient will benefit from skilled therapeutic intervention in order to improve the following deficits and impairments:     Visit Diagnosis: Muscle weakness (generalized)  Chronic bilateral low back pain without sciatica  Other symptoms and signs involving the musculoskeletal system     Problem List Patient Active Problem List   Diagnosis Date Noted   S/P total left hip arthroplasty 10/09/2020   OA (osteoarthritis) of hip 10/08/2020   Primary osteoarthritis of left hip 10/08/2020    Jason Huang, PT 06/24/2021, 11:10 AM  [facility] [place] [facility] [place], [place], [address] Phone: [phone]   Fax:  [phone]  Name: Jason Huang MRN: [id number] Date of Birth: 1950/11/08

## 2021-06-26 ENCOUNTER — Other Ambulatory Visit: Payer: Self-pay

## 2021-06-26 ENCOUNTER — Ambulatory Visit (INDEPENDENT_AMBULATORY_CARE_PROVIDER_SITE_OTHER): Payer: Worker's Compensation | Admitting: Physical Therapy

## 2021-06-26 DIAGNOSIS — G8929 Other chronic pain: Secondary | ICD-10-CM

## 2021-06-26 DIAGNOSIS — R29898 Other symptoms and signs involving the musculoskeletal system: Secondary | ICD-10-CM | POA: Diagnosis not present

## 2021-06-26 DIAGNOSIS — M6281 Muscle weakness (generalized): Secondary | ICD-10-CM

## 2021-06-26 DIAGNOSIS — M545 Low back pain, unspecified: Secondary | ICD-10-CM | POA: Diagnosis not present

## 2021-06-26 NOTE — Therapy (Signed)
[facility] [place] [address] [place], [place], [address] Phone: [phone]   Fax:  [phone]  Physical Therapy Treatment  Patient Details  Name: Sesar Madewell MRN: [id number] Date of Birth: [birth year] Referring Provider (PT): Henrine Screws   Encounter Date: 06/26/2021   PT End of Session - 06/26/21 1101     Visit Number 9    Number of Visits 12    Date for PT Re-Evaluation 07/08/21    PT Start Time 1015    PT Stop Time 1100    PT Time Calculation (min) 45 min    Activity Tolerance Patient tolerated treatment well    Behavior During Therapy [facility] for tasks assessed/performed             Past Medical History:  Diagnosis Date   Anxiety    Arthritis    back, wrists elbows knees   Chronic kidney disease    stage 3   Complication of anesthesia    narrow airway   Depression    Diabetes mellitus without complication (HCC)    Dysrhythmia    Afib   Family history of adverse reaction to anesthesia    slow to wake up   Hypertension    Neuromuscular disorder (HCC)    neuropathy in feet   Pneumonia    Sleep apnea    CPAP    Past Surgical History:  Procedure Laterality Date   BACK SURGERY     HARDWARE REMOVAL Left 10/08/2020   Procedure: HARDWARE REMOVAL;  Surgeon: Ollen Gross, MD;  Location: [facility];  Service: Orthopedics;  Laterality: Left;   HIP PINNING Bilateral [id number]   JOINT REPLACEMENT Right 2010   KNEE ARTHROSCOPY Right 2009   NASAL SEPTOPLASTY W/ TURBINOPLASTY  1997   SHOULDER ARTHROSCOPY Right 2008   TONSILLECTOMY     as a child   TOTAL HIP ARTHROPLASTY Left 10/08/2020   Procedure: TOTAL HIP ARTHROPLASTY ANTERIOR APPROACH;  Surgeon: Ollen Gross, MD;  Location: [facility];  Service: Orthopedics;  Laterality: Left;     There were no vitals filed for this visit.   Subjective Assessment - 06/26/21 1025     Subjective Pt states he has new pain in mid back radiating to Rt side near  shoulder blade    Patient Stated Goals decrease pain    Currently in Pain? Yes    Pain Score 9     Pain Location Back                [facility] PT Assessment - 06/26/21 0001       Assessment   Medical Diagnosis lumbar intervertebral DDD    Referring Provider (PT) Henrine Screws    Onset Date/Surgical Date 08/20/04    Hand Dominance Right    Prior Therapy multiple times for back and hip surgeries                           [facility] Adult PT Treatment/Exercise - 06/26/21 0001       Lumbar Exercises: Stretches   Passive Hamstring Stretch Right;Left;2 reps;30 seconds    Passive Hamstring Stretch Limitations seated    Other Lumbar Stretch Exercise standing lumbar extension to tolerance 3 x 10 sec      Lumbar Exercises: Seated   Hip Flexion on Ball Limitations hip flexion seated with 10# KB on LE for lower abdominal activation    Sit to Stand Limitations x 10 with 10# KB  press for core engagement    Other Seated Lumbar Exercises horizontal abduction green TB x 12, diagonal green TB x 10 bilat      Manual Therapy   Soft tissue mobilization STM and IASTM bilat lumbar paraspinals                          PT Long Term Goals - 05/27/21 1244       PT LONG TERM GOAL #1   Title Pt will be independent with HEP    Time 6    Period Weeks    Status New    Target Date 07/08/21      PT LONG TERM GOAL #2   Title Pt will tolerate standing x 30 minutes with pain <= 2/10    Time 6    Period Weeks    Status New    Target Date 07/08/21      PT LONG TERM GOAL #3   Title Pt will tolerate walking x 30 minutes with LRAD with pain <= 2/10    Time 6    Period Weeks    Status New    Target Date 07/08/21                   Plan - 06/26/21 1101     Clinical Impression Statement Session focused on mid back strengthening due to new c/o pain. pt with good tolerance of new exercises, requires more frequent breaks due to fatigue    PT Next Visit Plan  update HEP, continue lumbar strength and flexibility    PT Home Exercise Plan [id number]    Consulted and Agree with Plan of Care Patient             Patient will benefit from skilled therapeutic intervention in order to improve the following deficits and impairments:     Visit Diagnosis: Muscle weakness (generalized)  Chronic bilateral low back pain without sciatica  Other symptoms and signs involving the musculoskeletal system     Problem List Patient Active Problem List   Diagnosis Date Noted   S/P total left hip arthroplasty 10/09/2020   OA (osteoarthritis) of hip 10/08/2020   Primary osteoarthritis of left hip 10/08/2020    Donyea Gafford, PT 06/26/2021, 11:03 AM  [facility] [place], [place], [address] Phone: [phone]   Fax:  [phone]  Name: Jahmier Willett MRN: [id number] Date of Birth: 22-Feb-1951

## 2021-06-29 ENCOUNTER — Ambulatory Visit: Payer: Medicare Other | Admitting: Cardiology

## 2021-06-29 ENCOUNTER — Encounter: Payer: Self-pay | Admitting: Cardiology

## 2021-06-29 ENCOUNTER — Other Ambulatory Visit: Payer: Self-pay

## 2021-06-29 VITALS — BP 148/63 | HR 71 | Temp 97.9°F | Resp 16 | Ht 67.0 in | Wt 265.2 lb

## 2021-06-29 DIAGNOSIS — I5032 Chronic diastolic (congestive) heart failure: Secondary | ICD-10-CM | POA: Diagnosis not present

## 2021-06-29 DIAGNOSIS — I10 Essential (primary) hypertension: Secondary | ICD-10-CM | POA: Diagnosis not present

## 2021-06-29 DIAGNOSIS — I482 Chronic atrial fibrillation, unspecified: Secondary | ICD-10-CM | POA: Diagnosis not present

## 2021-06-29 DIAGNOSIS — E7800 Pure hypercholesterolemia, unspecified: Secondary | ICD-10-CM

## 2021-06-29 DIAGNOSIS — I480 Paroxysmal atrial fibrillation: Secondary | ICD-10-CM | POA: Diagnosis not present

## 2021-06-29 DIAGNOSIS — I6523 Occlusion and stenosis of bilateral carotid arteries: Secondary | ICD-10-CM | POA: Diagnosis not present

## 2021-06-29 NOTE — Progress Notes (Signed)
Primary Physician/Referring:  Aura Dials, MD  Patient ID: Jason Huang, male    DOB: 05/03/1951, 70 y.o.   MRN: [id number]  Chief Complaint  Patient presents with   Hypertension   Chronic Diastolic Heart Failure   Edema   Atrial Fibrillation   Follow-up    6 months   HPI:    Jason Huang  is a 70 y.o. Caucasian male with degenerative joint disease, chronic back pain with extensive lumbar surgery in the remote past, diabetes mellitus with chronic stage III kidney disease with hyperkalemia and follows nephrology,  OSA on CPAP and peripheral neuropathy, found to have new onset atrial fibrillation during preoperative EKG in February 2022.   He also has severe peripheral arterial disease by physical exam especially involving right hip and right leg worse than the left.  He also has pseudoclaudication from spinal stenosis.   Is a 70-monthoffice visit.  Symptoms of claudication has remained stable.  No limb threatening ischemia, he has not dropped any ulceration.  He is tolerating anticoagulation without any bleeding diathesis.  Dyspnea is remained stable.  No PND or orthopnea.  Past Medical History:  Diagnosis Date   Anxiety    Arthritis    back, wrists elbows knees   Chronic kidney disease    stage 3   Complication of anesthesia    narrow airway   Depression    Diabetes mellitus without complication (HCC)    Dysrhythmia    Afib   Family history of adverse reaction to anesthesia    slow to wake up   Hypertension    Neuromuscular disorder (HCC)    neuropathy in feet   Pneumonia    Sleep apnea    CPAP   Past Surgical History:  Procedure Laterality Date   BACK SURGERY     HARDWARE REMOVAL Left 10/08/2020   Procedure: HARDWARE REMOVAL;  Surgeon: AGaynelle Arabian MD;  Location: [facility];  Service: Orthopedics;  Laterality: Left;   HIP PINNING Bilateral [id number]  JOINT REPLACEMENT Right 2010   KNEE ARTHROSCOPY Right 2009   NASAL SEPTOPLASTY W/ TURBINOPLASTY  1997    SHOULDER ARTHROSCOPY Right 2008   TONSILLECTOMY     as a child   TOTAL HIP ARTHROPLASTY Left 10/08/2020   Procedure: TOTAL HIP ARTHROPLASTY ANTERIOR APPROACH;  Surgeon: AGaynelle Arabian MD;  Location: [facility];  Service: Orthopedics;  Laterality: Left;  1012m   Family History  Problem Relation Age of Onset   Colon cancer Mother    Non-Hodgkin's lymphoma Father    Colon cancer Brother     Social History   Tobacco Use   Smoking status: Former    Packs/day: 0.50    Years: 15.00    Pack years: 7.50    Types: Cigarettes    Quit date: 2000    Years since quitting: 22.9   Smokeless tobacco: Never  Substance Use Topics   Alcohol use: Not Currently   Marital Status: Unknown  ROS  Review of Systems  Cardiovascular:  Positive for claudication and dyspnea on exertion. Negative for chest pain and leg swelling.  Respiratory:  Positive for snoring (OSA on CPAP).   Musculoskeletal:  Positive for arthritis, back pain (major back surgery and now walks with cane), joint pain (left hip > right and bilateral knee) and muscle cramps.  Gastrointestinal:  Negative for melena.  Objective  Blood pressure (!) 148/63, pulse 71, temperature 97.9 F (36.6 C), temperature source Temporal, resp. rate 16, height 5' 7" (1.702 m),  weight 265 lb 3.2 oz (120.3 kg), SpO2 94 %.  Vitals with BMI 06/29/2021 06/29/2021 12/26/2020  Height - 5' 7" 5' 7"  Weight - 265 lbs 3 oz 246 lbs  BMI - 41.53 38.52  Systolic 148 164 129  Diastolic 63 71 59  Pulse 71 69 63     Physical Exam Constitutional:      Appearance: He is 70 morbidly obese.     Comments: Morbidly obese  Neck:     Vascular: No carotid bruit or JVD.  Cardiovascular:     Rate and Rhythm: Regular rhythm.     Pulses:          Carotid pulses are 2+ on the right side and 2+ on the left side.      Femoral pulses are 1+ on the right side with bruit and 0 on the left side with bruit.      Popliteal pulses are 0 on the right side and 0 on the left side.        Dorsalis pedis pulses are 0 on the right side and 0 on the left side.       Posterior tibial pulses are 0 on the right side and 0 on the left side.     Heart sounds: Heart sounds are distant. No murmur heard.   No gallop.     Comments: Chronic venous stasis dermatitis changes noted.  Mild superficial varicose veins noted bilaterally. Pulmonary:     Effort: Pulmonary effort is normal.     Breath sounds: Normal breath sounds.  Abdominal:     General: Abdomen is flat.     Palpations: Abdomen is soft.  Musculoskeletal:     Cervical back: Normal range of motion.     Right lower leg: No edema.     Left lower leg: No edema.   Laboratory examination:   Recent Labs    10/01/20 1106 10/09/20 0253 10/10/20 0246  NA 138 136 136  K 4.7 4.8 3.7  CL 102 104 101  CO2 27 25 26  GLUCOSE 171* 215* 236*  BUN 33* 34* 35*  CREATININE 1.58* 1.47* 1.24  CALCIUM 9.2 8.8* 8.7*  GFRNONAA 47* 51* >60   CrCl cannot be calculated (Patient's most recent lab result is older than the maximum 21 days allowed.).  CMP Latest Ref Rng & Units 10/10/2020 10/09/2020 10/01/2020  Glucose 70 - 99 mg/dL 236(H) 215(H) 171(H)  BUN 8 - 23 mg/dL 35(H) 34(H) 33(H)  Creatinine 0.61 - 1.24 mg/dL 1.24 1.47(H) 1.58(H)  Sodium 135 - 145 mmol/L 136 136 138  Potassium 3.5 - 5.1 mmol/L 3.7 4.8 4.7  Chloride 98 - 111 mmol/L 101 104 102  CO2 22 - 32 mmol/L 26 25 27  Calcium 8.9 - 10.3 mg/dL 8.7(L) 8.8(L) 9.2  Total Protein 6.5 - 8.1 g/dL - - 7.2  Total Bilirubin 0.3 - 1.2 mg/dL - - 0.4  Alkaline Phos 38 - 126 U/L - - 48  AST 15 - 41 U/L - - 19  ALT 0 - 44 U/L - - 18   CBC Latest Ref Rng & Units 10/11/2020 10/10/2020 10/09/2020  WBC 4.0 - 10.5 K/uL 11.0(H) 11.1(H) 9.3  Hemoglobin 13.0 - 17.0 g/dL 9.5(L) 10.0(L) 10.2(L)  Hematocrit 39.0 - 52.0 % 29.0(L) 30.6(L) 30.9(L)  Platelets 150 - 400 K/uL 141(L) 153 143(L)   Lipid Panel No results for input(s): CHOL, TRIG, LDLCALC, VLDL, HDL, CHOLHDL, LDLDIRECT in the last 8760  hours.  HEMOGLOBIN A1C Lab Results    Component Value Date   HGBA1C 6.5 (H) 10/01/2020   MPG 139.85 10/01/2020   TSH No results for input(s): TSH in the last 8760 hours.  BNP (last 3 results) No results for input(s): BNP in the last 8760 hours.  ProBNP (last 3 results) Recent Labs    01/22/21 1052  PROBNP 222     External labs:   Labs 08/05/2020:  A1c 6.3%.  HGB 13.1   13.0-17.0 HCT 38.7   39.0-52.0 MCV 90.3   80.0-94.0 MCH 30.6   27.0-33.0 MCHC 33.9   32.0-36.0 RDW 13.7   11.5-15.5 PLT 152   150-400  BUN 33   6-26 Creatinine 1.50   0.60-1.30 Sodium 143   136-145 Potassium 4.9   3.5-5.5 eGFR 41 mL  Cholesterol, total 115.000 m 11/30/2019 HDL 42.000 mg 11/30/2019 LDL 44.000 mg 11/30/2019 Triglycerides 176.000 m 11/30/2019   Medications and allergies   Allergies  Allergen Reactions   Penicillins Anaphylaxis   Sulfa Antibiotics Rash   Celebrex [Celecoxib] Rash    Headache    Vioxx [Rofecoxib] Rash     Outpatient Medications Prior to Visit  Medication Sig Dispense Refill   acetaminophen (TYLENOL) 500 MG tablet Take 500 mg by mouth every 6 (six) hours as needed for headache.     Ascorbic Acid (VITAMIN C) 1000 MG tablet Take 1,000 mg by mouth daily.     docusate sodium (COLACE) 100 MG capsule Take 100-200 mg by mouth daily as needed for mild constipation.     escitalopram (LEXAPRO) 20 MG tablet Take 20 mg by mouth daily.  1   gabapentin (NEURONTIN) 100 MG capsule Take 200 mg by mouth 4 (four) times daily.     glipiZIDE (GLUCOTROL) 10 MG tablet Take 10 mg by mouth 2 (two) times daily.  4   insulin glargine (LANTUS) 100 UNIT/ML Solostar Pen Inject 40 Units into the skin at bedtime.     lidocaine (LIDODERM) 5 % Place 1 patch onto the skin daily as needed (pain).     Melatonin 10 MG TABS Take 10 mg by mouth at bedtime.     methocarbamol (ROBAXIN) 500 MG tablet Take 1 tablet (500 mg total) by mouth every 6 (six) hours as needed for muscle spasms. 40 tablet 0    metoprolol tartrate (LOPRESSOR) 25 MG tablet TAKE ONE TABLET BY MOUTH TWICE DAILY 60 tablet 3   oxyCODONE (ROXICODONE) 15 MG immediate release tablet Take 15 mg by mouth 3 (three) times daily.  0   Polyethyl Glycol-Propyl Glycol (SYSTANE OP) Place 1 drop into both eyes 2 (two) times daily as needed (dry eyes).     Rivaroxaban (XARELTO) 15 MG TABS tablet Take 1 tablet (15 mg total) by mouth daily with supper. 30 tablet 6   simvastatin (ZOCOR) 40 MG tablet Take 1 tablet by mouth every evening.     torsemide (DEMADEX) 20 MG tablet Take 1 tablet (20 mg total) by mouth daily as needed. For leg swelling 90 tablet 3   trandolapril (MAVIK) 1 MG tablet Take 1 mg by mouth daily.     traZODone (DESYREL) 100 MG tablet Take 100 mg by mouth at bedtime.     TRULICITY 1.5 MG/0.5ML SOPN Inject 1.5 mg into the muscle every Tuesday.  10   vitamin B-12 (CYANOCOBALAMIN) 1000 MCG tablet Take 1,000 mcg by mouth daily.     HYDROmorphone (DILAUDID) 2 MG tablet Take 1-2 tablets (2-4 mg total) by mouth every 6 (six) hours as needed for severe pain. 42 tablet   0   pioglitazone (ACTOS) 30 MG tablet Take 30 mg by mouth daily.     No facility-administered medications prior to visit.   Final Medication list after present encounter: Current Outpatient Medications  Medication Instructions   acetaminophen (TYLENOL) 500 mg, Oral, Every 6 hours PRN   docusate sodium (COLACE) 100-200 mg, Oral, Daily PRN   escitalopram (LEXAPRO) 20 mg, Oral, Daily   gabapentin (NEURONTIN) 200 mg, Oral, 4 times daily   glipiZIDE (GLUCOTROL) 10 mg, Oral, 2 times daily   insulin glargine (LANTUS) 40 Units, Subcutaneous, Daily at bedtime   lidocaine (LIDODERM) 5 % 1 patch, Transdermal, Daily PRN   Melatonin 10 mg, Oral, Daily at bedtime   methocarbamol (ROBAXIN) 500 mg, Oral, Every 6 hours PRN   metoprolol tartrate (LOPRESSOR) 25 MG tablet TAKE ONE TABLET BY MOUTH TWICE DAILY   oxyCODONE (ROXICODONE) 15 mg, Oral, 3 times daily   Polyethyl  Glycol-Propyl Glycol (SYSTANE OP) 1 drop, Both Eyes, 2 times daily PRN   Rivaroxaban (XARELTO) 15 mg, Oral, Daily with supper   simvastatin (ZOCOR) 40 MG tablet 1 tablet, Oral, Every evening   torsemide (DEMADEX) 20 mg, Oral, Daily PRN, For leg swelling   trandolapril (MAVIK) 1 mg, Oral, Daily   traZODone (DESYREL) 100 mg, Oral, Daily at bedtime   Trulicity 1.5 mg, Intramuscular, Every Tue   vitamin B-12 (CYANOCOBALAMIN) 1,000 mcg, Oral, Daily   vitamin C 1,000 mg, Oral, Daily    Radiology:   No results found.  Cardiac Studies:   PCV MYOCARDIAL PERFUSION WITH LEXISCAN 08/25/2020 Lexiscan nuclear stress test performed using 1-day protocol. Stress EKG is non-diagnostic, as this is pharmacological stress test. In addition, rest and stress EKG showed atrial fibrillation with controlled ventricular rate. Normal myocardial perfusion. Stress LVEF 72%. Low risk study.  PCV ECHOCARDIOGRAM COMPLETE 13/24/4010 Normal LV systolic function with visual EF 55-60%. Left ventricle cavity is normal in size. Mild left ventricular hypertrophy. Normal global wall motion. Indeterminate diastolic filling pattern, elevated LAP. No significant valvular heart disease. No prior study for comparison.   Lower Extremity Arterial Duplex 09/04/2020:  Significant elevation of the peak systolic velocity is seen in the EIA and CFA suggestive of >50% stenosis. There is marked heterogeneous plaque noted. The right PT appears occluded. Remaining right lower extremity does not suggest a hemodynamically significant stenosis.  No significant elevation of the peak systolic velocity is seen in the left lower extremity to suggest a hemodynamically significant stenosis.  This exam reveals mildly decreased perfusion of the right lower extremity, noted at the anterior tibial artery level (ABI 0.86) and mildly decreased perfusion of the left lower extremity, noted at the anterior tibal and post tibial artery level (ABI 0.86).    Carotid artery duplex 11/21/2020: Stenosis in the right internal carotid artery (16-49%). Homogeneous plaque noted. Stenosis in the right external carotid artery (<50%). Stenosis in the left internal carotid artery (16-49%). Stenosis in the left external carotid artery (<50%). Heterogeneous plaque noted. Antegrade right vertebral artery flow. Antegrade left vertebral artery flow. Follow up in one year is appropriate if clinically indicated.  EKG:   EKG 06/29/2021: Sinus rhythm with first-degree AV block at rate of 68 bpm, left atrial enlargement, normal axis.  Incomplete right bundle branch block.  No evidence of ischemia.  No significant change from EKG 12/26/2020   EKG 09/12/2020: Atrial fibrillation with controlled ventricular response at rate of 64 bpm, left axis deviation, left anterior fascicular block.  Incomplete right bundle branch block.  Low-voltage complexes.  Normal QT interval.   Assessment     ICD-10-CM   1. Paroxysmal atrial fibrillation (HCC)  I48.0     2. Essential hypertension  I10     3. Chronic diastolic (congestive) heart failure (HCC)  I50.32 EKG 12-Lead    4. Asymptomatic bilateral carotid artery stenosis  I65.23     5. Pure hypercholesterolemia  E78.00 Lipid Panel With LDL/HDL Ratio    LDL cholesterol, direct      CHA2DS2-VASc Score is 4.  Yearly risk of stroke: 4.8% (A, DM, HTN, Vasc Dz).  Score of 1=0.6; 2=2.2; 3=3.2; 4=4.8; 5=7.2; 6=9.8; 7=>9.8) -(CHF; HTN; vasc disease DM,  Male = 1; Age <65 =0; 65-74 = 1,  >75 =2; stroke/embolism= 2).    Medications Discontinued During This Encounter  Medication Reason   HYDROmorphone (DILAUDID) 2 MG tablet    pioglitazone (ACTOS) 30 MG tablet     No orders of the defined types were placed in this encounter.  Orders Placed This Encounter  Procedures   Lipid Panel With LDL/HDL Ratio   LDL cholesterol, direct   EKG 12-Lead   Recommendations:   Jason Huang is a 70 y.o. Caucasian male with degenerative  joint disease, chronic back pain with extensive lumbar surgery in the remote past, diabetes mellitus with chronic stage III kidney disease with hyperkalemia and follows nephrology,  OSA on CPAP and peripheral neuropathy, found to have new onset atrial fibrillation during preoperative EKG in February 2022.   He also has severe peripheral arterial disease by physical exam especially involving right hip and right leg worse than the left.  He also has pseudoclaudication from spinal stenosis.    He is maintaining sinus rhythm, tolerating anticoagulation.  Blood pressures well controlled, no clinical evidence of heart failure.  Bilateral carotid bruit has remained stable, he has repeat carotid duplex scheduled in 6 months.  I reviewed his labs, renal function has remained stable, he does not have recent lipids, orders placed.  He is now tolerating CPAP and is using it regularly.  Weight loss discussed, stable otherwise.  I would like to see him back in 6 months for follow-up.    Adrian Prows, MD, [facility] 06/29/2021, 11:24 AM Office: [phone] Pager: [phone]

## 2021-07-01 ENCOUNTER — Other Ambulatory Visit: Payer: Self-pay

## 2021-07-01 ENCOUNTER — Ambulatory Visit (INDEPENDENT_AMBULATORY_CARE_PROVIDER_SITE_OTHER): Payer: Worker's Compensation | Admitting: Physical Therapy

## 2021-07-01 DIAGNOSIS — M6281 Muscle weakness (generalized): Secondary | ICD-10-CM

## 2021-07-01 DIAGNOSIS — M545 Low back pain, unspecified: Secondary | ICD-10-CM | POA: Diagnosis not present

## 2021-07-01 DIAGNOSIS — G8929 Other chronic pain: Secondary | ICD-10-CM

## 2021-07-01 DIAGNOSIS — R29898 Other symptoms and signs involving the musculoskeletal system: Secondary | ICD-10-CM | POA: Diagnosis not present

## 2021-07-01 NOTE — Therapy (Signed)
[facility] [address] [place], [place], [address] Phone: [phone]   Fax:  [phone]  Physical Therapy Treatment  Patient Details  Name: Jason NielsenRobert Huang MRN: [id number] Date of Birth: 07/16/1951 Referring Provider (PT): Jason Screwshacker, Jason   Encounter Date: 07/01/2021   PT End of Session - 07/01/21 1150     Visit Number 10    Number of Visits 12    Date for PT Re-Evaluation 07/08/21    PT Start Time 1015    PT Stop Time 1100    PT Time Calculation (min) 45 min    Activity Tolerance Patient tolerated treatment well    Behavior During Therapy [facility] for tasks assessed/performed             Past Medical History:  Diagnosis Date   Anxiety    Arthritis    back, wrists elbows knees   Chronic kidney disease    stage 3   Complication of anesthesia    narrow airway   Depression    Diabetes mellitus without complication (HCC)    Dysrhythmia    Afib   Family history of adverse reaction to anesthesia    slow to wake up   Hypertension    Neuromuscular disorder (HCC)    neuropathy in feet   Pneumonia    Sleep apnea    CPAP    Past Surgical History:  Procedure Laterality Date   BACK SURGERY     HARDWARE REMOVAL Left 10/08/2020   Procedure: HARDWARE REMOVAL;  Surgeon: Jason GrossAluisio, Frank, MD;  Location: [facility];  Service: Orthopedics;  Laterality: Left;   HIP PINNING Bilateral [id number]   JOINT REPLACEMENT Right 2010   KNEE ARTHROSCOPY Right 2009   NASAL SEPTOPLASTY W/ TURBINOPLASTY  1997   SHOULDER ARTHROSCOPY Right 2008   TONSILLECTOMY     as a child   TOTAL HIP ARTHROPLASTY Left 10/08/2020   Procedure: TOTAL HIP ARTHROPLASTY ANTERIOR APPROACH;  Surgeon: Jason GrossAluisio, Frank, MD;  Location: [facility];  Service: Orthopedics;  Laterality: Left;  100min    There were no vitals filed for this visit.       [facility] - 07/01/21 0001       Assessment   Medical Diagnosis lumbar intervertebral DDD     Referring Provider (PT) Jason Screwshacker, Jason    Onset Date/Surgical Date 08/20/04    Hand Dominance Right    Prior Therapy multiple times for back and hip surgeries      AROM   Lumbar Flexion limited 10%    Lumbar Extension 5 degrees past neutral    Lumbar - Right Side Bend limited 50%    Lumbar - Left Side Bend limited 50%    Lumbar - Right Rotation limited 50%    Lumbar - Left Rotation limited 25%                           [facility] Adult PT Treatment/Exercise - 07/01/21 0001       Lumbar Exercises: Stretches   Passive Hamstring Stretch Right;Left;2 reps;30 seconds    Passive Hamstring Stretch Limitations seated    Hip Flexor Stretch 3 reps;30 seconds    Hip Flexor Stretch Limitations bilat      Lumbar Exercises: Seated   Sit to Stand Limitations x 10 with 10# KB press for core engagement    Other Seated Lumbar Exercises diagonal green TB x 10 bilat    Other Seated Lumbar Exercises seated  antirotation green TB x 10 bilat, bow and arrow blue TB x 10 bilat      Manual Therapy   Soft tissue mobilization STM and IASTM bilat lumbar paraspinals                          PT Long Term Goals - 07/01/21 1222       PT LONG TERM GOAL #1   Title Pt will be independent with HEP    Status On-going      PT LONG TERM GOAL #2   Title Pt will tolerate standing x 30 minutes with pain <= 2/10    Status On-going      PT LONG TERM GOAL #3   Title Pt will tolerate walking x 30 minutes with LRAD with pain <= 2/10    Status On-going                   Plan - 07/01/21 1150     Clinical Impression Statement Pt with reduced mid back pain today and good tolerance of low back and mid back exercises. Good response to manual therapy. Pt improving lumbar ROM and upright posture and will continue to benefit from skilled PT    PT Next Visit Plan update HEP, continue lumbar strength and flexibility    PT Home Exercise Plan [id number]    Consulted and Agree with Plan  of Care Patient             Patient will benefit from skilled therapeutic intervention in order to improve the following deficits and impairments:     Visit Diagnosis: Muscle weakness (generalized)  Chronic bilateral low back pain without sciatica  Other symptoms and signs involving the musculoskeletal system     Problem List Patient Active Problem List   Diagnosis Date Noted   Paroxysmal atrial fibrillation (HCC) 06/29/2021   Essential hypertension 06/29/2021   Chronic diastolic (congestive) heart failure (HCC) 06/29/2021   Asymptomatic bilateral carotid artery stenosis 06/29/2021   Pure hypercholesterolemia 06/29/2021   S/P total left hip arthroplasty 10/09/2020   OA (osteoarthritis) of hip 10/08/2020   Primary osteoarthritis of left hip 10/08/2020    Jason Huang, PT 07/01/2021, 12:23 PM  [facility] [address] [place], [place], [address] Phone: [phone]   Fax:  [phone]  Name: Jason Huang MRN: [id number] Date of Birth: 1951/06/15

## 2021-07-07 DIAGNOSIS — E7800 Pure hypercholesterolemia, unspecified: Secondary | ICD-10-CM | POA: Diagnosis not present

## 2021-07-08 ENCOUNTER — Encounter: Payer: Medicare Other | Admitting: Physical Therapy

## 2021-07-08 LAB — LIPID PANEL WITH LDL/HDL RATIO
Cholesterol, Total: 127 mg/dL (ref 100–199)
HDL: 40 mg/dL (ref >39)
LDL Chol Calc (NIH): 59 mg/dL (ref 0–99)
LDL/HDL Ratio: 1.5 ratio (ref 0.0–3.6)
Triglycerides: 164 mg/dL — ABNORMAL HIGH (ref 0–149)
VLDL Cholesterol Cal: 28 mg/dL (ref 5–40)

## 2021-07-08 LAB — LDL CHOLESTEROL, DIRECT: LDL Direct: 60 mg/dL (ref 0–99)

## 2021-07-08 LAB — HIGH SENSITIVITY CRP: CRP, High Sensitivity: 4.17 mg/L — ABNORMAL HIGH (ref 0.00–3.00)

## 2021-07-08 NOTE — Progress Notes (Signed)
See if he qualifies in  CRP trial and let me know

## 2021-07-09 NOTE — Progress Notes (Signed)
Let patient know please and keep me informed

## 2021-07-10 DIAGNOSIS — I4891 Unspecified atrial fibrillation: Secondary | ICD-10-CM | POA: Diagnosis not present

## 2021-07-10 DIAGNOSIS — E782 Mixed hyperlipidemia: Secondary | ICD-10-CM | POA: Diagnosis not present

## 2021-07-10 DIAGNOSIS — E1142 Type 2 diabetes mellitus with diabetic polyneuropathy: Secondary | ICD-10-CM | POA: Diagnosis not present

## 2021-07-10 DIAGNOSIS — G8929 Other chronic pain: Secondary | ICD-10-CM | POA: Diagnosis not present

## 2021-07-10 DIAGNOSIS — E1140 Type 2 diabetes mellitus with diabetic neuropathy, unspecified: Secondary | ICD-10-CM | POA: Diagnosis not present

## 2021-07-10 DIAGNOSIS — N183 Chronic kidney disease, stage 3 unspecified: Secondary | ICD-10-CM | POA: Diagnosis not present

## 2021-07-10 DIAGNOSIS — M1612 Unilateral primary osteoarthritis, left hip: Secondary | ICD-10-CM | POA: Diagnosis not present

## 2021-07-29 ENCOUNTER — Other Ambulatory Visit: Payer: Self-pay | Admitting: Cardiology

## 2021-07-29 DIAGNOSIS — Z7984 Long term (current) use of oral hypoglycemic drugs: Secondary | ICD-10-CM | POA: Diagnosis not present

## 2021-07-29 DIAGNOSIS — E782 Mixed hyperlipidemia: Secondary | ICD-10-CM | POA: Diagnosis not present

## 2021-07-29 DIAGNOSIS — Z79899 Other long term (current) drug therapy: Secondary | ICD-10-CM | POA: Diagnosis not present

## 2021-07-29 DIAGNOSIS — N183 Chronic kidney disease, stage 3 unspecified: Secondary | ICD-10-CM | POA: Diagnosis not present

## 2021-07-29 DIAGNOSIS — Z794 Long term (current) use of insulin: Secondary | ICD-10-CM | POA: Diagnosis not present

## 2021-07-29 DIAGNOSIS — M5136 Other intervertebral disc degeneration, lumbar region: Secondary | ICD-10-CM | POA: Diagnosis not present

## 2021-07-29 DIAGNOSIS — I5033 Acute on chronic diastolic (congestive) heart failure: Secondary | ICD-10-CM

## 2021-07-29 DIAGNOSIS — I4891 Unspecified atrial fibrillation: Secondary | ICD-10-CM | POA: Diagnosis not present

## 2021-07-29 DIAGNOSIS — G4733 Obstructive sleep apnea (adult) (pediatric): Secondary | ICD-10-CM | POA: Diagnosis not present

## 2021-07-29 DIAGNOSIS — E1140 Type 2 diabetes mellitus with diabetic neuropathy, unspecified: Secondary | ICD-10-CM | POA: Diagnosis not present

## 2021-07-30 ENCOUNTER — Encounter: Payer: Self-pay | Admitting: Cardiology

## 2021-08-12 ENCOUNTER — Other Ambulatory Visit: Payer: Self-pay | Admitting: Pharmacist

## 2021-08-12 MED ORDER — RIVAROXABAN 15 MG PO TABS: 15.0000 mg | ORAL_TABLET | Freq: Every day | ORAL | 3 refills | Status: DC

## 2021-08-18 DIAGNOSIS — N1831 Chronic kidney disease, stage 3a: Secondary | ICD-10-CM | POA: Diagnosis not present

## 2021-08-18 DIAGNOSIS — Z794 Long term (current) use of insulin: Secondary | ICD-10-CM | POA: Diagnosis not present

## 2021-08-18 DIAGNOSIS — N183 Chronic kidney disease, stage 3 unspecified: Secondary | ICD-10-CM | POA: Diagnosis not present

## 2021-08-18 DIAGNOSIS — I129 Hypertensive chronic kidney disease with stage 1 through stage 4 chronic kidney disease, or unspecified chronic kidney disease: Secondary | ICD-10-CM | POA: Diagnosis not present

## 2021-08-18 DIAGNOSIS — E1122 Type 2 diabetes mellitus with diabetic chronic kidney disease: Secondary | ICD-10-CM | POA: Diagnosis not present

## 2021-08-20 DIAGNOSIS — Z6841 Body Mass Index (BMI) 40.0 and over, adult: Secondary | ICD-10-CM | POA: Diagnosis not present

## 2021-08-20 DIAGNOSIS — R801 Persistent proteinuria, unspecified: Secondary | ICD-10-CM | POA: Diagnosis not present

## 2021-08-20 DIAGNOSIS — Z794 Long term (current) use of insulin: Secondary | ICD-10-CM | POA: Diagnosis not present

## 2021-08-20 DIAGNOSIS — N401 Enlarged prostate with lower urinary tract symptoms: Secondary | ICD-10-CM | POA: Diagnosis not present

## 2021-08-20 DIAGNOSIS — N1831 Chronic kidney disease, stage 3a: Secondary | ICD-10-CM | POA: Diagnosis not present

## 2021-08-20 DIAGNOSIS — I129 Hypertensive chronic kidney disease with stage 1 through stage 4 chronic kidney disease, or unspecified chronic kidney disease: Secondary | ICD-10-CM | POA: Diagnosis not present

## 2021-08-20 DIAGNOSIS — N183 Chronic kidney disease, stage 3 unspecified: Secondary | ICD-10-CM | POA: Diagnosis not present

## 2021-08-20 DIAGNOSIS — E1122 Type 2 diabetes mellitus with diabetic chronic kidney disease: Secondary | ICD-10-CM | POA: Diagnosis not present

## 2021-08-20 DIAGNOSIS — N138 Other obstructive and reflux uropathy: Secondary | ICD-10-CM | POA: Diagnosis not present

## 2021-08-20 DIAGNOSIS — M898X9 Other specified disorders of bone, unspecified site: Secondary | ICD-10-CM | POA: Diagnosis not present

## 2021-08-20 DIAGNOSIS — D631 Anemia in chronic kidney disease: Secondary | ICD-10-CM | POA: Diagnosis not present

## 2021-09-04 DIAGNOSIS — R062 Wheezing: Secondary | ICD-10-CM | POA: Diagnosis not present

## 2021-09-04 DIAGNOSIS — R059 Cough, unspecified: Secondary | ICD-10-CM | POA: Diagnosis not present

## 2021-09-11 DIAGNOSIS — J209 Acute bronchitis, unspecified: Secondary | ICD-10-CM | POA: Diagnosis not present

## 2021-09-15 DIAGNOSIS — N183 Chronic kidney disease, stage 3 unspecified: Secondary | ICD-10-CM | POA: Diagnosis not present

## 2021-09-16 ENCOUNTER — Other Ambulatory Visit: Payer: Self-pay | Admitting: Cardiology

## 2021-09-16 DIAGNOSIS — I471 Supraventricular tachycardia: Secondary | ICD-10-CM

## 2021-11-04 DIAGNOSIS — D631 Anemia in chronic kidney disease: Secondary | ICD-10-CM | POA: Diagnosis not present

## 2021-11-04 DIAGNOSIS — E1142 Type 2 diabetes mellitus with diabetic polyneuropathy: Secondary | ICD-10-CM | POA: Diagnosis not present

## 2021-11-04 DIAGNOSIS — M25532 Pain in left wrist: Secondary | ICD-10-CM | POA: Diagnosis not present

## 2021-11-04 DIAGNOSIS — Z6841 Body Mass Index (BMI) 40.0 and over, adult: Secondary | ICD-10-CM | POA: Diagnosis not present

## 2021-11-04 DIAGNOSIS — G4733 Obstructive sleep apnea (adult) (pediatric): Secondary | ICD-10-CM | POA: Diagnosis not present

## 2021-11-04 DIAGNOSIS — I4891 Unspecified atrial fibrillation: Secondary | ICD-10-CM | POA: Diagnosis not present

## 2021-11-04 DIAGNOSIS — G894 Chronic pain syndrome: Secondary | ICD-10-CM | POA: Diagnosis not present

## 2021-11-04 DIAGNOSIS — M5136 Other intervertebral disc degeneration, lumbar region: Secondary | ICD-10-CM | POA: Diagnosis not present

## 2021-11-04 DIAGNOSIS — E782 Mixed hyperlipidemia: Secondary | ICD-10-CM | POA: Diagnosis not present

## 2021-11-04 DIAGNOSIS — N183 Chronic kidney disease, stage 3 unspecified: Secondary | ICD-10-CM | POA: Diagnosis not present

## 2021-11-10 DIAGNOSIS — N183 Chronic kidney disease, stage 3 unspecified: Secondary | ICD-10-CM | POA: Diagnosis not present

## 2021-11-10 DIAGNOSIS — G8929 Other chronic pain: Secondary | ICD-10-CM | POA: Diagnosis not present

## 2021-11-10 DIAGNOSIS — I4891 Unspecified atrial fibrillation: Secondary | ICD-10-CM | POA: Diagnosis not present

## 2021-11-10 DIAGNOSIS — E782 Mixed hyperlipidemia: Secondary | ICD-10-CM | POA: Diagnosis not present

## 2021-11-10 DIAGNOSIS — E1142 Type 2 diabetes mellitus with diabetic polyneuropathy: Secondary | ICD-10-CM | POA: Diagnosis not present

## 2021-11-11 DIAGNOSIS — M1990 Unspecified osteoarthritis, unspecified site: Secondary | ICD-10-CM | POA: Diagnosis not present

## 2021-11-20 DIAGNOSIS — Z96642 Presence of left artificial hip joint: Secondary | ICD-10-CM | POA: Diagnosis not present

## 2021-12-16 DIAGNOSIS — G8929 Other chronic pain: Secondary | ICD-10-CM | POA: Diagnosis not present

## 2021-12-16 DIAGNOSIS — E782 Mixed hyperlipidemia: Secondary | ICD-10-CM | POA: Diagnosis not present

## 2021-12-16 DIAGNOSIS — E1140 Type 2 diabetes mellitus with diabetic neuropathy, unspecified: Secondary | ICD-10-CM | POA: Diagnosis not present

## 2021-12-16 DIAGNOSIS — N183 Chronic kidney disease, stage 3 unspecified: Secondary | ICD-10-CM | POA: Diagnosis not present

## 2021-12-30 ENCOUNTER — Ambulatory Visit: Payer: Medicare Other | Admitting: Student

## 2021-12-30 ENCOUNTER — Ambulatory Visit: Payer: Medicare Other | Admitting: Cardiology

## 2021-12-30 ENCOUNTER — Encounter: Payer: Self-pay | Admitting: Student

## 2021-12-30 VITALS — BP 149/65 | HR 82 | Temp 98.0°F | Resp 17 | Ht 67.0 in | Wt 259.2 lb

## 2021-12-30 DIAGNOSIS — I6523 Occlusion and stenosis of bilateral carotid arteries: Secondary | ICD-10-CM | POA: Diagnosis not present

## 2021-12-30 DIAGNOSIS — I480 Paroxysmal atrial fibrillation: Secondary | ICD-10-CM

## 2021-12-30 DIAGNOSIS — I10 Essential (primary) hypertension: Secondary | ICD-10-CM

## 2021-12-30 DIAGNOSIS — I5032 Chronic diastolic (congestive) heart failure: Secondary | ICD-10-CM | POA: Diagnosis not present

## 2021-12-30 NOTE — Progress Notes (Signed)
Primary Physician/Referring:  Henrine Screws, MD  Patient ID: Jason Huang, male    DOB: January 14, 1951, 71 y.o.   MRN: [id number]  Chief Complaint  Patient presents with   paf   Hypertension   carotid stenosis   HPI:    Jason Huang  is a 71 y.o. Caucasian male with degenerative joint disease, chronic back pain with extensive lumbar surgery in the remote past, diabetes mellitus with chronic stage III kidney disease with hyperkalemia and follows nephrology,  OSA on CPAP and peripheral neuropathy, found to have new onset atrial fibrillation during preoperative EKG in February 2022.   He also has severe peripheral arterial disease by physical exam especially involving right hip and right leg worse than the left.  He also has pseudoclaudication from spinal stenosis.     Patient was last seen in the office 06/29/2021 by Dr. Jacinto Halim at which time patient was stable from a cardiovascular standpoint so no changes were made.  Repeat lipid profile testing since last office visit shows LDL is well controlled, however triglycerides are elevated at 164. Patient now presents for 6 month follow up. Patient's claudication symptoms remain stable. He denies open wounds or sores on his lower extremities.  Dyspnea remains stable.  Denies chest pain, orthopnea, PND.  He continues to tolerate anticoagulation without bleeding diathesis.  Patient midst to difficulty with dietary compliance including high sodium intake.  Reports A1c in April 2023 was 8.1%.  Notably he is following up with PCP in 2 weeks.  Past Medical History:  Diagnosis Date   Anxiety    Arthritis    back, wrists elbows knees   Chronic kidney disease    stage 3   Complication of anesthesia    narrow airway   Depression    Diabetes mellitus without complication (HCC)    Dysrhythmia    Afib   Family history of adverse reaction to anesthesia    slow to wake up   Hypertension    Neuromuscular disorder (HCC)    neuropathy in feet    Pneumonia    Sleep apnea    CPAP   Past Surgical History:  Procedure Laterality Date   BACK SURGERY     HARDWARE REMOVAL Left 10/08/2020   Procedure: HARDWARE REMOVAL;  Surgeon: Ollen Gross, MD;  Location: [facility];  Service: Orthopedics;  Laterality: Left;   HIP PINNING Bilateral [id number]   JOINT REPLACEMENT Right 2010   KNEE ARTHROSCOPY Right 2009   NASAL SEPTOPLASTY W/ TURBINOPLASTY  1997   SHOULDER ARTHROSCOPY Right 2008   TONSILLECTOMY     as a child   TOTAL HIP ARTHROPLASTY Left 10/08/2020   Procedure: TOTAL HIP ARTHROPLASTY ANTERIOR APPROACH;  Surgeon: Ollen Gross, MD;  Location: [facility];  Service: Orthopedics;  Laterality: Left;    Family History  Problem Relation Age of Onset   Colon cancer Mother    Non-Hodgkin's lymphoma Father    Colon cancer Brother     Social History   Tobacco Use   Smoking status: Former    Packs/day: 0.50    Years: 15.00    Total pack years: 7.50    Types: Cigarettes    Quit date: 2000    Years since quitting: 23.4   Smokeless tobacco: Never  Substance Use Topics   Alcohol use: Not Currently   Marital Status: Unknown  ROS  Review of Systems  Cardiovascular:  Positive for claudication (stable) and dyspnea on exertion (stable). Negative for chest pain and leg swelling.  Respiratory:  Positive for snoring (OSA on CPAP).   Musculoskeletal:  Positive for arthritis, back pain (major back surgery and now walks with cane), joint pain (left hip > right and bilateral knee) and muscle cramps.  Gastrointestinal:  Negative for melena.   Objective  Blood pressure (!) 149/65, pulse 82, temperature 98 F (36.7 C), temperature source Temporal, resp. rate 17, height 5\' 7"  (1.702 m), weight 259 lb 3.2 oz (117.6 kg), SpO2 97 %.     12/30/2021   11:12 AM 06/29/2021   10:51 AM 06/29/2021   10:46 AM  Vitals with BMI  Height 5\' 7"   5\' 7"   Weight 259 lbs 3 oz  265 lbs 3 oz  BMI [id number]  123456  Systolic 123456 123456 123456  Diastolic 65 63 71  Pulse  82 71 69     Physical Exam Vitals reviewed.  Constitutional:      Appearance: He is morbidly obese.     Comments: Morbidly obese  Neck:     Vascular: No carotid bruit or JVD.  Cardiovascular:     Rate and Rhythm: Regular rhythm.     Pulses:          Carotid pulses are 2+ on the right side and 2+ on the left side.      Femoral pulses are 1+ on the right side with bruit and 0 on the left side with bruit.      Popliteal pulses are 0 on the right side and 0 on the left side.       Dorsalis pedis pulses are 0 on the right side and 0 on the left side.       Posterior tibial pulses are 0 on the right side and 0 on the left side.     Heart sounds: Heart sounds are distant. No murmur heard.    No gallop.     Comments: Chronic venous stasis dermatitis changes noted.  Mild superficial varicose veins noted bilaterally. Pulmonary:     Effort: Pulmonary effort is normal.     Breath sounds: Normal breath sounds.  Musculoskeletal:     Cervical back: Normal range of motion.     Right lower leg: No edema.     Left lower leg: No edema.    Laboratory examination:   No results for input(s): "NA", "K", "CL", "CO2", "GLUCOSE", "BUN", "CREATININE", "CALCIUM", "GFRNONAA", "GFRAA" in the last 8760 hours.  CrCl cannot be calculated (Patient's most recent lab result is older than the maximum 21 days allowed.).     Latest Ref Rng & Units 10/10/2020    2:46 AM 10/09/2020    2:53 AM 10/01/2020   11:06 AM  CMP  Glucose 70 - 99 mg/dL 236  215  171   BUN 8 - 23 mg/dL 35  34  33   Creatinine 0.61 - 1.24 mg/dL 1.24  1.47  1.58   Sodium 135 - 145 mmol/L 136  136  138   Potassium 3.5 - 5.1 mmol/L 3.7  4.8  4.7   Chloride 98 - 111 mmol/L 101  104  102   CO2 22 - 32 mmol/L 26  25  27    Calcium 8.9 - 10.3 mg/dL 8.7  8.8  9.2   Total Protein 6.5 - 8.1 g/dL   7.2   Total Bilirubin 0.3 - 1.2 mg/dL   0.4   Alkaline Phos 38 - 126 U/L   48   AST 15 - 41 U/L   19  ALT 0 - 44 U/L   18       Latest Ref Rng &  Units 10/11/2020    3:06 AM 10/10/2020    2:46 AM 10/09/2020    2:53 AM  CBC  WBC 4.0 - 10.5 K/uL 11.0  11.1  9.3   Hemoglobin 13.0 - 17.0 g/dL 9.5  10.0  10.2   Hematocrit 39.0 - 52.0 % 29.0  30.6  30.9   Platelets 150 - 400 K/uL 141  153  143    Lipid Panel Recent Labs    07/07/21 1017  CHOL 127  TRIG 164*  LDLCALC 59  HDL 40  LDLDIRECT 60    HEMOGLOBIN A1C Lab Results  Component Value Date   HGBA1C 6.5 (H) 10/01/2020   MPG 139.85 10/01/2020   TSH No results for input(s): "TSH" in the last 8760 hours.  BNP (last 3 results) No results for input(s): "BNP" in the last 8760 hours.  ProBNP (last 3 results) Recent Labs    01/22/21 1052  PROBNP 222    External labs:   Allergies   Allergies  Allergen Reactions   Penicillins Anaphylaxis   Sulfa Antibiotics Rash   Celebrex [Celecoxib] Rash    Headache    Vioxx [Rofecoxib] Rash    Medications Prior to Visit:   Outpatient Medications Prior to Visit  Medication Sig Dispense Refill   acetaminophen (TYLENOL) 500 MG tablet Take 500 mg by mouth every 6 (six) hours as needed for headache.     Ascorbic Acid (VITAMIN C) 1000 MG tablet Take 1,000 mg by mouth daily.     docusate sodium (COLACE) 100 MG capsule Take 100-200 mg by mouth daily as needed for mild constipation.     escitalopram (LEXAPRO) 20 MG tablet Take 20 mg by mouth daily.  1   gabapentin (NEURONTIN) 100 MG capsule Take 200 mg by mouth 4 (four) times daily.     glipiZIDE (GLUCOTROL) 10 MG tablet Take 10 mg by mouth 2 (two) times daily.  4   insulin glargine (LANTUS) 100 UNIT/ML Solostar Pen Inject 40 Units into the skin at bedtime.     lidocaine (LIDODERM) 5 % Place 1 patch onto the skin daily as needed (pain).     Melatonin 10 MG TABS Take 10 mg by mouth at bedtime.     methocarbamol (ROBAXIN) 500 MG tablet Take 1 tablet (500 mg total) by mouth every 6 (six) hours as needed for muscle spasms. 40 tablet 0   metoprolol tartrate (LOPRESSOR) 25 MG tablet TAKE ONE  TABLET BY MOUTH TWICE DAILY 60 tablet 3   oxyCODONE (ROXICODONE) 15 MG immediate release tablet Take 15 mg by mouth 3 (three) times daily.  0   Polyethyl Glycol-Propyl Glycol (SYSTANE OP) Place 1 drop into both eyes 2 (two) times daily as needed (dry eyes).     Rivaroxaban (XARELTO) 15 MG TABS tablet Take 1 tablet (15 mg total) by mouth daily with supper. 90 tablet 3   Semaglutide,0.25 or 0.5MG /DOS, (OZEMPIC, 0.25 OR 0.5 MG/DOSE,) 2 MG/1.5ML SOPN Inject into the skin.     simvastatin (ZOCOR) 40 MG tablet Take 1 tablet by mouth every evening.     torsemide (DEMADEX) 20 MG tablet TAKE ONE TABLET BY MOUTH EVERY DAY AS NEEDED FOR leg swelling 90 tablet 3   trandolapril (MAVIK) 1 MG tablet Take 1 mg by mouth daily.     traZODone (DESYREL) 100 MG tablet Take 100 mg by mouth at bedtime.  vitamin B-12 (CYANOCOBALAMIN) 1000 MCG tablet Take 1,000 mcg by mouth daily.     TRULICITY 1.5 0000000 SOPN Inject 1.5 mg into the muscle every Tuesday.  10   No facility-administered medications prior to visit.   Final Medications at End of Visit    Current Meds  Medication Sig   acetaminophen (TYLENOL) 500 MG tablet Take 500 mg by mouth every 6 (six) hours as needed for headache.   Ascorbic Acid (VITAMIN C) 1000 MG tablet Take 1,000 mg by mouth daily.   docusate sodium (COLACE) 100 MG capsule Take 100-200 mg by mouth daily as needed for mild constipation.   escitalopram (LEXAPRO) 20 MG tablet Take 20 mg by mouth daily.   gabapentin (NEURONTIN) 100 MG capsule Take 200 mg by mouth 4 (four) times daily.   glipiZIDE (GLUCOTROL) 10 MG tablet Take 10 mg by mouth 2 (two) times daily.   insulin glargine (LANTUS) 100 UNIT/ML Solostar Pen Inject 40 Units into the skin at bedtime.   lidocaine (LIDODERM) 5 % Place 1 patch onto the skin daily as needed (pain).   Melatonin 10 MG TABS Take 10 mg by mouth at bedtime.   methocarbamol (ROBAXIN) 500 MG tablet Take 1 tablet (500 mg total) by mouth every 6 (six) hours as needed  for muscle spasms.   metoprolol tartrate (LOPRESSOR) 25 MG tablet TAKE ONE TABLET BY MOUTH TWICE DAILY   oxyCODONE (ROXICODONE) 15 MG immediate release tablet Take 15 mg by mouth 3 (three) times daily.   Polyethyl Glycol-Propyl Glycol (SYSTANE OP) Place 1 drop into both eyes 2 (two) times daily as needed (dry eyes).   Rivaroxaban (XARELTO) 15 MG TABS tablet Take 1 tablet (15 mg total) by mouth daily with supper.   Semaglutide,0.25 or 0.5MG /DOS, (OZEMPIC, 0.25 OR 0.5 MG/DOSE,) 2 MG/1.5ML SOPN Inject into the skin.   simvastatin (ZOCOR) 40 MG tablet Take 1 tablet by mouth every evening.   torsemide (DEMADEX) 20 MG tablet TAKE ONE TABLET BY MOUTH EVERY DAY AS NEEDED FOR leg swelling   trandolapril (MAVIK) 1 MG tablet Take 1 mg by mouth daily.   traZODone (DESYREL) 100 MG tablet Take 100 mg by mouth at bedtime.   vitamin B-12 (CYANOCOBALAMIN) 1000 MCG tablet Take 1,000 mcg by mouth daily.   Radiology:   No results found.  Cardiac Studies:   PCV MYOCARDIAL PERFUSION WITH LEXISCAN 08/25/2020 Lexiscan nuclear stress test performed using 1-day protocol. Stress EKG is non-diagnostic, as this is pharmacological stress test. In addition, rest and stress EKG showed atrial fibrillation with controlled ventricular rate. Normal myocardial perfusion. Stress LVEF 72%. Low risk study.  PCV ECHOCARDIOGRAM COMPLETE 0000000 Normal LV systolic function with visual EF 55-60%. Left ventricle cavity is normal in size. Mild left ventricular hypertrophy. Normal global wall motion. Indeterminate diastolic filling pattern, elevated LAP. No significant valvular heart disease. No prior study for comparison.   Lower Extremity Arterial Duplex 09/04/2020:  Significant elevation of the peak systolic velocity is seen in the EIA and CFA suggestive of >50% stenosis. There is marked heterogeneous plaque noted. The right PT appears occluded. Remaining right lower extremity does not suggest a hemodynamically significant  stenosis.  No significant elevation of the peak systolic velocity is seen in the left lower extremity to suggest a hemodynamically significant stenosis.  This exam reveals mildly decreased perfusion of the right lower extremity, noted at the anterior tibial artery level (ABI 0.86) and mildly decreased perfusion of the left lower extremity, noted at the anterior tibal and post tibial  artery level (ABI 0.86).   Carotid artery duplex 11/21/2020: Stenosis in the right internal carotid artery (16-49%). Homogeneous plaque noted. Stenosis in the right external carotid artery (<50%). Stenosis in the left internal carotid artery (16-49%). Stenosis in the left external carotid artery (<50%). Heterogeneous plaque noted. Antegrade right vertebral artery flow. Antegrade left vertebral artery flow. Follow up in one year is appropriate if clinically indicated.  EKG:  12/30/2021: Sinus rhythm with first-degree AV block and occasional PACs at a rate of 70 bpm.  Left atrial enlargement.  Incomplete right bundle branch block.  Nonspecific T abnormality unchanged compared to previous EKG.  No significant difference from EKG 06/29/2021.  EKG 09/12/2020: Atrial fibrillation with controlled ventricular response at rate of 64 bpm, left axis deviation, left anterior fascicular block.  Incomplete right bundle branch block.  Low-voltage complexes.  Normal QT interval.   Assessment     ICD-10-CM   1. Paroxysmal atrial fibrillation (HCC)  I48.0 EKG 12-Lead    2. Essential hypertension  I10     3. Chronic diastolic (congestive) heart failure (HCC)  I50.32     4. Asymptomatic bilateral carotid artery stenosis  I65.23 PCV CAROTID DUPLEX (BILATERAL)      CHA2DS2-VASc Score is 4.  Yearly risk of stroke: 4.8% (A, DM, HTN, Vasc Dz).  Score of 1=0.6; 2=2.2; 3=3.2; 4=4.8; 5=7.2; 6=9.8; 7=>9.8) -(CHF; HTN; vasc disease DM,  Male = 1; Age <65 =0; 65-74 = 1,  >75 =2; stroke/embolism= 2).    Medications Discontinued During  This Encounter  Medication Reason   TRULICITY 1.5 0000000 SOPN     No orders of the defined types were placed in this encounter.  Orders Placed This Encounter  Procedures   EKG 12-Lead   Recommendations:   Mauricio Dufrene is a 71 y.o. Caucasian male with degenerative joint disease, chronic back pain with extensive lumbar surgery in the remote past, diabetes mellitus with chronic stage III kidney disease with hyperkalemia and follows nephrology,  OSA on CPAP and peripheral neuropathy, found to have new onset atrial fibrillation during preoperative EKG in February 2022.   He also has severe peripheral arterial disease by physical exam especially involving right hip and right leg worse than the left.  He also has pseudoclaudication from spinal stenosis.     Patient was last seen in the office 06/29/2021 by Dr. Einar Gip at which time patient was stable from a cardiovascular standpoint so no changes were made.  Repeat lipid profile testing since last office visit shows LDL is well controlled, however triglycerides are elevated at 164. Patient now presents for 6 month follow up.  Patient remains stable without life-threatening ischemia and stable claudication.  Blood pressure is elevated, likely due to high sodium intake in his diet.  Patient wishes to first make dietary and lifestyle modifications including focusing on weight loss.  Patient will follow-up with PCP in 2 weeks for reevaluation of hypertension.  We will obtain repeat carotid artery duplex for surveillance of carotid artery stenosis.  He continues to tolerate anticoagulation without bleeding diathesis, will continue Xarelto.  Patient is present maintaining normal sinus rhythm.  There is no clinical evidence of heart failure.  Will not make changes to medications at this time.  Follow-up in 6 months, sooner if needed.   Alethia Berthold, PA-C 12/30/2021, 11:52 AM Office: [phone]

## 2022-01-05 ENCOUNTER — Other Ambulatory Visit: Payer: Self-pay | Admitting: *Deleted

## 2022-01-05 DIAGNOSIS — N183 Chronic kidney disease, stage 3 unspecified: Secondary | ICD-10-CM | POA: Diagnosis not present

## 2022-01-05 NOTE — Patient Outreach (Signed)
[facility]) Care Management  01/05/2022  Merril Nagy 08-30-1950 [id number]   [facility] Care coordination- referral to external pharmacy/CRN vendor  While speaking with his wife, Marcelino Duster, Mr Canela reported  paying on 01/05/22 $143 for a 30-day supply of Xarelto at [facility] He is not able to afford this This had to be charged to a credit card He has not been on coumadin nor Eliquis His cardiologist is Dr Jacinto Halim   Plan [facility] RN CM referred him to external pharmacy/CRN vendor via e-mail   Cala Bradford L. Noelle Penner, RN, BSN, CCM [facility] Telephonic Care Management Care Coordinator Office number [phone]

## 2022-01-07 ENCOUNTER — Encounter: Payer: Self-pay | Admitting: *Deleted

## 2022-01-08 DIAGNOSIS — G8929 Other chronic pain: Secondary | ICD-10-CM | POA: Diagnosis not present

## 2022-01-08 DIAGNOSIS — E1142 Type 2 diabetes mellitus with diabetic polyneuropathy: Secondary | ICD-10-CM | POA: Diagnosis not present

## 2022-01-08 DIAGNOSIS — N183 Chronic kidney disease, stage 3 unspecified: Secondary | ICD-10-CM | POA: Diagnosis not present

## 2022-01-08 DIAGNOSIS — E782 Mixed hyperlipidemia: Secondary | ICD-10-CM | POA: Diagnosis not present

## 2022-01-11 ENCOUNTER — Other Ambulatory Visit: Payer: Self-pay | Admitting: Cardiology

## 2022-01-11 DIAGNOSIS — I4719 Other supraventricular tachycardia: Secondary | ICD-10-CM

## 2022-01-11 DIAGNOSIS — I471 Supraventricular tachycardia: Secondary | ICD-10-CM

## 2022-01-20 ENCOUNTER — Ambulatory Visit: Payer: Medicare Other

## 2022-01-20 DIAGNOSIS — I6523 Occlusion and stenosis of bilateral carotid arteries: Secondary | ICD-10-CM

## 2022-01-22 DIAGNOSIS — H6123 Impacted cerumen, bilateral: Secondary | ICD-10-CM | POA: Diagnosis not present

## 2022-01-22 DIAGNOSIS — G894 Chronic pain syndrome: Secondary | ICD-10-CM | POA: Diagnosis not present

## 2022-01-22 DIAGNOSIS — E782 Mixed hyperlipidemia: Secondary | ICD-10-CM | POA: Diagnosis not present

## 2022-01-22 DIAGNOSIS — E1142 Type 2 diabetes mellitus with diabetic polyneuropathy: Secondary | ICD-10-CM | POA: Diagnosis not present

## 2022-02-02 DIAGNOSIS — E1142 Type 2 diabetes mellitus with diabetic polyneuropathy: Secondary | ICD-10-CM | POA: Diagnosis not present

## 2022-02-10 ENCOUNTER — Other Ambulatory Visit: Payer: Self-pay

## 2022-02-10 MED ORDER — RIVAROXABAN 15 MG PO TABS: 15.0000 mg | ORAL_TABLET | Freq: Every day | ORAL | 3 refills | Status: DC

## 2022-02-15 DIAGNOSIS — I129 Hypertensive chronic kidney disease with stage 1 through stage 4 chronic kidney disease, or unspecified chronic kidney disease: Secondary | ICD-10-CM | POA: Diagnosis not present

## 2022-02-15 DIAGNOSIS — N183 Chronic kidney disease, stage 3 unspecified: Secondary | ICD-10-CM | POA: Diagnosis not present

## 2022-02-17 DIAGNOSIS — E559 Vitamin D deficiency, unspecified: Secondary | ICD-10-CM | POA: Diagnosis not present

## 2022-02-17 DIAGNOSIS — M898X9 Other specified disorders of bone, unspecified site: Secondary | ICD-10-CM | POA: Diagnosis not present

## 2022-02-17 DIAGNOSIS — D631 Anemia in chronic kidney disease: Secondary | ICD-10-CM | POA: Diagnosis not present

## 2022-02-17 DIAGNOSIS — I129 Hypertensive chronic kidney disease with stage 1 through stage 4 chronic kidney disease, or unspecified chronic kidney disease: Secondary | ICD-10-CM | POA: Diagnosis not present

## 2022-02-17 DIAGNOSIS — Z794 Long term (current) use of insulin: Secondary | ICD-10-CM | POA: Diagnosis not present

## 2022-02-17 DIAGNOSIS — N183 Chronic kidney disease, stage 3 unspecified: Secondary | ICD-10-CM | POA: Diagnosis not present

## 2022-02-17 DIAGNOSIS — R801 Persistent proteinuria, unspecified: Secondary | ICD-10-CM | POA: Diagnosis not present

## 2022-02-17 DIAGNOSIS — N1831 Chronic kidney disease, stage 3a: Secondary | ICD-10-CM | POA: Diagnosis not present

## 2022-02-17 DIAGNOSIS — E1122 Type 2 diabetes mellitus with diabetic chronic kidney disease: Secondary | ICD-10-CM | POA: Diagnosis not present

## 2022-02-23 DIAGNOSIS — E782 Mixed hyperlipidemia: Secondary | ICD-10-CM | POA: Diagnosis not present

## 2022-02-23 DIAGNOSIS — I4891 Unspecified atrial fibrillation: Secondary | ICD-10-CM | POA: Diagnosis not present

## 2022-02-23 DIAGNOSIS — Z23 Encounter for immunization: Secondary | ICD-10-CM | POA: Diagnosis not present

## 2022-02-23 DIAGNOSIS — Z86010 Personal history of colonic polyps: Secondary | ICD-10-CM | POA: Diagnosis not present

## 2022-02-23 DIAGNOSIS — Z6841 Body Mass Index (BMI) 40.0 and over, adult: Secondary | ICD-10-CM | POA: Diagnosis not present

## 2022-02-23 DIAGNOSIS — G4733 Obstructive sleep apnea (adult) (pediatric): Secondary | ICD-10-CM | POA: Diagnosis not present

## 2022-02-23 DIAGNOSIS — E1142 Type 2 diabetes mellitus with diabetic polyneuropathy: Secondary | ICD-10-CM | POA: Diagnosis not present

## 2022-02-23 DIAGNOSIS — N183 Chronic kidney disease, stage 3 unspecified: Secondary | ICD-10-CM | POA: Diagnosis not present

## 2022-03-24 IMAGING — RF DG HIP (WITH PELVIS) OPERATIVE*L*
1 series · 4 of 4 positions shown · non-contrast
Comparison: None

FLUOROSCOPY TIME:  0 minutes 10 seconds

CLINICAL DATA: LEFT hip replacement

EXAM:
OPERATIVE LEFT HIP (WITH PELVIS IF PERFORMED) for VIEWS
TECHNIQUE: Fluoroscopic spot image(s) were submitted for interpretation
post-operatively.

[Series 1: unknown protocol · 0.20mm/px · 4 of 4 slices shown]
[im 1/4]
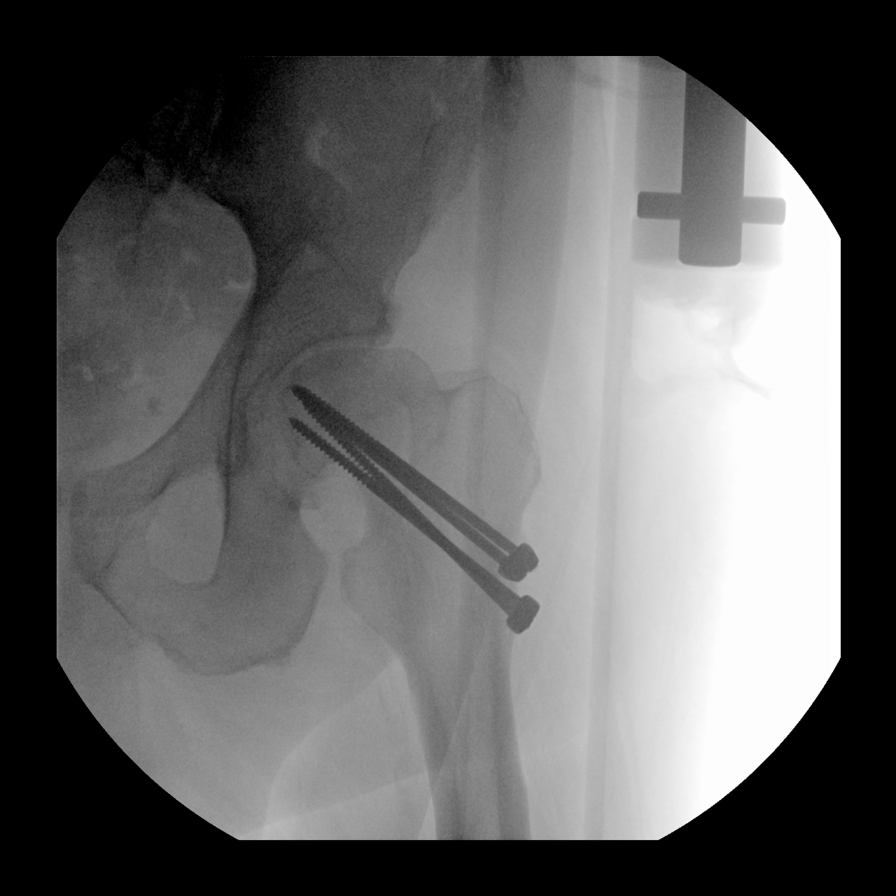
[im 2/4]
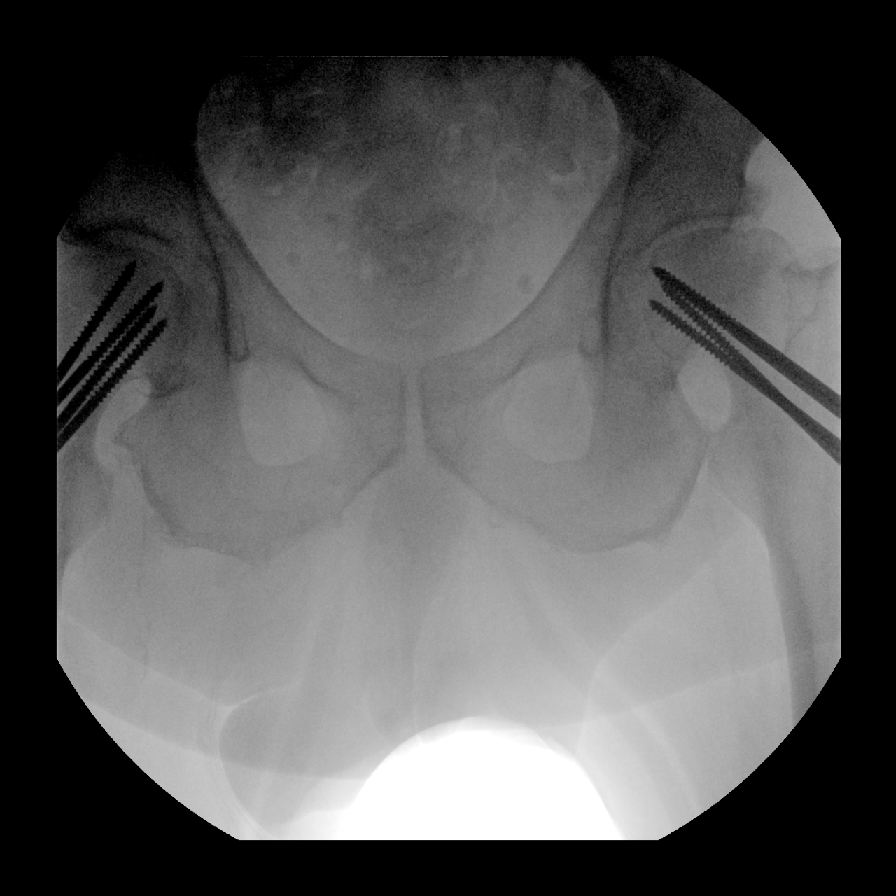
[im 3/4]
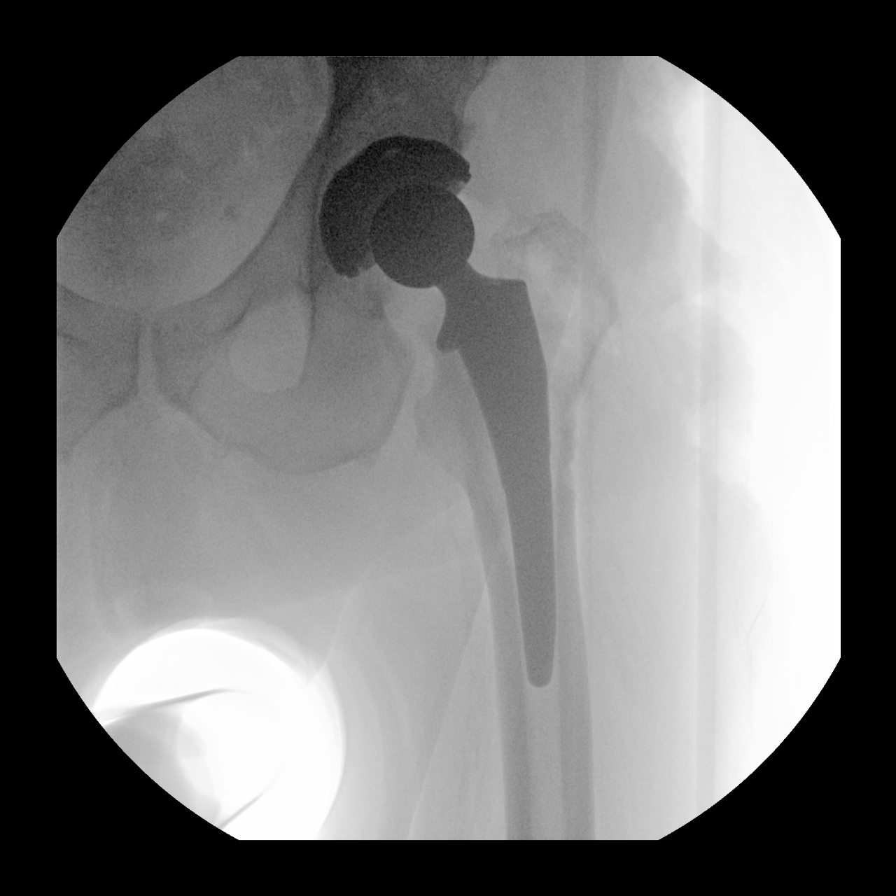
[im 4/4]
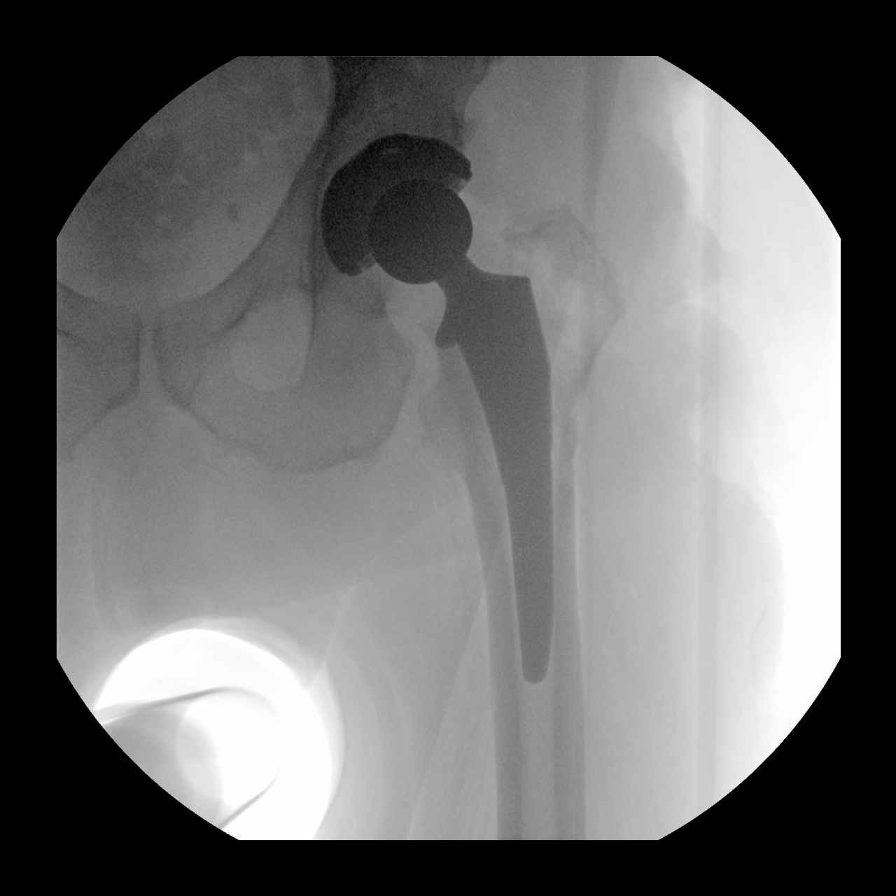

[4 of 4 positions shown; findings below may reference images not displayed]

FINDINGS: Removal of 3 cannulated screws from proximal LEFT femur.

Resection of LEFT femoral head and neck with placement of a LEFT hip
prosthesis.

No fracture or dislocation.

Bones appear demineralized.
IMPRESSION: LEFT hip replacement without acute abnormalities.

## 2022-03-24 IMAGING — DX DG PORTABLE PELVIS
1 series · 1 of 1 positions shown · non-contrast
Comparison: None.

CLINICAL DATA: Status post left hip replacement.

EXAM:
PORTABLE PELVIS 1-2 VIEWS

[pelvis ap]
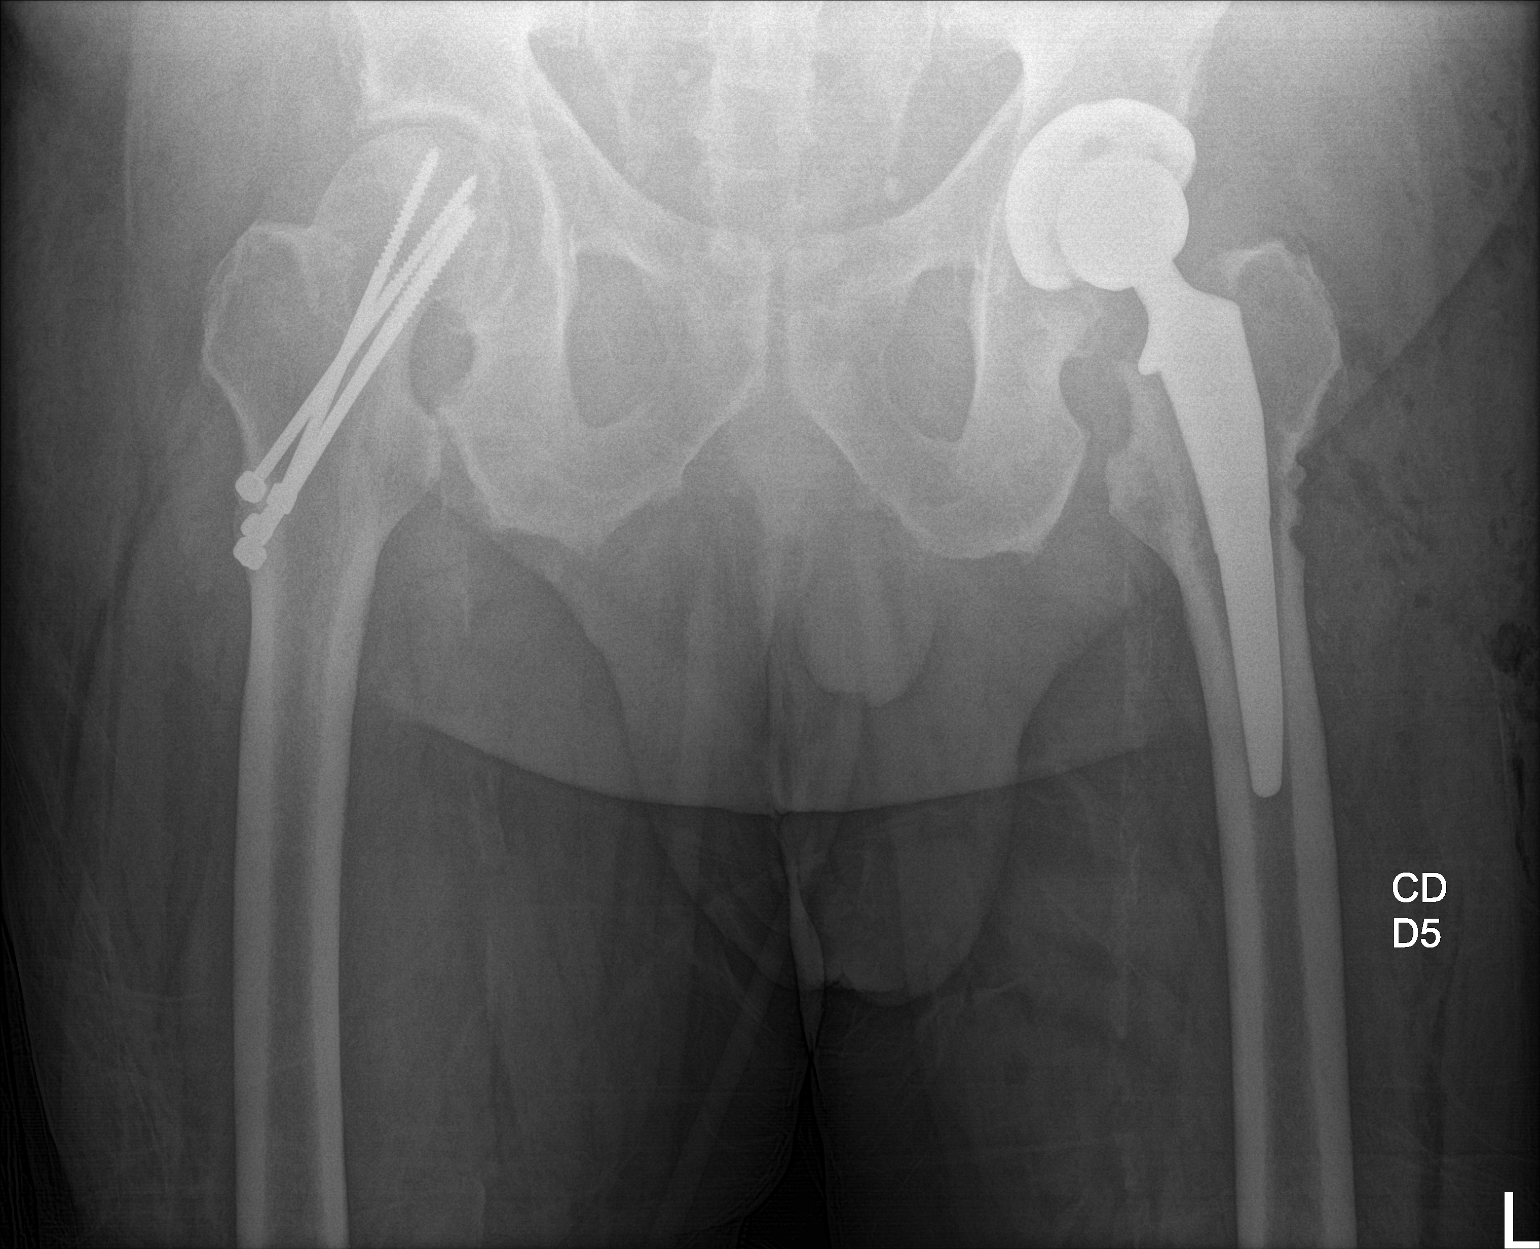

[1 of 1 positions shown; findings below may reference images not displayed]

FINDINGS: The left femoral and acetabular components are well situated.
Expected postoperative changes are seen in the surrounding soft
tissues.
IMPRESSION: Status post left total hip arthroplasty.

## 2022-05-01 ENCOUNTER — Other Ambulatory Visit: Payer: Self-pay | Admitting: Cardiology

## 2022-05-01 DIAGNOSIS — I4719 Other supraventricular tachycardia: Secondary | ICD-10-CM

## 2022-05-03 DIAGNOSIS — E782 Mixed hyperlipidemia: Secondary | ICD-10-CM | POA: Diagnosis not present

## 2022-05-03 DIAGNOSIS — G8929 Other chronic pain: Secondary | ICD-10-CM | POA: Diagnosis not present

## 2022-05-03 DIAGNOSIS — N183 Chronic kidney disease, stage 3 unspecified: Secondary | ICD-10-CM | POA: Diagnosis not present

## 2022-05-03 DIAGNOSIS — E1142 Type 2 diabetes mellitus with diabetic polyneuropathy: Secondary | ICD-10-CM | POA: Diagnosis not present

## 2022-05-03 DIAGNOSIS — I4891 Unspecified atrial fibrillation: Secondary | ICD-10-CM | POA: Diagnosis not present

## 2022-05-04 DIAGNOSIS — H2512 Age-related nuclear cataract, left eye: Secondary | ICD-10-CM | POA: Diagnosis not present

## 2022-05-04 DIAGNOSIS — H524 Presbyopia: Secondary | ICD-10-CM | POA: Diagnosis not present

## 2022-05-04 DIAGNOSIS — E113292 Type 2 diabetes mellitus with mild nonproliferative diabetic retinopathy without macular edema, left eye: Secondary | ICD-10-CM | POA: Diagnosis not present

## 2022-05-04 DIAGNOSIS — H40012 Open angle with borderline findings, low risk, left eye: Secondary | ICD-10-CM | POA: Diagnosis not present

## 2022-05-06 DIAGNOSIS — G4733 Obstructive sleep apnea (adult) (pediatric): Secondary | ICD-10-CM | POA: Diagnosis not present

## 2022-05-06 DIAGNOSIS — I4891 Unspecified atrial fibrillation: Secondary | ICD-10-CM | POA: Diagnosis not present

## 2022-05-06 DIAGNOSIS — E1140 Type 2 diabetes mellitus with diabetic neuropathy, unspecified: Secondary | ICD-10-CM | POA: Diagnosis not present

## 2022-05-07 DIAGNOSIS — Z794 Long term (current) use of insulin: Secondary | ICD-10-CM | POA: Diagnosis not present

## 2022-05-07 DIAGNOSIS — I4891 Unspecified atrial fibrillation: Secondary | ICD-10-CM | POA: Diagnosis not present

## 2022-05-07 DIAGNOSIS — M5136 Other intervertebral disc degeneration, lumbar region: Secondary | ICD-10-CM | POA: Diagnosis not present

## 2022-05-07 DIAGNOSIS — E782 Mixed hyperlipidemia: Secondary | ICD-10-CM | POA: Diagnosis not present

## 2022-05-07 DIAGNOSIS — E1142 Type 2 diabetes mellitus with diabetic polyneuropathy: Secondary | ICD-10-CM | POA: Diagnosis not present

## 2022-05-07 DIAGNOSIS — G4733 Obstructive sleep apnea (adult) (pediatric): Secondary | ICD-10-CM | POA: Diagnosis not present

## 2022-05-07 DIAGNOSIS — G894 Chronic pain syndrome: Secondary | ICD-10-CM | POA: Diagnosis not present

## 2022-05-07 DIAGNOSIS — Z86010 Personal history of colonic polyps: Secondary | ICD-10-CM | POA: Diagnosis not present

## 2022-05-07 DIAGNOSIS — Z6841 Body Mass Index (BMI) 40.0 and over, adult: Secondary | ICD-10-CM | POA: Diagnosis not present

## 2022-05-07 DIAGNOSIS — N183 Chronic kidney disease, stage 3 unspecified: Secondary | ICD-10-CM | POA: Diagnosis not present

## 2022-05-20 DIAGNOSIS — G8929 Other chronic pain: Secondary | ICD-10-CM | POA: Diagnosis not present

## 2022-05-20 DIAGNOSIS — E1142 Type 2 diabetes mellitus with diabetic polyneuropathy: Secondary | ICD-10-CM | POA: Diagnosis not present

## 2022-05-20 DIAGNOSIS — N183 Chronic kidney disease, stage 3 unspecified: Secondary | ICD-10-CM | POA: Diagnosis not present

## 2022-05-20 DIAGNOSIS — E782 Mixed hyperlipidemia: Secondary | ICD-10-CM | POA: Diagnosis not present

## 2022-06-17 DIAGNOSIS — G4733 Obstructive sleep apnea (adult) (pediatric): Secondary | ICD-10-CM | POA: Diagnosis not present

## 2022-06-17 DIAGNOSIS — E1140 Type 2 diabetes mellitus with diabetic neuropathy, unspecified: Secondary | ICD-10-CM | POA: Diagnosis not present

## 2022-06-17 DIAGNOSIS — I4891 Unspecified atrial fibrillation: Secondary | ICD-10-CM | POA: Diagnosis not present

## 2022-06-24 DIAGNOSIS — G8929 Other chronic pain: Secondary | ICD-10-CM | POA: Diagnosis not present

## 2022-06-24 DIAGNOSIS — E1140 Type 2 diabetes mellitus with diabetic neuropathy, unspecified: Secondary | ICD-10-CM | POA: Diagnosis not present

## 2022-06-24 DIAGNOSIS — E782 Mixed hyperlipidemia: Secondary | ICD-10-CM | POA: Diagnosis not present

## 2022-06-24 DIAGNOSIS — N183 Chronic kidney disease, stage 3 unspecified: Secondary | ICD-10-CM | POA: Diagnosis not present

## 2022-06-30 DIAGNOSIS — E1142 Type 2 diabetes mellitus with diabetic polyneuropathy: Secondary | ICD-10-CM | POA: Diagnosis not present

## 2022-07-02 ENCOUNTER — Ambulatory Visit: Payer: Medicare Other | Admitting: Cardiology

## 2022-07-02 ENCOUNTER — Encounter: Payer: Self-pay | Admitting: Cardiology

## 2022-07-02 VITALS — BP 111/65 | HR 72 | Resp 16 | Ht 67.0 in | Wt 263.0 lb

## 2022-07-02 DIAGNOSIS — E1122 Type 2 diabetes mellitus with diabetic chronic kidney disease: Secondary | ICD-10-CM | POA: Diagnosis not present

## 2022-07-02 DIAGNOSIS — I129 Hypertensive chronic kidney disease with stage 1 through stage 4 chronic kidney disease, or unspecified chronic kidney disease: Secondary | ICD-10-CM | POA: Diagnosis not present

## 2022-07-02 DIAGNOSIS — I739 Peripheral vascular disease, unspecified: Secondary | ICD-10-CM

## 2022-07-02 DIAGNOSIS — N1831 Chronic kidney disease, stage 3a: Secondary | ICD-10-CM

## 2022-07-02 DIAGNOSIS — I480 Paroxysmal atrial fibrillation: Secondary | ICD-10-CM

## 2022-07-02 DIAGNOSIS — I10 Essential (primary) hypertension: Secondary | ICD-10-CM

## 2022-07-02 DIAGNOSIS — I6523 Occlusion and stenosis of bilateral carotid arteries: Secondary | ICD-10-CM | POA: Diagnosis not present

## 2022-07-02 NOTE — H&P (View-Only) (Signed)
 Primary Physician/Referring:  Thacker, Jdyn, MD  Patient ID: Jason Huang, male    DOB: 04/23/1951, 71 y.o.   MRN: [id number]  Chief Complaint  Patient presents with   PAD   Carotid disease        Hyperlipidemia   Follow-up    6 months   HPI:    Jason Huang  is a 71 y.o. Caucasian male with degenerative joint disease, chronic back pain with extensive lumbar surgery in the remote past, diabetes mellitus with chronic stage III kidney disease with hyperkalemia and follows nephrology,  OSA on CPAP and peripheral neuropathy, found to have new onset atrial fibrillation during preoperative EKG in February 2022.   He also has severe peripheral arterial disease by physical exam especially involving right hip and right leg worse than the left.  He also has pseudoclaudication from spinal stenosis. This is a 6-month office visit.  Symptoms of claudication has worsened since last office visit.  States that his quality of life is decreased due to frequent leg cramps and weakness in the lower extremity when doing routine activities.  He has to rest frequently.  States that because of this he is having difficulty in losing weight.  With regard to atrial fibrillation, he is tolerating anticoagulation without any bleeding diathesis.  Dyspnea is remained stable.  No PND or orthopnea.  Past Medical History:  Diagnosis Date   Anxiety    Arthritis    back, wrists elbows knees   Chronic kidney disease    stage 3   Complication of anesthesia    narrow airway   Depression    Diabetes mellitus without complication (HCC)    Dysrhythmia    Afib   Family history of adverse reaction to anesthesia    slow to wake up   Hypertension    Neuromuscular disorder (HCC)    neuropathy in feet   Pneumonia    Sleep apnea    CPAP   Past Surgical History:  Procedure Laterality Date   BACK SURGERY     HARDWARE REMOVAL Left 10/08/2020   Procedure: HARDWARE REMOVAL;  Surgeon: Aluisio, Frank, MD;   Location: [facility];  Service: Orthopedics;  Laterality: Left;   HIP PINNING Bilateral 1965,1966   JOINT REPLACEMENT Right 2010   KNEE ARTHROSCOPY Right 2009   NASAL SEPTOPLASTY W/ TURBINOPLASTY  1997   SHOULDER ARTHROSCOPY Right 2008   TONSILLECTOMY     as a child   TOTAL HIP ARTHROPLASTY Left 10/08/2020   Procedure: TOTAL HIP ARTHROPLASTY ANTERIOR APPROACH;  Surgeon: Aluisio, Frank, MD;  Location: [facility];  Service: Orthopedics;  Laterality: Left;  100min   Family History  Problem Relation Age of Onset   Colon cancer Mother    Non-Hodgkin's lymphoma Father    Colon cancer Brother     Social History   Tobacco Use   Smoking status: Former    Packs/day: 0.50    Years: 15.00    Total pack years: 7.50    Types: Cigarettes    Quit date: 2000    Years since quitting: 23.9   Smokeless tobacco: Never  Substance Use Topics   Alcohol use: Not Currently   Marital Status: Unknown  ROS  Review of Systems  Cardiovascular:  Positive for claudication and dyspnea on exertion. Negative for chest pain and leg swelling.  Respiratory:  Positive for snoring (OSA on CPAP).   Musculoskeletal:  Positive for arthritis, back pain (major back surgery and now walks with cane), joint pain (left hip >   right and bilateral knee) and muscle cramps.  Gastrointestinal:  Negative for melena.   Objective  Blood pressure 111/65, pulse 72, resp. rate 16, height 5' 7" (1.702 m), weight 263 lb (119.3 kg), SpO2 91 %.     07/02/2022   11:11 AM 12/30/2021   11:12 AM 06/29/2021   10:51 AM  Vitals with BMI  Height 5' 7" 5' 7"   Weight 263 lbs 259 lbs 3 oz   BMI 41.18 40.59   Systolic 111 149 148  Diastolic 65 65 63  Pulse 72 82 71     Physical Exam Constitutional:      Appearance: He is morbidly obese.     Comments: Morbidly obese  Neck:     Vascular: No carotid bruit or JVD.  Cardiovascular:     Rate and Rhythm: Regular rhythm.     Pulses:          Carotid pulses are 2+ on the right side and 2+ on the  left side.      Femoral pulses are 1+ on the right side with bruit and 0 on the left side with bruit.      Popliteal pulses are 0 on the right side and 0 on the left side.       Dorsalis pedis pulses are 0 on the right side and 0 on the left side.       Posterior tibial pulses are 0 on the right side and 0 on the left side.     Heart sounds: Heart sounds are distant. No murmur heard.    No gallop.     Comments: Chronic venous stasis dermatitis changes noted.  Mild superficial varicose veins noted bilaterally. Pulmonary:     Effort: Pulmonary effort is normal.     Breath sounds: Normal breath sounds.  Abdominal:     General: Abdomen is flat.     Palpations: Abdomen is soft.  Musculoskeletal:     Cervical back: Normal range of motion.     Right lower leg: No edema.     Left lower leg: No edema.    Laboratory examination:    External labs:   Cholesterol, total 125.000 m 11/04/2021 HDL 43.000 mg 11/04/2021 LDL 56.000 mg 11/04/2021 Triglycerides 150.000 m 11/04/2021  A1C 8.000 % 06/30/2022  Hemoglobin 13.200 g/d 11/04/2021 Platelets 141.000 K/ 10/11/2020  Creatinine, Serum 1.460 mg/ 01/05/2022 Potassium 5.200 mm 11/04/2021 ALT (SGPT) 18.000 U/L 11/04/2021  Labs 08/05/2020:  A1c 6.3%.  HGB 13.1   13.0-17.0 HCT 38.7   39.0-52.0 PLT 152   150-400  BUN 33   6-26 Creatinine 1.50   0.60-1.30 Sodium 143   136-145 Potassium 4.9   3.5-5.5 eGFR 41 mL  Medications and allergies   Allergies  Allergen Reactions   Penicillins Anaphylaxis   Sulfa Antibiotics Rash   Celebrex [Celecoxib] Rash    Headache    Vioxx [Rofecoxib] Rash     Current Outpatient Medications:    acetaminophen (TYLENOL) 500 MG tablet, Take 500 mg by mouth every 6 (six) hours as needed for headache., Disp: , Rfl:    Ascorbic Acid (VITAMIN C) 1000 MG tablet, Take 1,000 mg by mouth daily., Disp: , Rfl:    docusate sodium (COLACE) 100 MG capsule, Take 100-200 mg by mouth daily as needed for mild constipation.,  Disp: , Rfl:    escitalopram (LEXAPRO) 20 MG tablet, Take 20 mg by mouth daily., Disp: , Rfl: 1   gabapentin (NEURONTIN) 100 MG capsule, Take 200 mg   by mouth 4 (four) times daily., Disp: , Rfl:    glipiZIDE (GLUCOTROL) 10 MG tablet, Take 10 mg by mouth 2 (two) times daily., Disp: , Rfl: 4   insulin glargine (LANTUS) 100 UNIT/ML Solostar Pen, Inject 45 Units into the skin at bedtime., Disp: , Rfl:    lidocaine (LIDODERM) 5 %, Place 1 patch onto the skin daily as needed (pain)., Disp: , Rfl:    Melatonin 10 MG TABS, Take 10 mg by mouth at bedtime., Disp: , Rfl:    methocarbamol (ROBAXIN) 500 MG tablet, Take 1 tablet (500 mg total) by mouth every 6 (six) hours as needed for muscle spasms., Disp: 40 tablet, Rfl: 0   metoprolol tartrate (LOPRESSOR) 25 MG tablet, TAKE ONE TABLET BY MOUTH TWICE DAILY, Disp: 60 tablet, Rfl: 3   oxyCODONE (ROXICODONE) 15 MG immediate release tablet, Take 15 mg by mouth 3 (three) times daily., Disp: , Rfl: 0   Polyethyl Glycol-Propyl Glycol (SYSTANE OP), Place 1 drop into both eyes 2 (two) times daily as needed (dry eyes)., Disp: , Rfl:    Rivaroxaban (XARELTO) 15 MG TABS tablet, Take 1 tablet (15 mg total) by mouth daily with supper., Disp: 90 tablet, Rfl: 3   Semaglutide,0.25 or 0.5MG/DOS, (OZEMPIC, 0.25 OR 0.5 MG/DOSE,) 2 MG/1.5ML SOPN, Inject into the skin., Disp: , Rfl:    simvastatin (ZOCOR) 40 MG tablet, Take 1 tablet by mouth every evening., Disp: , Rfl:    torsemide (DEMADEX) 20 MG tablet, TAKE ONE TABLET BY MOUTH EVERY DAY AS NEEDED FOR leg swelling, Disp: 90 tablet, Rfl: 3   trandolapril (MAVIK) 1 MG tablet, Take 1 mg by mouth daily., Disp: , Rfl:    traZODone (DESYREL) 100 MG tablet, Take 100 mg by mouth at bedtime., Disp: , Rfl:    vitamin B-12 (CYANOCOBALAMIN) 1000 MCG tablet, Take 1,000 mcg by mouth daily., Disp: , Rfl:    Radiology:   No results found.  Cardiac Studies:   PCV MYOCARDIAL PERFUSION WITH LEXISCAN 08/25/2020 Lexiscan nuclear stress test  performed using 1-day protocol. Stress EKG is non-diagnostic, as this is pharmacological stress test. In addition, rest and stress EKG showed atrial fibrillation with controlled ventricular rate. Normal myocardial perfusion. Stress LVEF 72%. Low risk study.  PCV ECHOCARDIOGRAM COMPLETE 09/05/2020 Normal LV systolic function with visual EF 55-60%. Left ventricle cavity is normal in size. Mild left ventricular hypertrophy. Normal global wall motion. Indeterminate diastolic filling pattern, elevated LAP. No significant valvular heart disease. No prior study for comparison.   Lower Extremity Arterial Duplex 09/04/2020:  Significant elevation of the peak systolic velocity is seen in the EIA and CFA suggestive of >50% stenosis. There is marked heterogeneous plaque noted. The right PT appears occluded. Remaining right lower extremity does not suggest a hemodynamically significant stenosis.  No significant elevation of the peak systolic velocity is seen in the left lower extremity to suggest a hemodynamically significant stenosis.  This exam reveals mildly decreased perfusion of the right lower extremity, noted at the anterior tibial artery level (ABI 0.86) and mildly decreased perfusion of the left lower extremity, noted at the anterior tibal and post tibial artery level (ABI 0.86).   Carotid artery duplex 11/21/2020: Stenosis in the right internal carotid artery (16-49%). Homogeneous plaque noted. Stenosis in the right external carotid artery (<50%). Stenosis in the left internal carotid artery (16-49%). Stenosis in the left external carotid artery (<50%). Heterogeneous plaque noted. Antegrade right vertebral artery flow. Antegrade left vertebral artery flow. Follow up in one year is   appropriate if clinically indicated.  EKG:   EKG 07/02/2022: Sinus rhythm with first-degree AV block at rate of 69 beats minute, left atrial enlargement, right axis deviation, left posterior fascicular block.  Right  bundle branch block.  Trifascicular block.  Poor R wave progression, low-voltage complexes.  Pulmonary disease pattern.  Nonspecific T abnormality.  PACs (2 in bigeminal pattern.  Compared to 12/30/2021, no change.  EKG 09/12/2020: Atrial fibrillation with controlled ventricular response at rate of 64 bpm, left axis deviation, left anterior fascicular block.  Incomplete right bundle branch block.  Low-voltage complexes.  Normal QT interval.   Assessment     ICD-10-CM   1. Paroxysmal atrial fibrillation (HCC)  I48.0 CBC    Basic metabolic panel    2. Asymptomatic bilateral carotid artery stenosis  I65.23     3. PAD (peripheral artery disease) (HCC)  I73.9 EKG 12-Lead    PCV ANKLE BRACHIAL INDEX (ABI)    PCV LOWER ARTERIAL (BILATERAL)    4. Essential hypertension  I10 CMP14+EGFR    5. Type 2 diabetes mellitus with stage 3a chronic kidney disease, without long-term current use of insulin (HCC)  E11.22 VITAMIN D 25 Hydroxy (Vit-D Deficiency, Fractures)   N18.31       CHA2DS2-VASc Score is 4.  Yearly risk of stroke: 4.8% (A, DM, HTN, Vasc Dz).  Score of 1=0.6; 2=2.2; 3=3.2; 4=4.8; 5=7.2; 6=9.8; 7=>9.8) -(CHF; HTN; vasc disease DM,  Male = 1; Age <65 =0; 65-74 = 1,  >75 =2; stroke/embolism= 2).    There are no discontinued medications.   No orders of the defined types were placed in this encounter.  Orders Placed This Encounter  Procedures   CBC   Basic metabolic panel   CMP14+EGFR   VITAMIN D 25 Hydroxy (Vit-D Deficiency, Fractures)   EKG 12-Lead   Recommendations:   Jason Huang is a 71 y.o. Caucasian male with degenerative joint disease, chronic back pain with extensive lumbar surgery in the remote past, diabetes mellitus with chronic stage IIIa kidney disease with hyperkalemia and follows nephrology,  OSA on CPAP and peripheral neuropathy, found to have new onset atrial fibrillation during preoperative EKG in February 2022.   1. Paroxysmal atrial fibrillation (HCC) Patient  is presently maintaining sinus rhythm.  Is presently on anticoagulation due to high chads vascular score.  No bleeding diathesis. Labs reviewed.  2. Asymptomatic bilateral carotid artery stenosis Very mild carotid artery stenosis, will continue annual surveillance.  No TIA.  Presently on antiplatelet therapy.  3. PAD (peripheral artery disease) (HCC) Patient has severe symptoms of right leg claudication and right hip claudication worse on the left.  His ABI also correspond with worse disease in the right lower extremity than the left.  Although his symptoms may be related to spinal stenosis, degenerative joint disease, his lifestyle has become markedly reduced and limited and hence potential for improving his lifestyle if indeed he has significant PAD especially involving the iliac vessels of the proximal SFA disease may help with improving his overall quality of life.  His wife also states that it would be meaningful to proceed with peripheral arteriogram.   Will schedule for peripheral arteriogram and possible angioplasty given symptoms. Patient understands the risks, benefits, alternatives including medical therapy, CT angiography. Patient understands <1-2% risk of death, embolic complications, bleeding, infection, renal failure, urgent surgical revascularization, but not limited to these and wants to proceed.  Lower EXTR arterial duplex.  4. Essential hypertension Blood pressure is well controlled.  No changes in the   medications were done today.  I have reviewed his external labs.  This is a 40-minute office visit encounter.   Lineth Thielke, MD, FACC 07/06/2022, 9:19 PM Office: [phone] Fax: [phone] Pager: [phone]  

## 2022-07-02 NOTE — Progress Notes (Unsigned)
Primary Physician/Referring:  Aura Dials, MD  Patient ID: Jason Huang, male    DOB: May 17, 1951, 71 y.o.   MRN: [id number]  Chief Complaint  Patient presents with   PAD   Carotid disease        Hyperlipidemia   Follow-up    6 months   HPI:    [name]  is a 71 y.o. Caucasian male with degenerative joint disease, chronic back pain with extensive lumbar surgery in the remote past, diabetes mellitus with chronic stage III kidney disease with hyperkalemia and follows nephrology,  OSA on CPAP and peripheral neuropathy, found to have new onset atrial fibrillation during preoperative EKG in February 2022.   He also has severe peripheral arterial disease by physical exam especially involving right hip and right leg worse than the left.  He also has pseudoclaudication from spinal stenosis. This is a 54-monthoffice visit.  Symptoms of claudication has worsened since last office visit.  States that his quality of life is decreased due to frequent leg cramps and weakness in the lower extremity when doing routine activities.  He has to rest frequently.  States that because of this he is having difficulty in losing weight.  With regard to atrial fibrillation, he is tolerating anticoagulation without any bleeding diathesis.  Dyspnea is remained stable.  No PND or orthopnea.  Past Medical History:  Diagnosis Date   Anxiety    Arthritis    back, wrists elbows knees   Chronic kidney disease    stage 3   Complication of anesthesia    narrow airway   Depression    Diabetes mellitus without complication (HCC)    Dysrhythmia    Afib   Family history of adverse reaction to anesthesia    slow to wake up   Hypertension    Neuromuscular disorder (HCC)    neuropathy in feet   Pneumonia    Sleep apnea    CPAP   Past Surgical History:  Procedure Laterality Date   BACK SURGERY     HARDWARE REMOVAL Left 10/08/2020   Procedure: HARDWARE REMOVAL;  Surgeon: AGaynelle Arabian MD;   Location: [facility];  Service: Orthopedics;  Laterality: Left;   HIP PINNING Bilateral [id number]  JOINT REPLACEMENT Right 2010   KNEE ARTHROSCOPY Right 2009   NASAL SEPTOPLASTY W/ TURBINOPLASTY  1997   SHOULDER ARTHROSCOPY Right 2008   TONSILLECTOMY     as a child   TOTAL HIP ARTHROPLASTY Left 10/08/2020   Procedure: TOTAL HIP ARTHROPLASTY ANTERIOR APPROACH;  Surgeon: AGaynelle Arabian MD;  Location: [facility];  Service: Orthopedics;  Laterality: Left;  1072m   Family History  Problem Relation Age of Onset   Colon cancer Mother    Non-Hodgkin's lymphoma Father    Colon cancer Brother     Social History   Tobacco Use   Smoking status: Former    Packs/day: 0.50    Years: 15.00    Total pack years: 7.50    Types: Cigarettes    Quit date: 2000    Years since quitting: 23.9   Smokeless tobacco: Never  Substance Use Topics   Alcohol use: Not Currently   Marital Status: Unknown  ROS  Review of Systems  Cardiovascular:  Positive for claudication and dyspnea on exertion. Negative for chest pain and leg swelling.  Respiratory:  Positive for snoring (OSA on CPAP).   Musculoskeletal:  Positive for arthritis, back pain (major back surgery and now walks with cane), joint pain (left hip >  right and bilateral knee) and muscle cramps.  Gastrointestinal:  Negative for melena.   Objective  Blood pressure 111/65, pulse 72, resp. rate 16, height _0  (1.702 m), weight 263 lb (119.3 kg), SpO2 91 %.     07/02/2022   11:11 AM 12/30/2021   11:12 AM 06/29/2021   10:51 AM  Vitals with BMI  Height _1  _2    Weight 263 lbs 259 lbs 3 oz   BMI 77.93 90.30   Systolic 092 330 076  Diastolic 65 65 63  Pulse 72 82 71     Physical Exam Constitutional:      Appearance: He is morbidly obese.     Comments: Morbidly obese  Neck:     Vascular: No carotid bruit or JVD.  Cardiovascular:     Rate and Rhythm: Regular rhythm.     Pulses:          Carotid pulses are 2+ on the right side and 2+ on the  left side.      Femoral pulses are 1+ on the right side with bruit and 0 on the left side with bruit.      Popliteal pulses are 0 on the right side and 0 on the left side.       Dorsalis pedis pulses are 0 on the right side and 0 on the left side.       Posterior tibial pulses are 0 on the right side and 0 on the left side.     Heart sounds: Heart sounds are distant. No murmur heard.    No gallop.     Comments: Chronic venous stasis dermatitis changes noted.  Mild superficial varicose veins noted bilaterally. Pulmonary:     Effort: Pulmonary effort is normal.     Breath sounds: Normal breath sounds.  Abdominal:     General: Abdomen is flat.     Palpations: Abdomen is soft.  Musculoskeletal:     Cervical back: Normal range of motion.     Right lower leg: No edema.     Left lower leg: No edema.    Laboratory examination:    External labs:   Cholesterol, total 125.000 m 11/04/2021 HDL 43.000 mg 11/04/2021 LDL 56.000 mg 11/04/2021 Triglycerides 150.000 m 11/04/2021  A1C 8.000 % 06/30/2022  Hemoglobin 13.200 g/d 11/04/2021 Platelets 141.000 K/ 10/11/2020  Creatinine, Serum 1.460 mg/ 01/05/2022 Potassium 5.200 mm 11/04/2021 ALT (SGPT) 18.000 U/L 11/04/2021  Labs 08/05/2020:  A1c 6.3%.  HGB 13.1   13.0-17.0 HCT 38.7   39.0-52.0 PLT 152   150-400  BUN 33   6-26 Creatinine 1.50   0.60-1.30 Sodium 143   136-145 Potassium 4.9   3.5-5.5 eGFR 41 mL  Medications and allergies   Allergies  Allergen Reactions   Penicillins Anaphylaxis   Sulfa Antibiotics Rash   Celebrex [Celecoxib] Rash    Headache    Vioxx [Rofecoxib] Rash     Current Outpatient Medications:    acetaminophen (TYLENOL) 500 MG tablet, Take 500 mg by mouth every 6 (six) hours as needed for headache., Disp: , Rfl:    Ascorbic Acid (VITAMIN C) 1000 MG tablet, Take 1,000 mg by mouth daily., Disp: , Rfl:    docusate sodium (COLACE) 100 MG capsule, Take 100-200 mg by mouth daily as needed for mild constipation.,  Disp: , Rfl:    escitalopram (LEXAPRO) 20 MG tablet, Take 20 mg by mouth daily., Disp: , Rfl: 1   gabapentin (NEURONTIN) 100 MG capsule, Take 200 mg  by mouth 4 (four) times daily., Disp: , Rfl:    glipiZIDE (GLUCOTROL) 10 MG tablet, Take 10 mg by mouth 2 (two) times daily., Disp: , Rfl: 4   insulin glargine (LANTUS) 100 UNIT/ML Solostar Pen, Inject 45 Units into the skin at bedtime., Disp: , Rfl:    lidocaine (LIDODERM) 5 %, Place 1 patch onto the skin daily as needed (pain)., Disp: , Rfl:    Melatonin 10 MG TABS, Take 10 mg by mouth at bedtime., Disp: , Rfl:    methocarbamol (ROBAXIN) 500 MG tablet, Take 1 tablet (500 mg total) by mouth every 6 (six) hours as needed for muscle spasms., Disp: 40 tablet, Rfl: 0   metoprolol tartrate (LOPRESSOR) 25 MG tablet, TAKE ONE TABLET BY MOUTH TWICE DAILY, Disp: 60 tablet, Rfl: 3   oxyCODONE (ROXICODONE) 15 MG immediate release tablet, Take 15 mg by mouth 3 (three) times daily., Disp: , Rfl: 0   Polyethyl Glycol-Propyl Glycol (SYSTANE OP), Place 1 drop into both eyes 2 (two) times daily as needed (dry eyes)., Disp: , Rfl:    Rivaroxaban (XARELTO) 15 MG TABS tablet, Take 1 tablet (15 mg total) by mouth daily with supper., Disp: 90 tablet, Rfl: 3   Semaglutide,0.25 or 0.5MG/DOS, (OZEMPIC, 0.25 OR 0.5 MG/DOSE,) 2 MG/1.5ML SOPN, Inject into the skin., Disp: , Rfl:    simvastatin (ZOCOR) 40 MG tablet, Take 1 tablet by mouth every evening., Disp: , Rfl:    torsemide (DEMADEX) 20 MG tablet, TAKE ONE TABLET BY MOUTH EVERY DAY AS NEEDED FOR leg swelling, Disp: 90 tablet, Rfl: 3   trandolapril (MAVIK) 1 MG tablet, Take 1 mg by mouth daily., Disp: , Rfl:    traZODone (DESYREL) 100 MG tablet, Take 100 mg by mouth at bedtime., Disp: , Rfl:    vitamin B-12 (CYANOCOBALAMIN) 1000 MCG tablet, Take 1,000 mcg by mouth daily., Disp: , Rfl:    Radiology:   No results found.  Cardiac Studies:   PCV MYOCARDIAL PERFUSION WITH LEXISCAN 08/25/2020 Lexiscan nuclear stress test  performed using 1-day protocol. Stress EKG is non-diagnostic, as this is pharmacological stress test. In addition, rest and stress EKG showed atrial fibrillation with controlled ventricular rate. Normal myocardial perfusion. Stress LVEF 72%. Low risk study.  PCV ECHOCARDIOGRAM COMPLETE 97/08/6376 Normal LV systolic function with visual EF 55-60%. Left ventricle cavity is normal in size. Mild left ventricular hypertrophy. Normal global wall motion. Indeterminate diastolic filling pattern, elevated LAP. No significant valvular heart disease. No prior study for comparison.   Lower Extremity Arterial Duplex 09/04/2020:  Significant elevation of the peak systolic velocity is seen in the EIA and CFA suggestive of >50% stenosis. There is marked heterogeneous plaque noted. The right PT appears occluded. Remaining right lower extremity does not suggest a hemodynamically significant stenosis.  No significant elevation of the peak systolic velocity is seen in the left lower extremity to suggest a hemodynamically significant stenosis.  This exam reveals mildly decreased perfusion of the right lower extremity, noted at the anterior tibial artery level (ABI 0.86) and mildly decreased perfusion of the left lower extremity, noted at the anterior tibal and post tibial artery level (ABI 0.86).   Carotid artery duplex 11/21/2020: Stenosis in the right internal carotid artery (16-49%). Homogeneous plaque noted. Stenosis in the right external carotid artery (<50%). Stenosis in the left internal carotid artery (16-49%). Stenosis in the left external carotid artery (<50%). Heterogeneous plaque noted. Antegrade right vertebral artery flow. Antegrade left vertebral artery flow. Follow up in one year is  appropriate if clinically indicated.  EKG:   EKG 07/02/2022: Sinus rhythm with first-degree AV block at rate of 69 beats minute, left atrial enlargement, right axis deviation, left posterior fascicular block.  Right  bundle branch block.  Trifascicular block.  Poor R wave progression, low-voltage complexes.  Pulmonary disease pattern.  Nonspecific T abnormality.  PACs (2 in bigeminal pattern.  Compared to 12/30/2021, no change.  EKG 09/12/2020: Atrial fibrillation with controlled ventricular response at rate of 64 bpm, left axis deviation, left anterior fascicular block.  Incomplete right bundle branch block.  Low-voltage complexes.  Normal QT interval.   Assessment     ICD-10-CM   1. Paroxysmal atrial fibrillation (HCC)  I48.0 CBC    Basic metabolic panel    2. Asymptomatic bilateral carotid artery stenosis  I65.23     3. PAD (peripheral artery disease) (HCC)  I73.9 EKG 12-Lead    PCV ANKLE BRACHIAL INDEX (ABI)    PCV LOWER ARTERIAL (BILATERAL)    4. Essential hypertension  I10 CMP14+EGFR    5. Type 2 diabetes mellitus with stage 3a chronic kidney disease, without long-term current use of insulin (HCC)  E11.22 VITAMIN D 25 Hydroxy (Vit-D Deficiency, Fractures)   N18.31       CHA2DS2-VASc Score is 4.  Yearly risk of stroke: 4.8% (A, DM, HTN, Vasc Dz).  Score of 1=0.6; 2=2.2; 3=3.2; 4=4.8; 5=7.2; 6=9.8; 7=>9.8) -(CHF; HTN; vasc disease DM,  Male = 1; Age <65 =0; 65-74 = 1,  >75 =2; stroke/embolism= 2).    There are no discontinued medications.   No orders of the defined types were placed in this encounter.  Orders Placed This Encounter  Procedures   CBC   Basic metabolic panel   AJG81+LXBW   VITAMIN D 25 Hydroxy (Vit-D Deficiency, Fractures)   EKG 12-Lead   Recommendations:   Savir Blanke is a 71 y.o. Caucasian male with degenerative joint disease, chronic back pain with extensive lumbar surgery in the remote past, diabetes mellitus with chronic stage IIIa kidney disease with hyperkalemia and follows nephrology,  OSA on CPAP and peripheral neuropathy, found to have new onset atrial fibrillation during preoperative EKG in February 2022.   1. Paroxysmal atrial fibrillation [facility]) Patient  is presently maintaining sinus rhythm.  Is presently on anticoagulation due to high chads vascular score.  No bleeding diathesis. Labs reviewed.  2. Asymptomatic bilateral carotid artery stenosis Very mild carotid artery stenosis, will continue annual surveillance.  No TIA.  Presently on antiplatelet therapy.  3. PAD (peripheral artery disease) ([place]) Patient has severe symptoms of right leg claudication and right hip claudication worse on the left.  His ABI also correspond with worse disease in the right lower extremity than the left.  Although his symptoms may be related to spinal stenosis, degenerative joint disease, his lifestyle has become markedly reduced and limited and hence potential for improving his lifestyle if indeed he has significant PAD especially involving the iliac vessels of the proximal SFA disease may help with improving his overall quality of life.  His wife also states that it would be meaningful to proceed with peripheral arteriogram.   Will schedule for peripheral arteriogram and possible angioplasty given symptoms. Patient understands the risks, benefits, alternatives including medical therapy, CT angiography. Patient understands <1-2% risk of death, embolic complications, bleeding, infection, renal failure, urgent surgical revascularization, but not limited to these and wants to proceed.  Lower EXTR arterial duplex.  4. Essential hypertension Blood pressure is well controlled.  No changes in the  medications were done today.  I have reviewed his external labs.  This is a 40-minute office visit encounter.   Adrian Prows, MD, [facility] 07/06/2022, 9:19 PM Office: [phone] Fax: [phone] Pager: [phone]

## 2022-07-06 NOTE — Addendum Note (Signed)
Addended by: Delrae Rend on: 07/06/2022 09:19 PM   Modules accepted: Orders

## 2022-07-15 ENCOUNTER — Telehealth: Payer: Self-pay | Admitting: *Deleted

## 2022-07-15 DIAGNOSIS — I10 Essential (primary) hypertension: Secondary | ICD-10-CM | POA: Diagnosis not present

## 2022-07-15 DIAGNOSIS — E1122 Type 2 diabetes mellitus with diabetic chronic kidney disease: Secondary | ICD-10-CM | POA: Diagnosis not present

## 2022-07-15 DIAGNOSIS — I480 Paroxysmal atrial fibrillation: Secondary | ICD-10-CM | POA: Diagnosis not present

## 2022-07-15 DIAGNOSIS — N1831 Chronic kidney disease, stage 3a: Secondary | ICD-10-CM | POA: Diagnosis not present

## 2022-07-15 NOTE — Patient Outreach (Signed)
  Care Coordination   07/15/2022 Name: Jason Huang MRN: [id number] DOB: [birth year]   Care Coordination Outreach Attempts:  An unsuccessful telephone outreach was attempted today to offer the patient information about available care coordination services as a benefit of their health plan.   Follow Up Plan:  Additional outreach attempts will be made to offer the patient care coordination information and services.   Encounter Outcome:  No Answer   Care Coordination Interventions:  No, not indicated    Elliot Cousin, RN Care Management Coordinator [facility] [employer] Main Office [phone]

## 2022-07-16 LAB — CMP14+EGFR
ALT: 22 IU/L (ref 0–44)
AST: 18 IU/L (ref 0–40)
Albumin/Globulin Ratio: 1.6 (ref 1.2–2.2)
Albumin: 4.2 g/dL (ref 3.8–4.8)
Alkaline Phosphatase: 59 IU/L (ref 44–121)
BUN/Creatinine Ratio: 18 (ref 10–24)
BUN: 27 mg/dL (ref 8–27)
Bilirubin Total: 0.2 mg/dL (ref 0.0–1.2)
CO2: 23 mmol/L (ref 20–29)
Calcium: 8.9 mg/dL (ref 8.6–10.2)
Chloride: 102 mmol/L (ref 96–106)
Creatinine, Ser: 1.53 mg/dL — ABNORMAL HIGH (ref 0.76–1.27)
Globulin, Total: 2.6 g/dL (ref 1.5–4.5)
Glucose: 263 mg/dL — ABNORMAL HIGH (ref 70–99)
Potassium: 4.9 mmol/L (ref 3.5–5.2)
Sodium: 138 mmol/L (ref 134–144)
Total Protein: 6.8 g/dL (ref 6.0–8.5)
eGFR: 48 mL/min/{1.73_m2} — ABNORMAL LOW (ref >59)

## 2022-07-16 LAB — BASIC METABOLIC PANEL
BUN/Creatinine Ratio: 17 (ref 10–24)
BUN: 27 mg/dL (ref 8–27)
CO2: 23 mmol/L (ref 20–29)
Calcium: 9 mg/dL (ref 8.6–10.2)
Chloride: 102 mmol/L (ref 96–106)
Creatinine, Ser: 1.57 mg/dL — ABNORMAL HIGH (ref 0.76–1.27)
Glucose: 265 mg/dL — ABNORMAL HIGH (ref 70–99)
Potassium: 4.9 mmol/L (ref 3.5–5.2)
Sodium: 138 mmol/L (ref 134–144)
eGFR: 47 mL/min/{1.73_m2} — ABNORMAL LOW (ref >59)

## 2022-07-16 LAB — CBC
Hematocrit: 40.5 % (ref 37.5–51.0)
Hemoglobin: 13.5 g/dL (ref 13.0–17.7)
MCH: 30.6 pg (ref 26.6–33.0)
MCHC: 33.3 g/dL (ref 31.5–35.7)
MCV: 92 fL (ref 79–97)
Platelets: 148 10*3/uL — ABNORMAL LOW (ref 150–450)
RBC: 4.41 x10E6/uL (ref 4.14–5.80)
RDW: 12.3 % (ref 11.6–15.4)
WBC: 5.4 10*3/uL (ref 3.4–10.8)

## 2022-07-16 LAB — VITAMIN D 25 HYDROXY (VIT D DEFICIENCY, FRACTURES): Vit D, 25-Hydroxy: 36 ng/mL (ref 30.0–100.0)

## 2022-07-23 ENCOUNTER — Other Ambulatory Visit: Payer: Self-pay

## 2022-07-23 ENCOUNTER — Encounter (HOSPITAL_COMMUNITY)
Admission: RE | Disposition: A | Discharge status: Home / Self Care | Payer: Self-pay | Source: Home / Self Care | Attending: Cardiology

## 2022-07-23 ENCOUNTER — Ambulatory Visit (HOSPITAL_COMMUNITY)
Admission: RE | Admit: 2022-07-23 | Discharge: 2022-07-23 | Disposition: A | Discharge status: Home / Self Care | Payer: Medicare Other | Attending: Cardiology | Admitting: Cardiology

## 2022-07-23 DIAGNOSIS — Z7984 Long term (current) use of oral hypoglycemic drugs: Secondary | ICD-10-CM | POA: Diagnosis not present

## 2022-07-23 DIAGNOSIS — I480 Paroxysmal atrial fibrillation: Secondary | ICD-10-CM | POA: Diagnosis not present

## 2022-07-23 DIAGNOSIS — E1151 Type 2 diabetes mellitus with diabetic peripheral angiopathy without gangrene: Secondary | ICD-10-CM | POA: Diagnosis not present

## 2022-07-23 DIAGNOSIS — I6523 Occlusion and stenosis of bilateral carotid arteries: Secondary | ICD-10-CM | POA: Diagnosis not present

## 2022-07-23 DIAGNOSIS — Z7985 Long-term (current) use of injectable non-insulin antidiabetic drugs: Secondary | ICD-10-CM | POA: Diagnosis not present

## 2022-07-23 DIAGNOSIS — G4733 Obstructive sleep apnea (adult) (pediatric): Secondary | ICD-10-CM | POA: Diagnosis not present

## 2022-07-23 DIAGNOSIS — I129 Hypertensive chronic kidney disease with stage 1 through stage 4 chronic kidney disease, or unspecified chronic kidney disease: Secondary | ICD-10-CM | POA: Diagnosis not present

## 2022-07-23 DIAGNOSIS — M1990 Unspecified osteoarthritis, unspecified site: Secondary | ICD-10-CM | POA: Insufficient documentation

## 2022-07-23 DIAGNOSIS — G8929 Other chronic pain: Secondary | ICD-10-CM | POA: Insufficient documentation

## 2022-07-23 DIAGNOSIS — Z87891 Personal history of nicotine dependence: Secondary | ICD-10-CM | POA: Diagnosis not present

## 2022-07-23 DIAGNOSIS — R0609 Other forms of dyspnea: Secondary | ICD-10-CM | POA: Diagnosis not present

## 2022-07-23 DIAGNOSIS — Z794 Long term (current) use of insulin: Secondary | ICD-10-CM | POA: Insufficient documentation

## 2022-07-23 DIAGNOSIS — I700 Atherosclerosis of aorta: Secondary | ICD-10-CM | POA: Diagnosis not present

## 2022-07-23 DIAGNOSIS — I739 Peripheral vascular disease, unspecified: Secondary | ICD-10-CM | POA: Diagnosis not present

## 2022-07-23 DIAGNOSIS — E1122 Type 2 diabetes mellitus with diabetic chronic kidney disease: Secondary | ICD-10-CM | POA: Insufficient documentation

## 2022-07-23 DIAGNOSIS — I70211 Atherosclerosis of native arteries of extremities with intermittent claudication, right leg: Secondary | ICD-10-CM | POA: Diagnosis not present

## 2022-07-23 DIAGNOSIS — N1831 Chronic kidney disease, stage 3a: Secondary | ICD-10-CM | POA: Insufficient documentation

## 2022-07-23 HISTORY — PX: LOWER EXTREMITY ANGIOGRAPHY: CATH118251

## 2022-07-23 LAB — POCT I-STAT, CHEM 8
BUN: 26 mg/dL — ABNORMAL HIGH (ref 8–23)
BUN: 40 mg/dL — ABNORMAL HIGH (ref 8–23)
Calcium, Ion: 1.1 mmol/L — ABNORMAL LOW (ref 1.15–1.40)
Calcium, Ion: 1.2 mmol/L (ref 1.15–1.40)
Chloride: 105 mmol/L (ref 98–111)
Chloride: 106 mmol/L (ref 98–111)
Creatinine, Ser: 1.6 mg/dL — ABNORMAL HIGH (ref 0.61–1.24)
Creatinine, Ser: 1.7 mg/dL — ABNORMAL HIGH (ref 0.61–1.24)
Glucose, Bld: 265 mg/dL — ABNORMAL HIGH (ref 70–99)
Glucose, Bld: 281 mg/dL — ABNORMAL HIGH (ref 70–99)
HCT: 36 % — ABNORMAL LOW (ref 39.0–52.0)
HCT: 38 % — ABNORMAL LOW (ref 39.0–52.0)
Hemoglobin: 12.2 g/dL — ABNORMAL LOW (ref 13.0–17.0)
Hemoglobin: 12.9 g/dL — ABNORMAL LOW (ref 13.0–17.0)
Potassium: 5.1 mmol/L (ref 3.5–5.1)
Potassium: 6.2 mmol/L — ABNORMAL HIGH (ref 3.5–5.1)
Sodium: 137 mmol/L (ref 135–145)
Sodium: 139 mmol/L (ref 135–145)
TCO2: 24 mmol/L (ref 22–32)
TCO2: 25 mmol/L (ref 22–32)

## 2022-07-23 SURGERY — LOWER EXTREMITY ANGIOGRAPHY
Anesthesia: LOCAL

## 2022-07-23 MED ORDER — SODIUM CHLORIDE 0.9 % IV SOLN: 250.0000 mL | INTRAVENOUS | Status: DC | PRN

## 2022-07-23 MED ORDER — SODIUM CHLORIDE 0.9% FLUSH: 3.0000 mL | INTRAVENOUS | Status: DC | PRN

## 2022-07-23 MED ORDER — SODIUM CHLORIDE 0.9% FLUSH: 3.0000 mL | Freq: Two times a day (BID) | INTRAVENOUS | Status: DC

## 2022-07-23 MED ORDER — LIDOCAINE HCL (PF) 1 % IJ SOLN
INTRAMUSCULAR | Status: DC | PRN
  Administered 2022-07-23: 15 mL

## 2022-07-23 MED ORDER — ACETAMINOPHEN 325 MG PO TABS: 650.0000 mg | ORAL_TABLET | ORAL | Status: DC | PRN

## 2022-07-23 MED ORDER — LIDOCAINE HCL (PF) 1 % IJ SOLN
INTRAMUSCULAR | Status: AC
  Filled 2022-07-23: qty 30

## 2022-07-23 MED ORDER — IODIXANOL 320 MG/ML IV SOLN
INTRAVENOUS | Status: DC | PRN
  Administered 2022-07-23: 30 mL via INTRA_ARTERIAL

## 2022-07-23 MED ORDER — SODIUM CHLORIDE 0.9 % IV BOLUS
500.0000 mL | Freq: Once | INTRAVENOUS | Status: AC
  Administered 2022-07-23: 500 mL via INTRAVENOUS

## 2022-07-23 MED ORDER — FENTANYL CITRATE (PF) 100 MCG/2ML IJ SOLN
INTRAMUSCULAR | Status: AC
  Filled 2022-07-23: qty 2

## 2022-07-23 MED ORDER — RIVAROXABAN 15 MG PO TABS: 15.0000 mg | ORAL_TABLET | Freq: Every day | ORAL | 3 refills | Status: DC

## 2022-07-23 MED ORDER — HEPARIN (PORCINE) IN NACL 1000-0.9 UT/500ML-% IV SOLN
INTRAVENOUS | Status: AC
  Filled 2022-07-23: qty 1000

## 2022-07-23 MED ORDER — FENTANYL CITRATE (PF) 100 MCG/2ML IJ SOLN
INTRAMUSCULAR | Status: DC | PRN
  Administered 2022-07-23: 50 ug via INTRAVENOUS

## 2022-07-23 MED ORDER — MIDAZOLAM HCL 5 MG/5ML IJ SOLN
INTRAMUSCULAR | Status: AC
  Filled 2022-07-23: qty 5

## 2022-07-23 MED ORDER — HYDRALAZINE HCL 20 MG/ML IJ SOLN: 5.0000 mg | INTRAMUSCULAR | Status: DC | PRN

## 2022-07-23 MED ORDER — HEPARIN (PORCINE) IN NACL 1000-0.9 UT/500ML-% IV SOLN
INTRAVENOUS | Status: DC | PRN
  Administered 2022-07-23 (×2): 500 mL

## 2022-07-23 MED ORDER — MIDAZOLAM HCL 2 MG/2ML IJ SOLN
INTRAMUSCULAR | Status: DC | PRN
  Administered 2022-07-23: 2 mg via INTRAVENOUS

## 2022-07-23 MED ORDER — SODIUM CHLORIDE 0.9 % WEIGHT BASED INFUSION: 1.0000 mL/kg/h | INTRAVENOUS | Status: DC

## 2022-07-23 MED ORDER — ONDANSETRON HCL 4 MG/2ML IJ SOLN: 4.0000 mg | Freq: Four times a day (QID) | INTRAMUSCULAR | Status: DC | PRN

## 2022-07-23 MED ORDER — SODIUM CHLORIDE 0.9 % IV SOLN: INTRAVENOUS | Status: DC

## 2022-07-23 SURGICAL SUPPLY — 20 items
CATH CROSS OVER TEMPO 5F (CATHETERS) IMPLANT
CATH OMNI FLUSH 5F 65CM (CATHETERS) IMPLANT
CATH STRAIGHT 5FR 65CM (CATHETERS) IMPLANT
CLOSURE PERCLOSE PROSTYLE (VASCULAR PRODUCTS) IMPLANT
GUIDEWIRE ANGLED .035X150CM (WIRE) IMPLANT
KIT ANGIASSIST CO2 SYSTEM (KITS) IMPLANT
KIT MICROPUNCTURE NIT STIFF (SHEATH) IMPLANT
KIT PV (KITS) ×1 IMPLANT
PINNACLE LONG 5F 25CM (SHEATH) ×1
SHEATH INTROD PINNACLE 5F 25CM (SHEATH) IMPLANT
SHEATH PINNACLE 5F 10CM (SHEATH) IMPLANT
SHEATH PROBE COVER 6X72 (BAG) IMPLANT
STOPCOCK MORSE 400PSI 3WAY (MISCELLANEOUS) IMPLANT
SYR MEDRAD MARK 7 150ML (SYRINGE) ×1 IMPLANT
TRANSDUCER W/STOPCOCK (MISCELLANEOUS) ×1 IMPLANT
TRAY PV CATH (CUSTOM PROCEDURE TRAY) ×1 IMPLANT
TUBING CIL FLEX 10 FLL-RA (TUBING) IMPLANT
WIRE BENTSON .035X145CM (WIRE) IMPLANT
WIRE MICROINTRODUCER 60CM (WIRE) IMPLANT
WIRE ROSEN-J .035X260CM (WIRE) IMPLANT

## 2022-07-23 NOTE — Progress Notes (Signed)
Pt ambulated to and from bathroom with no new oozing noted at site

## 2022-07-23 NOTE — Interval H&P Note (Signed)
History and Physical Interval Note:  07/23/2022 12:50 PM  Jason Huang  has presented today for surgery, with the diagnosis of carotid stenois.  The various methods of treatment have been discussed with the patient and family. After consideration of risks, benefits and other options for treatment, the patient has consented to  Procedure(s): Lower Extremity Angiography (N/A) and possible peripheral angioplasty as a surgical intervention.  The patient's history has been reviewed, patient examined, no change in status, stable for surgery.  I have reviewed the patient's chart and labs.  Questions were answered to the patient's satisfaction.     Adrian Prows

## 2022-07-26 ENCOUNTER — Encounter (HOSPITAL_COMMUNITY): Payer: Self-pay | Admitting: Cardiology

## 2022-08-03 ENCOUNTER — Ambulatory Visit: Payer: Medicare Other | Admitting: Cardiology

## 2022-08-03 ENCOUNTER — Encounter: Payer: Self-pay | Admitting: Cardiology

## 2022-08-03 VITALS — BP 124/60 | HR 66 | Resp 16 | Ht 67.0 in | Wt 263.2 lb

## 2022-08-03 DIAGNOSIS — I739 Peripheral vascular disease, unspecified: Secondary | ICD-10-CM | POA: Diagnosis not present

## 2022-08-03 DIAGNOSIS — R29818 Other symptoms and signs involving the nervous system: Secondary | ICD-10-CM

## 2022-08-03 DIAGNOSIS — Z6841 Body Mass Index (BMI) 40.0 and over, adult: Secondary | ICD-10-CM | POA: Diagnosis not present

## 2022-08-03 DIAGNOSIS — I480 Paroxysmal atrial fibrillation: Secondary | ICD-10-CM | POA: Diagnosis not present

## 2022-08-03 NOTE — Progress Notes (Signed)
Primary Physician/Referring:  Aura Dials, MD  Patient ID: Jason Huang, male    DOB: [birth year], 72 y.o.   MRN: [id number]  Chief Complaint  Patient presents with   Post cath   Atrial Fibrillation   Follow-up    2 weeks   HPI:    Jason Huang  is a 72 y.o. Caucasian male with degenerative joint disease, chronic back pain with extensive lumbar surgery in the remote past, diabetes mellitus with chronic stage IIIa kidney disease with hyperkalemia and follows nephrology,  OSA on CPAP and peripheral neuropathy, found to have new onset atrial fibrillation during preoperative EKG in February 2022. Patient underwent peripheral arteriogram on 07/23/2022 and presents for follow-up.  He has not had any complications from the procedure.  States that he still has pain in his legs with activity, his wife is present.  Dyspnea is remained stable.  No bleeding diathesis or anticoagulation for atrial fibrillation.  Past Medical History:  Diagnosis Date   Anxiety    Arthritis    back, wrists elbows knees   Chronic kidney disease    stage 3   Complication of anesthesia    narrow airway   Depression    Diabetes mellitus without complication (HCC)    Dysrhythmia    Afib   Family history of adverse reaction to anesthesia    slow to wake up   Hypertension    Neuromuscular disorder (HCC)    neuropathy in feet   Pneumonia    Sleep apnea    CPAP   Past Surgical History:  Procedure Laterality Date   BACK SURGERY     HARDWARE REMOVAL Left 10/08/2020   Procedure: HARDWARE REMOVAL;  Surgeon: Gaynelle Arabian, MD;  Location: [facility];  Service: Orthopedics;  Laterality: Left;   HIP PINNING Bilateral [id number]   JOINT REPLACEMENT Right 2010   KNEE ARTHROSCOPY Right 2009   LOWER EXTREMITY ANGIOGRAPHY N/A 07/23/2022   Procedure: Lower Extremity Angiography;  Surgeon: Adrian Prows, MD;  Location: [facility];  Service: Cardiovascular;  Laterality: N/A;   NASAL SEPTOPLASTY W/ TURBINOPLASTY  1997    SHOULDER ARTHROSCOPY Right 2008   TONSILLECTOMY     as a child   TOTAL HIP ARTHROPLASTY Left 10/08/2020   Procedure: TOTAL HIP ARTHROPLASTY ANTERIOR APPROACH;  Surgeon: Gaynelle Arabian, MD;  Location: [facility];  Service: Orthopedics;  Laterality: Left;  112min   Family History  Problem Relation Age of Onset   Colon cancer Mother    Non-Hodgkin's lymphoma Father    Colon cancer Brother     Social History   Tobacco Use   Smoking status: Former    Packs/day: 0.50    Years: 15.00    Total pack years: 7.50    Types: Cigarettes    Quit date: 2000    Years since quitting: 24.0   Smokeless tobacco: Never  Substance Use Topics   Alcohol use: Not Currently   Marital Status: Unknown  ROS  Review of Systems  Cardiovascular:  Positive for claudication and dyspnea on exertion. Negative for chest pain and leg swelling.  Respiratory:  Positive for snoring (OSA on CPAP).   Musculoskeletal:  Positive for arthritis, back pain (major back surgery and now walks with cane), joint pain (left hip > right and bilateral knee) and muscle cramps.  Gastrointestinal:  Negative for melena.   Objective  Blood pressure 124/60, pulse 66, resp. rate 16, height 5\' 7"  (1.702 m), weight 263 lb 3.2 oz (119.4 kg), SpO2 95 %.  08/03/2022   11:02 AM 07/23/2022    5:02 PM 07/23/2022    4:02 PM  Vitals with BMI  Height 5\' 7"     Weight 263 lbs 3 oz    BMI [id number]    Systolic [id number]  Diastolic 60 63 88  Pulse 66 62 83     Physical Exam Constitutional:      Appearance: He is morbidly obese.     Comments: Morbidly obese  Neck:     Vascular: No carotid bruit or JVD.  Cardiovascular:     Rate and Rhythm: Regular rhythm.     Pulses:          Carotid pulses are 2+ on the right side and 2+ on the left side.      Femoral pulses are 1+ on the right side with bruit and 0 on the left side with bruit.      Popliteal pulses are 0 on the right side and 0 on the left side.       Dorsalis pedis pulses are 0 on the  right side and 0 on the left side.       Posterior tibial pulses are 0 on the right side and 0 on the left side.     Heart sounds: Heart sounds are distant. No murmur heard.    No gallop.     Comments: Chronic venous stasis dermatitis changes noted.  Mild superficial varicose veins noted bilaterally. Pulmonary:     Effort: Pulmonary effort is normal.     Breath sounds: Normal breath sounds.  Abdominal:     General: Abdomen is flat.     Palpations: Abdomen is soft.  Musculoskeletal:     Cervical back: Normal range of motion.     Right lower leg: No edema.     Left lower leg: No edema.    Laboratory examination:   Lab Results  Component Value Date   NA 139 07/23/2022   K 5.1 07/23/2022   CO2 23 07/15/2022   GLUCOSE 265 (H) 07/23/2022   BUN 26 (H) 07/23/2022   CREATININE 1.60 (H) 07/23/2022   CALCIUM 9.0 07/15/2022   EGFR 47 (L) 07/15/2022   GFRNONAA >60 10/10/2020    Lab Results  Component Value Date   WBC 5.4 07/15/2022   HGB 12.2 (L) 07/23/2022   HCT 36.0 (L) 07/23/2022   MCV 92 07/15/2022   PLT 148 (L) 07/15/2022   External labs:   Cholesterol, total 125.000 m 11/04/2021 HDL 43.000 mg 11/04/2021 LDL 56.000 mg 11/04/2021 Triglycerides 150.000 m 11/04/2021  A1C 8.000 % 06/30/2022  Hemoglobin 13.200 g/d 11/04/2021 Platelets 141.000 K/ 10/11/2020  Creatinine, Serum 1.460 mg/ 01/05/2022 Potassium 5.200 mm 11/04/2021 ALT (SGPT) 18.000 U/L 11/04/2021  Medications and allergies   Allergies  Allergen Reactions   Penicillins Anaphylaxis   Sulfa Antibiotics Rash   Celebrex [Celecoxib] Rash    Headache    Vioxx [Rofecoxib] Rash     Current Outpatient Medications:    acetaminophen (TYLENOL) 500 MG tablet, Take 500 mg by mouth every 6 (six) hours as needed for headache., Disp: , Rfl:    Ascorbic Acid (VITAMIN C) 1000 MG tablet, Take 1,000 mg by mouth daily., Disp: , Rfl:    docusate sodium (COLACE) 100 MG capsule, Take 100-200 mg by mouth daily as needed for mild  constipation., Disp: , Rfl:    escitalopram (LEXAPRO) 20 MG tablet, Take 20 mg by mouth daily., Disp: , Rfl: 1   gabapentin (NEURONTIN) 100  MG capsule, Take 200 mg by mouth 4 (four) times daily., Disp: , Rfl:    glipiZIDE (GLUCOTROL) 10 MG tablet, Take 10 mg by mouth 2 (two) times daily., Disp: , Rfl: 4   insulin glargine (LANTUS) 100 UNIT/ML Solostar Pen, Inject 45 Units into the skin 2 (two) times daily., Disp: , Rfl:    lidocaine (LIDODERM) 5 %, Place 1 patch onto the skin daily as needed (pain)., Disp: , Rfl:    Melatonin 10 MG TABS, Take 10 mg by mouth at bedtime as needed (sleep)., Disp: , Rfl:    metoprolol tartrate (LOPRESSOR) 25 MG tablet, TAKE ONE TABLET BY MOUTH TWICE DAILY, Disp: 60 tablet, Rfl: 3   Multiple Vitamins-Minerals (MULTIVITAMIN WITH MINERALS) tablet, Take 1 tablet by mouth daily., Disp: , Rfl:    omeprazole (PRILOSEC) 20 MG capsule, Take 20 mg by mouth every morning., Disp: , Rfl:    oxyCODONE (ROXICODONE) 15 MG immediate release tablet, Take 15 mg by mouth 3 (three) times daily., Disp: , Rfl: 0   Polyethyl Glycol-Propyl Glycol (SYSTANE OP), Place 1 drop into both eyes 2 (two) times daily as needed (dry eyes)., Disp: , Rfl:    Rivaroxaban (XARELTO) 15 MG TABS tablet, Take 1 tablet (15 mg total) by mouth daily with supper., Disp: 90 tablet, Rfl: 3   Semaglutide,0.25 or 0.5MG /DOS, (OZEMPIC, 0.25 OR 0.5 MG/DOSE,) 2 MG/1.5ML SOPN, Inject 0.25 mg into the skin every Tuesday., Disp: , Rfl:    simvastatin (ZOCOR) 40 MG tablet, Take 40 mg by mouth every evening., Disp: , Rfl:    torsemide (DEMADEX) 20 MG tablet, TAKE ONE TABLET BY MOUTH EVERY DAY AS NEEDED FOR leg swelling, Disp: 90 tablet, Rfl: 3   trandolapril (MAVIK) 1 MG tablet, Take 0.5 mg by mouth daily., Disp: , Rfl:    traZODone (DESYREL) 100 MG tablet, Take 100 mg by mouth at bedtime., Disp: , Rfl:    vitamin B-12 (CYANOCOBALAMIN) 1000 MCG tablet, Take 1,000 mcg by mouth daily., Disp: , Rfl:    Radiology:   No results  found.  Cardiac Studies:   PCV MYOCARDIAL PERFUSION WITH LEXISCAN 08/25/2020 Lexiscan nuclear stress test performed using 1-day protocol. Stress EKG is non-diagnostic, as this is pharmacological stress test. In addition, rest and stress EKG showed atrial fibrillation with controlled ventricular rate. Normal myocardial perfusion. Stress LVEF 72%. Low risk study.  PCV ECHOCARDIOGRAM COMPLETE 38/75/6433 Normal LV systolic function with visual EF 55-60%. Left ventricle cavity is normal in size. Mild left ventricular hypertrophy. Normal global wall motion. Indeterminate diastolic filling pattern, elevated LAP. No significant valvular heart disease. No prior study for comparison.   Lower Extremity Arterial Duplex 09/04/2020:  Significant elevation of the peak systolic velocity is seen in the EIA and CFA suggestive of >50% stenosis. There is marked heterogeneous plaque noted. The right PT appears occluded. Remaining right lower extremity does not suggest a hemodynamically significant stenosis.  No significant elevation of the peak systolic velocity is seen in the left lower extremity to suggest a hemodynamically significant stenosis.  This exam reveals mildly decreased perfusion of the right lower extremity, noted at the anterior tibial artery level (ABI 0.86) and mildly decreased perfusion of the left lower extremity, noted at the anterior tibal and post tibial artery level (ABI 0.86).   Carotid artery duplex 11/21/2020: Stenosis in the right internal carotid artery (16-49%). Homogeneous plaque noted. Stenosis in the right external carotid artery (<50%). Stenosis in the left internal carotid artery (16-49%). Stenosis in the left external carotid  artery (<50%). Heterogeneous plaque noted. Antegrade right vertebral artery flow. Antegrade left vertebral artery flow. Follow up in one year is appropriate if clinically indicated.  Abdominal aortogram, limited by femoral arteriogram, right renal  arteriogram with distal runoff  07/23/2022: Abdominal angiogram: 2 renal arteries on either side, widely patent.  No significant disease.  Mild atherosclerotic changes noted in the abdominal aorta. Aortoiliac bifurcation clinically.  Widely patent with mild disease in the right iliac ostium about 30 to 40% at most.  Pullback across the right common iliac artery and right proximal common iliac artery revealed only a 20 mmHg pressure gradient.  Right common femoral artery showed mild disease with a 20 to 30% stenosis and no gradient.  Right SFA is widely patent with mild disease. Left aortoiliac and left common femoral and left SFA are widely patent with mild disease. Below the knee vessels were not evaluated.    EKG:   EKG 07/02/2022: Sinus rhythm with first-degree AV block at rate of 69 beats minute, left atrial enlargement, right axis deviation, left posterior fascicular block.  Right bundle branch block.  Trifascicular block.  Poor R wave progression, low-voltage complexes.  Pulmonary disease pattern.  Nonspecific T abnormality.  PACs (2 in bigeminal pattern.  Compared to 12/30/2021, no change.  EKG 09/12/2020: Atrial fibrillation with controlled ventricular response at rate of 64 bpm, left axis deviation, left anterior fascicular block.  Incomplete right bundle branch block.  Low-voltage complexes.  Normal QT interval.   Assessment     ICD-10-CM   1. Peripheral artery disease (HCC)  I73.9 EKG 12-Lead    2. Neurogenic claudication  R29.818     3. Paroxysmal atrial fibrillation (HCC)  I48.0       CHA2DS2-VASc Score is 4.  Yearly risk of stroke: 4.8% (A, DM, HTN, Vasc Dz).  Score of 1=0.6; 2=2.2; 3=3.2; 4=4.8; 5=7.2; 6=9.8; 7=>9.8) -(CHF; HTN; vasc disease DM,  Male = 1; Age <65 =0; 65-74 = 1,  >75 =2; stroke/embolism= 2).    There are no discontinued medications.   No orders of the defined types were placed in this encounter.  Orders Placed This Encounter  Procedures   EKG 12-Lead    Recommendations:   Nicklous Aburto is a 72 y.o. Caucasian male with degenerative joint disease, chronic back pain with extensive lumbar surgery in the remote past, diabetes mellitus with chronic stage IIIa kidney disease with hyperkalemia and follows nephrology,  OSA on CPAP and peripheral neuropathy, found to have new onset atrial fibrillation during preoperative EKG in February 2022. Patient underwent peripheral arteriogram on 07/23/2022 and presents for follow-up.  1. Peripheral artery disease ([place]) Patient presents for follow-up of peripheral arterial disease, I discussed with the patient and reviewed the angiograms, he only has mild to moderate disease and I do not think that fixing his iliac artery will improve his symptoms.  I suspect his symptoms of leg pain is a combination of joint disease and also neurogenic claudication along with mild peripheral arterial disease contributing.  2. Neurogenic claudication I have had a long discussion the patient regarding neurogenic claudication and also primary osteoarthritis in view of his obesity.  Weight loss will certainly help.  3. Class 3 severe obesity due to excess calories with serious comorbidity and body mass index (BMI) of 40.0 to 44.9 in adult [facility]) Patient has diabetes mellitus, his current morbid obesity, presently on Ozempic.  I had a long discussion with the patient and his wife to eat slow and also to take Gap  of 15 to 20 minutes during the meal to improve satiety.  Also could consider increasing Ozempic to highest dose for diabetes and for weight loss.  Patient has an appointment for carotid artery duplex to follow-up on asymptomatic mild to moderate carotid artery stenosis, he will keep that appointment, otherwise I will see him back in 1 year.  This will be to follow-up on his atrial fibrillation, carotid stenosis and hypertension.  He has not had any complications with the procedure, left groin site is healed well.  I spent 25  minutes with the patient regarding discussions of PAD, neurogenic claudication and also obesity management.   Yates Decamp, MD, [facility] 08/03/2022, 11:23 AM Office: [phone] Fax: [phone] Pager: [phone]

## 2022-08-04 ENCOUNTER — Ambulatory Visit: Payer: Medicare Other | Admitting: Cardiology

## 2022-08-16 ENCOUNTER — Other Ambulatory Visit: Payer: Self-pay | Admitting: Cardiology

## 2022-08-16 DIAGNOSIS — I4719 Other supraventricular tachycardia: Secondary | ICD-10-CM

## 2022-08-16 DIAGNOSIS — I5033 Acute on chronic diastolic (congestive) heart failure: Secondary | ICD-10-CM

## 2022-08-20 DIAGNOSIS — Z79899 Other long term (current) drug therapy: Secondary | ICD-10-CM | POA: Diagnosis not present

## 2022-08-24 DIAGNOSIS — I4891 Unspecified atrial fibrillation: Secondary | ICD-10-CM | POA: Diagnosis not present

## 2022-08-24 DIAGNOSIS — M5136 Other intervertebral disc degeneration, lumbar region: Secondary | ICD-10-CM | POA: Diagnosis not present

## 2022-08-24 DIAGNOSIS — D631 Anemia in chronic kidney disease: Secondary | ICD-10-CM | POA: Diagnosis not present

## 2022-08-24 DIAGNOSIS — E113291 Type 2 diabetes mellitus with mild nonproliferative diabetic retinopathy without macular edema, right eye: Secondary | ICD-10-CM | POA: Diagnosis not present

## 2022-08-24 DIAGNOSIS — E1142 Type 2 diabetes mellitus with diabetic polyneuropathy: Secondary | ICD-10-CM | POA: Diagnosis not present

## 2022-08-24 DIAGNOSIS — N529 Male erectile dysfunction, unspecified: Secondary | ICD-10-CM | POA: Diagnosis not present

## 2022-08-24 DIAGNOSIS — N400 Enlarged prostate without lower urinary tract symptoms: Secondary | ICD-10-CM | POA: Diagnosis not present

## 2022-08-24 DIAGNOSIS — N183 Chronic kidney disease, stage 3 unspecified: Secondary | ICD-10-CM | POA: Diagnosis not present

## 2022-08-24 DIAGNOSIS — F418 Other specified anxiety disorders: Secondary | ICD-10-CM | POA: Diagnosis not present

## 2022-08-24 DIAGNOSIS — Z0000 Encounter for general adult medical examination without abnormal findings: Secondary | ICD-10-CM | POA: Diagnosis not present

## 2022-08-24 DIAGNOSIS — E782 Mixed hyperlipidemia: Secondary | ICD-10-CM | POA: Diagnosis not present

## 2022-08-30 DIAGNOSIS — N1831 Chronic kidney disease, stage 3a: Secondary | ICD-10-CM | POA: Diagnosis not present

## 2022-08-30 DIAGNOSIS — Z794 Long term (current) use of insulin: Secondary | ICD-10-CM | POA: Diagnosis not present

## 2022-08-30 DIAGNOSIS — N183 Chronic kidney disease, stage 3 unspecified: Secondary | ICD-10-CM | POA: Diagnosis not present

## 2022-08-30 DIAGNOSIS — E1122 Type 2 diabetes mellitus with diabetic chronic kidney disease: Secondary | ICD-10-CM | POA: Diagnosis not present

## 2022-08-30 DIAGNOSIS — I129 Hypertensive chronic kidney disease with stage 1 through stage 4 chronic kidney disease, or unspecified chronic kidney disease: Secondary | ICD-10-CM | POA: Diagnosis not present

## 2022-09-01 DIAGNOSIS — M898X9 Other specified disorders of bone, unspecified site: Secondary | ICD-10-CM | POA: Diagnosis not present

## 2022-09-01 DIAGNOSIS — Z6841 Body Mass Index (BMI) 40.0 and over, adult: Secondary | ICD-10-CM | POA: Diagnosis not present

## 2022-09-01 DIAGNOSIS — E1122 Type 2 diabetes mellitus with diabetic chronic kidney disease: Secondary | ICD-10-CM | POA: Diagnosis not present

## 2022-09-01 DIAGNOSIS — N183 Chronic kidney disease, stage 3 unspecified: Secondary | ICD-10-CM | POA: Diagnosis not present

## 2022-09-01 DIAGNOSIS — N1831 Chronic kidney disease, stage 3a: Secondary | ICD-10-CM | POA: Diagnosis not present

## 2022-09-01 DIAGNOSIS — R801 Persistent proteinuria, unspecified: Secondary | ICD-10-CM | POA: Diagnosis not present

## 2022-09-01 DIAGNOSIS — E559 Vitamin D deficiency, unspecified: Secondary | ICD-10-CM | POA: Diagnosis not present

## 2022-09-01 DIAGNOSIS — I129 Hypertensive chronic kidney disease with stage 1 through stage 4 chronic kidney disease, or unspecified chronic kidney disease: Secondary | ICD-10-CM | POA: Diagnosis not present

## 2022-09-01 DIAGNOSIS — Z794 Long term (current) use of insulin: Secondary | ICD-10-CM | POA: Diagnosis not present

## 2022-09-01 DIAGNOSIS — D631 Anemia in chronic kidney disease: Secondary | ICD-10-CM | POA: Diagnosis not present

## 2022-09-02 ENCOUNTER — Encounter (HOSPITAL_COMMUNITY): Payer: Self-pay

## 2022-09-02 ENCOUNTER — Emergency Department (HOSPITAL_COMMUNITY): Payer: Medicare Other

## 2022-09-02 ENCOUNTER — Emergency Department (HOSPITAL_COMMUNITY)
Admission: EM | Admit: 2022-09-02 | Discharge: 2022-09-02 | Disposition: A | Discharge status: Home / Self Care | Payer: Medicare Other | Attending: Emergency Medicine | Admitting: Emergency Medicine

## 2022-09-02 ENCOUNTER — Other Ambulatory Visit: Payer: Self-pay

## 2022-09-02 DIAGNOSIS — Z794 Long term (current) use of insulin: Secondary | ICD-10-CM | POA: Diagnosis not present

## 2022-09-02 DIAGNOSIS — Z7984 Long term (current) use of oral hypoglycemic drugs: Secondary | ICD-10-CM | POA: Diagnosis not present

## 2022-09-02 DIAGNOSIS — Z7901 Long term (current) use of anticoagulants: Secondary | ICD-10-CM | POA: Insufficient documentation

## 2022-09-02 DIAGNOSIS — H579 Unspecified disorder of eye and adnexa: Secondary | ICD-10-CM

## 2022-09-02 DIAGNOSIS — R94118 Abnormal results of other function studies of eye: Secondary | ICD-10-CM | POA: Diagnosis not present

## 2022-09-02 DIAGNOSIS — Z79899 Other long term (current) drug therapy: Secondary | ICD-10-CM | POA: Diagnosis not present

## 2022-09-02 DIAGNOSIS — H4710 Unspecified papilledema: Secondary | ICD-10-CM | POA: Insufficient documentation

## 2022-09-02 DIAGNOSIS — E119 Type 2 diabetes mellitus without complications: Secondary | ICD-10-CM | POA: Insufficient documentation

## 2022-09-02 DIAGNOSIS — R231 Pallor: Secondary | ICD-10-CM | POA: Insufficient documentation

## 2022-09-02 DIAGNOSIS — H538 Other visual disturbances: Secondary | ICD-10-CM | POA: Diagnosis not present

## 2022-09-02 DIAGNOSIS — I480 Paroxysmal atrial fibrillation: Secondary | ICD-10-CM | POA: Diagnosis not present

## 2022-09-02 DIAGNOSIS — R519 Headache, unspecified: Secondary | ICD-10-CM | POA: Diagnosis not present

## 2022-09-02 DIAGNOSIS — I10 Essential (primary) hypertension: Secondary | ICD-10-CM | POA: Diagnosis not present

## 2022-09-02 LAB — COMPREHENSIVE METABOLIC PANEL
ALT: 24 U/L (ref 0–44)
AST: 20 U/L (ref 15–41)
Albumin: 3.5 g/dL (ref 3.5–5.0)
Alkaline Phosphatase: 48 U/L (ref 38–126)
Anion gap: 9 (ref 5–15)
BUN: 27 mg/dL — ABNORMAL HIGH (ref 8–23)
CO2: 26 mmol/L (ref 22–32)
Calcium: 9.3 mg/dL (ref 8.9–10.3)
Chloride: 105 mmol/L (ref 98–111)
Creatinine, Ser: 1.54 mg/dL — ABNORMAL HIGH (ref 0.61–1.24)
GFR, Estimated: 48 mL/min — ABNORMAL LOW (ref >60)
Glucose, Bld: 97 mg/dL (ref 70–99)
Potassium: 4.8 mmol/L (ref 3.5–5.1)
Sodium: 140 mmol/L (ref 135–145)
Total Bilirubin: 0.3 mg/dL (ref 0.3–1.2)
Total Protein: 6.6 g/dL (ref 6.5–8.1)

## 2022-09-02 LAB — CBC WITH DIFFERENTIAL/PLATELET
Abs Immature Granulocytes: 0.02 10*3/uL (ref 0.00–0.07)
Basophils Absolute: 0 10*3/uL (ref 0.0–0.1)
Basophils Relative: 1 %
Eosinophils Absolute: 0.1 10*3/uL (ref 0.0–0.5)
Eosinophils Relative: 2 %
HCT: 38.9 % — ABNORMAL LOW (ref 39.0–52.0)
Hemoglobin: 13 g/dL (ref 13.0–17.0)
Immature Granulocytes: 0 %
Lymphocytes Relative: 26 %
Lymphs Abs: 1.5 10*3/uL (ref 0.7–4.0)
MCH: 30.9 pg (ref 26.0–34.0)
MCHC: 33.4 g/dL (ref 30.0–36.0)
MCV: 92.4 fL (ref 80.0–100.0)
Monocytes Absolute: 0.5 10*3/uL (ref 0.1–1.0)
Monocytes Relative: 9 %
Neutro Abs: 3.7 10*3/uL (ref 1.7–7.7)
Neutrophils Relative %: 62 %
Platelets: 143 10*3/uL — ABNORMAL LOW (ref 150–400)
RBC: 4.21 MIL/uL — ABNORMAL LOW (ref 4.22–5.81)
RDW: 13.3 % (ref 11.5–15.5)
WBC: 5.9 10*3/uL (ref 4.0–10.5)
nRBC: 0 % (ref 0.0–0.2)

## 2022-09-02 LAB — C-REACTIVE PROTEIN: CRP: 0.7 mg/dL (ref <1.0)

## 2022-09-02 LAB — SEDIMENTATION RATE: Sed Rate: 18 mm/hr — ABNORMAL HIGH (ref 0–16)

## 2022-09-02 MED ORDER — LORAZEPAM 1 MG PO TABS
1.0000 mg | ORAL_TABLET | Freq: Once | ORAL | Status: AC
  Administered 2022-09-02: 1 mg via ORAL
  Filled 2022-09-02 (×2): qty 1

## 2022-09-02 MED ORDER — GADOBUTROL 1 MMOL/ML IV SOLN
10.0000 mL | Freq: Once | INTRAVENOUS | Status: AC | PRN
  Administered 2022-09-02: 10 mL via INTRAVENOUS

## 2022-09-02 NOTE — ED Triage Notes (Signed)
Pt came in via POV d/t being sent by his eye dr. With a request that he get a urgent MRI. Pt reports he was told that he has papilledema to Ly eye with a concern of pallor in Rt eye. He comes with paperwork from the provider who sent him with specific scan & lab work specifications concerning what they saw during his appointment today. A/Ox4, pr denies pain.

## 2022-09-02 NOTE — Discharge Instructions (Addendum)
You were seen in the emergency department after your abnormal eye exam.  Your MRIs here showed no abnormalities within your brain or your eyes and your labs here were normal.  Is unclear the exact cause of your abnormal eye exam today but could be related to your diabetes.  You should call your eye doctor tomorrow with your results and follow-up with them as instructed for next steps.  You should return to the emergency department if you or losing vision in your eye, you have severe eye pain, you have numbness or weakness on one side the body compared to the other or if you have any other new or concerning symptoms.

## 2022-09-02 NOTE — ED Notes (Signed)
Contacted [facility], they state they will send transport. Plan to administer ativan when transport arrives for hx of claustrophobia.

## 2022-09-02 NOTE — ED Provider Triage Note (Signed)
Emergency Medicine Provider Triage Evaluation Note  Jason Huang , a 72 y.o. male  was evaluated in triage.  Pt complains of being sent by eye doctor.  He went for routine eye exam today at ophthalmologist found to have left-sided papilledema and "right eye pallor" concerning for tumor.  Patient was sent emergently over to the emergency department with lab and imaging orders.  He denies having headache, vomiting, neck pain, changes to his vision.  He states about 1 month ago he did have difficulty with night vision acutely but denies any other changes.  He denies any numbness or tingling or weakness to the face or extremities..  Review of Systems  Positive: See above Negative:   Physical Exam  BP (!) 118/55 (BP Location: Right Arm)   Pulse 84   Temp 98.2 F (36.8 C) (Oral)   Resp 18   Ht 5' 7.5" (1.715 m)   Wt 113.4 kg   SpO2 93%   BMI 38.58 kg/m  Gen:   Awake, no distress   Resp:  Normal effort  MSK:   Moves extremities without difficulty  Other:  Cranial nerves II through XII are intact.  The patient's pupils are dilated from previous ophthalmology exam.  Have imaging from ophthalmology showing papilledema in the left eye.  Medical Decision Making  Medically screening exam initiated at 1:03 PM.  Appropriate orders placed.  Jason Huang was informed that the remainder of the evaluation will be completed by another provider, this initial triage assessment does not replace that evaluation, and the importance of remaining in the ED until their evaluation is complete.     Mickie Hillier, PA-C 09/02/22 [phone]

## 2022-09-02 NOTE — ED Provider Notes (Signed)
[facility] Provider Note   CSN: [id number] Arrival date & time: 09/02/22  1209     History  Chief Complaint  Patient presents with   Papilledema     Ryanlee Babar is a 72 y.o. male.  Patient is a 72 year old male with a past medical history of hypertension, diabetes, A-fib on Xarelto presenting to the emergency department from his eye doctor with an abnormal eye exam.  He states that he was told by his eye doctor that he has papilledema and the left eye and pallor in the right eye.  They were concerned that he could have an underlying abnormality within the brain or I recommended that he come to the ER for urgent MRI.  The patient states that he had a regularly scheduled eye appointment today with no complaints.  He states that he wears reading glasses at baseline.  He denies any headaches, vision changes, numbness or weakness, nausea or vomiting.  The history is provided by the patient and the spouse.       Home Medications Prior to Admission medications   Medication Sig Start Date End Date Taking? Authorizing Provider  acetaminophen (TYLENOL) 500 MG tablet Take 500 mg by mouth every 6 (six) hours as needed for headache.    [provider]  Ascorbic Acid (VITAMIN C) 1000 MG tablet Take 1,000 mg by mouth daily.    [provider]  docusate sodium (COLACE) 100 MG capsule Take 100-200 mg by mouth daily as needed for mild constipation.    [provider]  escitalopram (LEXAPRO) 20 MG tablet Take 20 mg by mouth daily. 03/13/18   [provider]  gabapentin (NEURONTIN) 100 MG capsule Take 200 mg by mouth 4 (four) times daily. 06/30/20   [provider]  glipiZIDE (GLUCOTROL) 10 MG tablet Take 10 mg by mouth 2 (two) times daily. 03/28/18   [provider]  insulin glargine (LANTUS) 100 UNIT/ML Solostar Pen Inject 45 Units into the skin 2 (two) times daily.    [provider]   lidocaine (LIDODERM) 5 % Place 1 patch onto the skin daily as needed (pain).    [provider]  Melatonin 10 MG TABS Take 10 mg by mouth at bedtime as needed (sleep).    [provider]  metoprolol tartrate (LOPRESSOR) 25 MG tablet TAKE ONE TABLET BY MOUTH TWICE DAILY 08/16/22   Adrian Prows, MD  Multiple Vitamins-Minerals (MULTIVITAMIN WITH MINERALS) tablet Take 1 tablet by mouth daily.    [provider]  omeprazole (PRILOSEC) 20 MG capsule Take 20 mg by mouth every morning. 07/09/22   [provider]  oxyCODONE (ROXICODONE) 15 MG immediate release tablet Take 15 mg by mouth 3 (three) times daily. 03/10/18   [provider]  Polyethyl Glycol-Propyl Glycol (SYSTANE OP) Place 1 drop into both eyes 2 (two) times daily as needed (dry eyes).    [provider]  Rivaroxaban (XARELTO) 15 MG TABS tablet Take 1 tablet (15 mg total) by mouth daily with supper. 07/25/22   Adrian Prows, MD  Semaglutide,0.25 or 0.5MG/DOS, (OZEMPIC, 0.25 OR 0.5 MG/DOSE,) 2 MG/1.5ML SOPN Inject 2 mg into the skin every Tuesday.    [provider]  simvastatin (ZOCOR) 40 MG tablet Take 40 mg by mouth every evening.    [provider]  torsemide (DEMADEX) 20 MG tablet TAKE ONE TABLET BY MOUTH EVERY DAY AS NEEDED FOR LEG SWELLING 08/16/22   Adrian Prows, MD  trandolapril (  MAVIK) 1 MG tablet Take 0.5 mg by mouth daily.    [provider]  traZODone (DESYREL) 100 MG tablet Take 100 mg by mouth at bedtime. 07/19/20   [provider]  vitamin B-12 (CYANOCOBALAMIN) 1000 MCG tablet Take 1,000 mcg by mouth daily.    [provider]      Allergies    Penicillins, Sulfa antibiotics, Celebrex [celecoxib], and Vioxx [rofecoxib]    Review of Systems   Review of Systems  Physical Exam Updated Vital Signs BP 129/61   Pulse 68   Temp 97.6 F (36.4 C) (Oral)   Resp 16   Ht 5' 7.5" (1.715 m)   Wt 113.4 kg   SpO2 93%   BMI 38.58 kg/m  Physical  Exam Vitals and nursing note reviewed.  Constitutional:      General: He is not in acute distress.    Appearance: Normal appearance. He is obese.  HENT:     Head: Normocephalic and atraumatic.     Nose: Nose normal.     Mouth/Throat:     Mouth: Mucous membranes are moist.     Pharynx: Oropharynx is clear.  Eyes:     Extraocular Movements: Extraocular movements intact.     Conjunctiva/sclera: Conjunctivae normal.     Pupils: Pupils are equal, round, and reactive to light.  Cardiovascular:     Rate and Rhythm: Normal rate and regular rhythm.     Heart sounds: Normal heart sounds.  Pulmonary:     Effort: Pulmonary effort is normal.     Breath sounds: Normal breath sounds.  Abdominal:     General: Abdomen is flat.     Palpations: Abdomen is soft.     Tenderness: There is no abdominal tenderness.  Musculoskeletal:        General: Normal range of motion.     Cervical back: Normal range of motion and neck supple.  Skin:    General: Skin is warm and dry.  Neurological:     General: No focal deficit present.     Mental Status: He is alert and oriented to person, place, and time.     Cranial Nerves: No cranial nerve deficit.     Sensory: No sensory deficit.     Motor: No weakness.     Coordination: Coordination normal.     Comments: Visual fields intact bilaterally  Psychiatric:        Mood and Affect: Mood normal.        Behavior: Behavior normal.     ED Results / Procedures / Treatments   Labs (all labs ordered are listed, but only abnormal results are displayed) Labs Reviewed  COMPREHENSIVE METABOLIC PANEL - Abnormal; Notable for the following components:      Result Value   BUN 27 (*)    Creatinine, Ser 1.54 (*)    GFR, Estimated 48 (*)    All other components within normal limits  CBC WITH DIFFERENTIAL/PLATELET - Abnormal; Notable for the following components:   RBC 4.21 (*)    HCT 38.9 (*)    Platelets 143 (*)    All other components within normal limits   SEDIMENTATION RATE - Abnormal; Notable for the following components:   Sed Rate 18 (*)    All other components within normal limits  C-REACTIVE PROTEIN  CBG MONITORING, ED    EKG None  Radiology MR Brain W and Wo Contrast  Result Date: 09/02/2022 CLINICAL DATA:  Headache, intracranial hypertension features papilledema; Vision loss, known  etiology papilledema. EXAM: MRI HEAD AND ORBITS WITHOUT AND WITH CONTRAST TECHNIQUE: Multiplanar, multiecho pulse sequences of the brain and surrounding structures were obtained without and with intravenous contrast. Multiplanar, multiecho pulse sequences of the orbits and surrounding structures were obtained including fat saturation techniques, before and after intravenous contrast administration. CONTRAST:  89m GADAVIST GADOBUTROL 1 MMOL/ML IV SOLN COMPARISON:  None Available. FINDINGS: MRI HEAD FINDINGS Brain: No acute infarction, hemorrhage, hydrocephalus, extra-axial collection or mass lesion. Mild for age scattered T2/FLAIR hyperintensities in the white matter, compatible with chronic likely vascular ischemic disease. No pathologic enhancement. No partially empty sella. No evidence of dural venous sinus thrombosis or definite dural venous sinus stenosis. Vascular: Major arterial flow voids are maintained at the skull base. Skull and upper cervical spine: Normal marrow signal. Other: No mastoid effusions. MRI ORBITS FINDINGS Orbits: No traumatic or inflammatory finding. Globes, optic nerves, orbital fat, extraocular muscles, vascular structures, and lacrimal glands are normal. No widening of optic nerve sheaths or clear flattening of the posterior globes. No obvious pathologic enhancement on the motion limited postcontrast imaging. Visualized sinuses: Clear. Soft tissues: Negative. IMPRESSION: Unremarkable MRI head an MRI of the orbits.  No acute finding. Electronically Signed   By: FMargaretha SheffieldM.D.   On: 09/02/2022 19:18   MR ORBITS W WO  CONTRAST  Result Date: 09/02/2022 CLINICAL DATA:  Headache, intracranial hypertension features papilledema; Vision loss, known etiology papilledema. EXAM: MRI HEAD AND ORBITS WITHOUT AND WITH CONTRAST TECHNIQUE: Multiplanar, multiecho pulse sequences of the brain and surrounding structures were obtained without and with intravenous contrast. Multiplanar, multiecho pulse sequences of the orbits and surrounding structures were obtained including fat saturation techniques, before and after intravenous contrast administration. CONTRAST:  168mGADAVIST GADOBUTROL 1 MMOL/ML IV SOLN COMPARISON:  None Available. FINDINGS: MRI HEAD FINDINGS Brain: No acute infarction, hemorrhage, hydrocephalus, extra-axial collection or mass lesion. Mild for age scattered T2/FLAIR hyperintensities in the white matter, compatible with chronic likely vascular ischemic disease. No pathologic enhancement. No partially empty sella. No evidence of dural venous sinus thrombosis or definite dural venous sinus stenosis. Vascular: Major arterial flow voids are maintained at the skull base. Skull and upper cervical spine: Normal marrow signal. Other: No mastoid effusions. MRI ORBITS FINDINGS Orbits: No traumatic or inflammatory finding. Globes, optic nerves, orbital fat, extraocular muscles, vascular structures, and lacrimal glands are normal. No widening of optic nerve sheaths or clear flattening of the posterior globes. No obvious pathologic enhancement on the motion limited postcontrast imaging. Visualized sinuses: Clear. Soft tissues: Negative. IMPRESSION: Unremarkable MRI head an MRI of the orbits.  No acute finding. Electronically Signed   By: FrMargaretha Sheffield.D.   On: 09/02/2022 19:18    Procedures Procedures    Medications Ordered in ED Medications  LORazepam (ATIVAN) tablet 1 mg (1 mg Oral Given 09/02/22 1636)  gadobutrol (GADAVIST) 1 MMOL/ML injection 10 mL (10 mLs Intravenous Contrast Given 09/02/22 1837)  gadobutrol (GADAVIST)  1 MMOL/ML injection 10 mL (10 mLs Intravenous Contrast Given 09/02/22 1838)    ED Course/ Medical Decision Making/ A&P Clinical Course as of 09/02/22 1935  Thu Sep 02, 2022  1922 MRI brain and orbits negative. [VK]    Clinical Course User Index [VK] KiKemper DurieDO                             Medical Decision Making This patient presents to the ED with chief complaint(s) of abnormal eye exam with  pertinent past medical history of diabetes, hypertension, paroxysmal A-fib which further complicates the presenting complaint. The complaint involves an extensive differential diagnosis and also carries with it a high risk of complications and morbidity.    The differential diagnosis includes ICH, mass effect, CVA, TIA  Additional history obtained: Additional history obtained from spouse Records reviewed ophthalmology records  ED Course and Reassessment: Patient was initially evaluated by provider in triage ordered labs as recommended by his ophthalmologist including inflammatory markers with ESR and CRP.  Sed rate was with a trace elevation of 18, labs are otherwise at patient's baseline without acute abnormality.  MRI is pending at this time.  He has no focal neurologic deficits no acute complaints at this time.  Independent labs interpretation:  The following labs were independently interpreted: Within normal range  Independent visualization of imaging: - I independently visualized the following imaging with scope of interpretation limited to determining acute life threatening conditions related to emergency care: MRI brain/orbits, which revealed no acute abnormality  Consultation: - Consulted or discussed management/test interpretation w/ external professional: N/A  Consideration for admission or further workup: Patient has no emergent conditions requiring admission or further work-up at this time and is stable for discharge home with primary care and ophthalmology  follow-up  Social Determinants of health: N/A            Final Clinical Impression(s) / ED Diagnoses Final diagnoses:  Eye exam abnormal    Rx / DC Orders ED Discharge Orders     None         Kemper Durie, DO 09/02/22 1935

## 2022-09-02 NOTE — ED Notes (Signed)
MRI tech states it will be approxmately 76mns before patient can come to MRI

## 2022-09-03 DIAGNOSIS — E1140 Type 2 diabetes mellitus with diabetic neuropathy, unspecified: Secondary | ICD-10-CM | POA: Diagnosis not present

## 2022-09-03 DIAGNOSIS — N183 Chronic kidney disease, stage 3 unspecified: Secondary | ICD-10-CM | POA: Diagnosis not present

## 2022-09-03 DIAGNOSIS — E782 Mixed hyperlipidemia: Secondary | ICD-10-CM | POA: Diagnosis not present

## 2022-09-03 DIAGNOSIS — G894 Chronic pain syndrome: Secondary | ICD-10-CM | POA: Diagnosis not present

## 2022-09-03 DIAGNOSIS — G8929 Other chronic pain: Secondary | ICD-10-CM | POA: Diagnosis not present

## 2022-09-06 DIAGNOSIS — H4710 Unspecified papilledema: Secondary | ICD-10-CM | POA: Diagnosis not present

## 2022-09-13 DIAGNOSIS — H53133 Sudden visual loss, bilateral: Secondary | ICD-10-CM | POA: Diagnosis not present

## 2022-09-13 DIAGNOSIS — H4710 Unspecified papilledema: Secondary | ICD-10-CM | POA: Diagnosis not present

## 2022-09-13 DIAGNOSIS — H3561 Retinal hemorrhage, right eye: Secondary | ICD-10-CM | POA: Diagnosis not present

## 2022-09-17 DIAGNOSIS — G4733 Obstructive sleep apnea (adult) (pediatric): Secondary | ICD-10-CM | POA: Diagnosis not present

## 2022-10-01 DIAGNOSIS — H4710 Unspecified papilledema: Secondary | ICD-10-CM | POA: Diagnosis not present

## 2022-10-01 DIAGNOSIS — G4733 Obstructive sleep apnea (adult) (pediatric): Secondary | ICD-10-CM | POA: Diagnosis not present

## 2022-10-01 DIAGNOSIS — H53133 Sudden visual loss, bilateral: Secondary | ICD-10-CM | POA: Diagnosis not present

## 2022-10-12 DIAGNOSIS — H4710 Unspecified papilledema: Secondary | ICD-10-CM | POA: Diagnosis not present

## 2022-10-12 DIAGNOSIS — H47013 Ischemic optic neuropathy, bilateral: Secondary | ICD-10-CM | POA: Diagnosis not present

## 2022-10-12 DIAGNOSIS — H4720 Unspecified optic atrophy: Secondary | ICD-10-CM | POA: Diagnosis not present

## 2022-10-12 DIAGNOSIS — H53133 Sudden visual loss, bilateral: Secondary | ICD-10-CM | POA: Diagnosis not present

## 2022-10-19 DIAGNOSIS — E782 Mixed hyperlipidemia: Secondary | ICD-10-CM | POA: Diagnosis not present

## 2022-10-19 DIAGNOSIS — N400 Enlarged prostate without lower urinary tract symptoms: Secondary | ICD-10-CM | POA: Diagnosis not present

## 2022-10-19 DIAGNOSIS — N183 Chronic kidney disease, stage 3 unspecified: Secondary | ICD-10-CM | POA: Diagnosis not present

## 2022-10-19 DIAGNOSIS — D631 Anemia in chronic kidney disease: Secondary | ICD-10-CM | POA: Diagnosis not present

## 2022-10-19 DIAGNOSIS — E1142 Type 2 diabetes mellitus with diabetic polyneuropathy: Secondary | ICD-10-CM | POA: Diagnosis not present

## 2022-10-22 DIAGNOSIS — E782 Mixed hyperlipidemia: Secondary | ICD-10-CM | POA: Diagnosis not present

## 2022-10-22 DIAGNOSIS — I4891 Unspecified atrial fibrillation: Secondary | ICD-10-CM | POA: Diagnosis not present

## 2022-10-22 DIAGNOSIS — E1142 Type 2 diabetes mellitus with diabetic polyneuropathy: Secondary | ICD-10-CM | POA: Diagnosis not present

## 2022-10-22 DIAGNOSIS — F418 Other specified anxiety disorders: Secondary | ICD-10-CM | POA: Diagnosis not present

## 2022-10-22 DIAGNOSIS — I739 Peripheral vascular disease, unspecified: Secondary | ICD-10-CM | POA: Diagnosis not present

## 2022-10-22 DIAGNOSIS — Z86010 Personal history of colonic polyps: Secondary | ICD-10-CM | POA: Diagnosis not present

## 2022-10-22 DIAGNOSIS — N183 Chronic kidney disease, stage 3 unspecified: Secondary | ICD-10-CM | POA: Diagnosis not present

## 2022-10-22 DIAGNOSIS — G894 Chronic pain syndrome: Secondary | ICD-10-CM | POA: Diagnosis not present

## 2022-10-22 DIAGNOSIS — G4733 Obstructive sleep apnea (adult) (pediatric): Secondary | ICD-10-CM | POA: Diagnosis not present

## 2022-11-28 DIAGNOSIS — S43402A Unspecified sprain of left shoulder joint, initial encounter: Secondary | ICD-10-CM | POA: Diagnosis not present

## 2022-11-28 DIAGNOSIS — S80212A Abrasion, left knee, initial encounter: Secondary | ICD-10-CM | POA: Diagnosis not present

## 2022-12-17 DIAGNOSIS — E782 Mixed hyperlipidemia: Secondary | ICD-10-CM | POA: Diagnosis not present

## 2022-12-17 DIAGNOSIS — G4733 Obstructive sleep apnea (adult) (pediatric): Secondary | ICD-10-CM | POA: Diagnosis not present

## 2022-12-17 DIAGNOSIS — I739 Peripheral vascular disease, unspecified: Secondary | ICD-10-CM | POA: Diagnosis not present

## 2022-12-17 DIAGNOSIS — E1129 Type 2 diabetes mellitus with other diabetic kidney complication: Secondary | ICD-10-CM | POA: Diagnosis not present

## 2022-12-17 DIAGNOSIS — I4891 Unspecified atrial fibrillation: Secondary | ICD-10-CM | POA: Diagnosis not present

## 2022-12-17 DIAGNOSIS — Z86010 Personal history of colonic polyps: Secondary | ICD-10-CM | POA: Diagnosis not present

## 2022-12-17 DIAGNOSIS — M5136 Other intervertebral disc degeneration, lumbar region: Secondary | ICD-10-CM | POA: Diagnosis not present

## 2022-12-17 DIAGNOSIS — N183 Chronic kidney disease, stage 3 unspecified: Secondary | ICD-10-CM | POA: Diagnosis not present

## 2022-12-17 DIAGNOSIS — R809 Proteinuria, unspecified: Secondary | ICD-10-CM | POA: Diagnosis not present

## 2022-12-17 DIAGNOSIS — H4710 Unspecified papilledema: Secondary | ICD-10-CM | POA: Diagnosis not present

## 2022-12-17 DIAGNOSIS — E1142 Type 2 diabetes mellitus with diabetic polyneuropathy: Secondary | ICD-10-CM | POA: Diagnosis not present

## 2022-12-21 DIAGNOSIS — H53133 Sudden visual loss, bilateral: Secondary | ICD-10-CM | POA: Diagnosis not present

## 2022-12-21 DIAGNOSIS — H4710 Unspecified papilledema: Secondary | ICD-10-CM | POA: Diagnosis not present

## 2022-12-21 DIAGNOSIS — H47013 Ischemic optic neuropathy, bilateral: Secondary | ICD-10-CM | POA: Diagnosis not present

## 2022-12-21 DIAGNOSIS — H47211 Primary optic atrophy, right eye: Secondary | ICD-10-CM | POA: Diagnosis not present

## 2022-12-27 ENCOUNTER — Other Ambulatory Visit: Payer: Self-pay

## 2022-12-27 DIAGNOSIS — I6523 Occlusion and stenosis of bilateral carotid arteries: Secondary | ICD-10-CM

## 2022-12-27 DIAGNOSIS — M25512 Pain in left shoulder: Secondary | ICD-10-CM | POA: Diagnosis not present

## 2022-12-28 ENCOUNTER — Ambulatory Visit: Payer: Medicare Other | Admitting: Cardiology

## 2022-12-29 ENCOUNTER — Ambulatory Visit: Payer: Medicare Other | Admitting: Cardiology

## 2022-12-29 ENCOUNTER — Ambulatory Visit: Payer: Medicare Other

## 2022-12-29 DIAGNOSIS — H47013 Ischemic optic neuropathy, bilateral: Secondary | ICD-10-CM | POA: Diagnosis not present

## 2022-12-29 DIAGNOSIS — I6523 Occlusion and stenosis of bilateral carotid arteries: Secondary | ICD-10-CM

## 2023-01-03 NOTE — Progress Notes (Signed)
Carotid artery duplex 12/29/2022: Duplex suggests stenosis in the right internal carotid artery (minimal). Duplex suggests stenosis in the left internal carotid artery (minimal). Antegrade right vertebral artery flow. Antegrade left vertebral artery flow. Compared to the study done on 01/20/2022, there is further regression of ICA stenosis on the left from 15 to 49%.  There is very mild homogenous plaque noted in the bilateral carotid arteries.  Further studies if clinically indicated.

## 2023-01-04 DIAGNOSIS — Z7901 Long term (current) use of anticoagulants: Secondary | ICD-10-CM | POA: Diagnosis not present

## 2023-01-04 DIAGNOSIS — Z86010 Personal history of colonic polyps: Secondary | ICD-10-CM | POA: Diagnosis not present

## 2023-01-04 DIAGNOSIS — Z800 Family history of malignant neoplasm of digestive organs: Secondary | ICD-10-CM | POA: Diagnosis not present

## 2023-01-07 ENCOUNTER — Encounter: Payer: Self-pay | Admitting: Cardiology

## 2023-01-08 NOTE — Progress Notes (Signed)
Very minimal disease, will scan again if needed

## 2023-01-12 DIAGNOSIS — M25512 Pain in left shoulder: Secondary | ICD-10-CM | POA: Diagnosis not present

## 2023-01-25 NOTE — Progress Notes (Signed)
Spoke with patient's wife and she will relay the message.

## 2023-01-26 DIAGNOSIS — M25512 Pain in left shoulder: Secondary | ICD-10-CM | POA: Diagnosis not present

## 2023-01-31 DIAGNOSIS — E668 Other obesity: Secondary | ICD-10-CM | POA: Diagnosis not present

## 2023-01-31 DIAGNOSIS — G4733 Obstructive sleep apnea (adult) (pediatric): Secondary | ICD-10-CM | POA: Diagnosis not present

## 2023-01-31 DIAGNOSIS — I4891 Unspecified atrial fibrillation: Secondary | ICD-10-CM | POA: Diagnosis not present

## 2023-02-25 DIAGNOSIS — I129 Hypertensive chronic kidney disease with stage 1 through stage 4 chronic kidney disease, or unspecified chronic kidney disease: Secondary | ICD-10-CM | POA: Diagnosis not present

## 2023-02-25 DIAGNOSIS — N183 Chronic kidney disease, stage 3 unspecified: Secondary | ICD-10-CM | POA: Diagnosis not present

## 2023-02-25 DIAGNOSIS — N1831 Chronic kidney disease, stage 3a: Secondary | ICD-10-CM | POA: Diagnosis not present

## 2023-02-25 DIAGNOSIS — Z794 Long term (current) use of insulin: Secondary | ICD-10-CM | POA: Diagnosis not present

## 2023-02-25 DIAGNOSIS — E1122 Type 2 diabetes mellitus with diabetic chronic kidney disease: Secondary | ICD-10-CM | POA: Diagnosis not present

## 2023-03-02 DIAGNOSIS — E1122 Type 2 diabetes mellitus with diabetic chronic kidney disease: Secondary | ICD-10-CM | POA: Diagnosis not present

## 2023-03-02 DIAGNOSIS — R801 Persistent proteinuria, unspecified: Secondary | ICD-10-CM | POA: Diagnosis not present

## 2023-03-02 DIAGNOSIS — N1832 Chronic kidney disease, stage 3b: Secondary | ICD-10-CM | POA: Diagnosis not present

## 2023-03-02 DIAGNOSIS — I129 Hypertensive chronic kidney disease with stage 1 through stage 4 chronic kidney disease, or unspecified chronic kidney disease: Secondary | ICD-10-CM | POA: Diagnosis not present

## 2023-03-02 DIAGNOSIS — Z7984 Long term (current) use of oral hypoglycemic drugs: Secondary | ICD-10-CM | POA: Diagnosis not present

## 2023-03-02 DIAGNOSIS — D631 Anemia in chronic kidney disease: Secondary | ICD-10-CM | POA: Diagnosis not present

## 2023-03-02 DIAGNOSIS — M898X9 Other specified disorders of bone, unspecified site: Secondary | ICD-10-CM | POA: Diagnosis not present

## 2023-03-02 DIAGNOSIS — Z794 Long term (current) use of insulin: Secondary | ICD-10-CM | POA: Diagnosis not present

## 2023-03-02 DIAGNOSIS — E559 Vitamin D deficiency, unspecified: Secondary | ICD-10-CM | POA: Diagnosis not present

## 2023-03-14 DIAGNOSIS — D125 Benign neoplasm of sigmoid colon: Secondary | ICD-10-CM | POA: Diagnosis not present

## 2023-03-14 DIAGNOSIS — D124 Benign neoplasm of descending colon: Secondary | ICD-10-CM | POA: Diagnosis not present

## 2023-03-14 DIAGNOSIS — Z86010 Personal history of colonic polyps: Secondary | ICD-10-CM | POA: Diagnosis not present

## 2023-03-14 DIAGNOSIS — Z09 Encounter for follow-up examination after completed treatment for conditions other than malignant neoplasm: Secondary | ICD-10-CM | POA: Diagnosis not present

## 2023-03-14 DIAGNOSIS — K5730 Diverticulosis of large intestine without perforation or abscess without bleeding: Secondary | ICD-10-CM | POA: Diagnosis not present

## 2023-03-14 DIAGNOSIS — K648 Other hemorrhoids: Secondary | ICD-10-CM | POA: Diagnosis not present

## 2023-03-14 DIAGNOSIS — D123 Benign neoplasm of transverse colon: Secondary | ICD-10-CM | POA: Diagnosis not present

## 2023-03-16 DIAGNOSIS — D125 Benign neoplasm of sigmoid colon: Secondary | ICD-10-CM | POA: Diagnosis not present

## 2023-03-18 DIAGNOSIS — G894 Chronic pain syndrome: Secondary | ICD-10-CM | POA: Diagnosis not present

## 2023-04-05 DIAGNOSIS — E113291 Type 2 diabetes mellitus with mild nonproliferative diabetic retinopathy without macular edema, right eye: Secondary | ICD-10-CM | POA: Diagnosis not present

## 2023-04-05 DIAGNOSIS — I4891 Unspecified atrial fibrillation: Secondary | ICD-10-CM | POA: Diagnosis not present

## 2023-04-05 DIAGNOSIS — Z7901 Long term (current) use of anticoagulants: Secondary | ICD-10-CM | POA: Diagnosis not present

## 2023-04-05 DIAGNOSIS — E1142 Type 2 diabetes mellitus with diabetic polyneuropathy: Secondary | ICD-10-CM | POA: Diagnosis not present

## 2023-04-05 DIAGNOSIS — G4733 Obstructive sleep apnea (adult) (pediatric): Secondary | ICD-10-CM | POA: Diagnosis not present

## 2023-04-05 DIAGNOSIS — G894 Chronic pain syndrome: Secondary | ICD-10-CM | POA: Diagnosis not present

## 2023-04-05 DIAGNOSIS — N183 Chronic kidney disease, stage 3 unspecified: Secondary | ICD-10-CM | POA: Diagnosis not present

## 2023-04-05 DIAGNOSIS — J300 Vasomotor rhinitis: Secondary | ICD-10-CM | POA: Diagnosis not present

## 2023-04-05 DIAGNOSIS — E782 Mixed hyperlipidemia: Secondary | ICD-10-CM | POA: Diagnosis not present

## 2023-04-05 DIAGNOSIS — Z23 Encounter for immunization: Secondary | ICD-10-CM | POA: Diagnosis not present

## 2023-04-05 DIAGNOSIS — M5136 Other intervertebral disc degeneration, lumbar region: Secondary | ICD-10-CM | POA: Diagnosis not present

## 2023-04-18 ENCOUNTER — Other Ambulatory Visit: Payer: Self-pay | Admitting: Cardiology

## 2023-04-18 DIAGNOSIS — I5033 Acute on chronic diastolic (congestive) heart failure: Secondary | ICD-10-CM

## 2023-04-22 DIAGNOSIS — H2512 Age-related nuclear cataract, left eye: Secondary | ICD-10-CM | POA: Diagnosis not present

## 2023-04-22 DIAGNOSIS — H52203 Unspecified astigmatism, bilateral: Secondary | ICD-10-CM | POA: Diagnosis not present

## 2023-04-22 DIAGNOSIS — H47013 Ischemic optic neuropathy, bilateral: Secondary | ICD-10-CM | POA: Diagnosis not present

## 2023-05-03 ENCOUNTER — Ambulatory Visit: Payer: Medicare Other | Admitting: Podiatry

## 2023-05-23 DIAGNOSIS — I4891 Unspecified atrial fibrillation: Secondary | ICD-10-CM | POA: Diagnosis not present

## 2023-05-23 DIAGNOSIS — G4733 Obstructive sleep apnea (adult) (pediatric): Secondary | ICD-10-CM | POA: Diagnosis not present

## 2023-05-23 DIAGNOSIS — E6689 Other obesity not elsewhere classified: Secondary | ICD-10-CM | POA: Diagnosis not present

## 2023-06-10 DIAGNOSIS — Z7901 Long term (current) use of anticoagulants: Secondary | ICD-10-CM | POA: Diagnosis not present

## 2023-06-10 DIAGNOSIS — G894 Chronic pain syndrome: Secondary | ICD-10-CM | POA: Diagnosis not present

## 2023-06-10 DIAGNOSIS — E1142 Type 2 diabetes mellitus with diabetic polyneuropathy: Secondary | ICD-10-CM | POA: Diagnosis not present

## 2023-06-10 DIAGNOSIS — I4891 Unspecified atrial fibrillation: Secondary | ICD-10-CM | POA: Diagnosis not present

## 2023-06-10 DIAGNOSIS — M5136 Other intervertebral disc degeneration, lumbar region with discogenic back pain only: Secondary | ICD-10-CM | POA: Diagnosis not present

## 2023-06-10 DIAGNOSIS — E782 Mixed hyperlipidemia: Secondary | ICD-10-CM | POA: Diagnosis not present

## 2023-06-10 DIAGNOSIS — N183 Chronic kidney disease, stage 3 unspecified: Secondary | ICD-10-CM | POA: Diagnosis not present

## 2023-07-05 ENCOUNTER — Encounter: Payer: Self-pay | Admitting: Podiatry

## 2023-07-05 ENCOUNTER — Ambulatory Visit (INDEPENDENT_AMBULATORY_CARE_PROVIDER_SITE_OTHER): Payer: Medicare Other | Admitting: Podiatry

## 2023-07-05 DIAGNOSIS — N183 Chronic kidney disease, stage 3 unspecified: Secondary | ICD-10-CM

## 2023-07-05 DIAGNOSIS — M79674 Pain in right toe(s): Secondary | ICD-10-CM | POA: Diagnosis not present

## 2023-07-05 DIAGNOSIS — B351 Tinea unguium: Secondary | ICD-10-CM | POA: Diagnosis not present

## 2023-07-05 DIAGNOSIS — E1122 Type 2 diabetes mellitus with diabetic chronic kidney disease: Secondary | ICD-10-CM

## 2023-07-05 DIAGNOSIS — Z794 Long term (current) use of insulin: Secondary | ICD-10-CM

## 2023-07-05 DIAGNOSIS — M79675 Pain in left toe(s): Secondary | ICD-10-CM

## 2023-07-10 NOTE — Progress Notes (Signed)
  Subjective:  Patient ID: Jason Huang, male    DOB: [birth year],  MRN: [id number]  72 y.o. male presents at risk foot care. Pt has h/o NIDDM with chronic kidney disease and painful thick toenails that are difficult to trim. Pain interferes with ambulation. Aggravating factors include wearing enclosed shoe gear. Pain is relieved with periodic professional debridement.  Chief Complaint  Patient presents with   Diabetes    DFC, PATIENT STATES HE SAW HIS PCP IN NOVEMBER , PATIENT A1C IS 7.6   New problem(s): None   PCP is Henrine Screws, MD.  Allergies  Allergen Reactions   Penicillins Anaphylaxis   Sulfa Antibiotics Rash   Celebrex [Celecoxib] Rash    Headache    Vioxx [Rofecoxib] Rash   Review of Systems: Negative except as noted in the HPI.   Objective:  Jason Huang is a pleasant 72 y.o. male in NAD. AAO x 3.  Vascular Examination: Vascular status intact b/l with palpable pedal pulses. CFT immediate b/l. Pedal hair present. No edema. No pain with calf compression b/l. Skin temperature gradient WNL b/l. No varicosities noted. No cyanosis or clubbing noted.  Neurological Examination: Sensation decreased with 10 gram monofilament.  Dermatological Examination: Pedal skin with normal turgor, texture and tone b/l. No open wounds nor interdigital macerations noted. Toenails 1-5 b/l thick, discolored, elongated with subungual debris and pain on dorsal palpation. No hyperkeratotic lesions noted b/l.   Musculoskeletal Examination: Muscle strength 5/5 to b/l LE.  No pain, crepitus noted b/l. No gross pedal deformities. Patient ambulates independently without assistive aids.   Radiographs: None  Last A1c:       No data to display           Assessment:   1. Pain due to onychomycosis of toenails of both feet   2. Type 2 diabetes mellitus with stage 3 chronic kidney disease, with long-term current use of insulin, unspecified whether stage 3a or 3b CKD (HCC)    Plan:   Patient was evaluated and treated. All patient's and/or POA's questions/concerns addressed on today's visit. Toenails 1-5 debrided in length and girth without incident. Continue soft, supportive shoe gear daily. Report any pedal injuries to medical professional. Call office if there are any questions/concerns. -Continue foot and shoe inspections daily. Monitor blood glucose per PCP/Endocrinologist's recommendations. -Toenails 1-5 b/l were debrided in length and girth with sterile nail nippers and dremel without iatrogenic bleeding.  -Patient/POA to call should there be question/concern in the interim.  Return in about 9 weeks (around 09/06/2023).  Freddie Breech, DPM       LOCATION: [address], [place] [address]                   Office [phone]   [facility] LOCATION: [address] [place], [place] [address] Office [phone]

## 2023-07-11 ENCOUNTER — Other Ambulatory Visit: Payer: Self-pay | Admitting: Cardiology

## 2023-07-11 DIAGNOSIS — I4719 Other supraventricular tachycardia: Secondary | ICD-10-CM

## 2023-08-02 ENCOUNTER — Ambulatory Visit: Payer: Self-pay | Admitting: Cardiology

## 2023-08-13 ENCOUNTER — Other Ambulatory Visit: Payer: Self-pay | Admitting: Cardiology

## 2023-08-15 NOTE — Telephone Encounter (Signed)
Prescription refill request for Xarelto received.  Indication:afib Last office visit:1/24 Weight:113.4  kg Age:73 Scr:1.54  2/24 CrCl:69.55  ml/min  Prescription refilled

## 2023-08-23 DIAGNOSIS — E782 Mixed hyperlipidemia: Secondary | ICD-10-CM | POA: Diagnosis not present

## 2023-08-23 DIAGNOSIS — Z6841 Body Mass Index (BMI) 40.0 and over, adult: Secondary | ICD-10-CM | POA: Diagnosis not present

## 2023-08-23 DIAGNOSIS — D631 Anemia in chronic kidney disease: Secondary | ICD-10-CM | POA: Diagnosis not present

## 2023-08-23 DIAGNOSIS — N5201 Erectile dysfunction due to arterial insufficiency: Secondary | ICD-10-CM | POA: Diagnosis not present

## 2023-08-23 DIAGNOSIS — N183 Chronic kidney disease, stage 3 unspecified: Secondary | ICD-10-CM | POA: Diagnosis not present

## 2023-08-23 DIAGNOSIS — E1142 Type 2 diabetes mellitus with diabetic polyneuropathy: Secondary | ICD-10-CM | POA: Diagnosis not present

## 2023-08-23 DIAGNOSIS — G894 Chronic pain syndrome: Secondary | ICD-10-CM | POA: Diagnosis not present

## 2023-08-23 DIAGNOSIS — G4733 Obstructive sleep apnea (adult) (pediatric): Secondary | ICD-10-CM | POA: Diagnosis not present

## 2023-08-23 DIAGNOSIS — M5136 Other intervertebral disc degeneration, lumbar region with discogenic back pain only: Secondary | ICD-10-CM | POA: Diagnosis not present

## 2023-08-29 DIAGNOSIS — E559 Vitamin D deficiency, unspecified: Secondary | ICD-10-CM | POA: Diagnosis not present

## 2023-08-29 DIAGNOSIS — N183 Chronic kidney disease, stage 3 unspecified: Secondary | ICD-10-CM | POA: Diagnosis not present

## 2023-08-29 DIAGNOSIS — I129 Hypertensive chronic kidney disease with stage 1 through stage 4 chronic kidney disease, or unspecified chronic kidney disease: Secondary | ICD-10-CM | POA: Diagnosis not present

## 2023-08-29 DIAGNOSIS — D631 Anemia in chronic kidney disease: Secondary | ICD-10-CM | POA: Diagnosis not present

## 2023-08-29 DIAGNOSIS — N1831 Chronic kidney disease, stage 3a: Secondary | ICD-10-CM | POA: Diagnosis not present

## 2023-08-30 ENCOUNTER — Ambulatory Visit: Payer: Medicare Other | Attending: Cardiology | Admitting: Cardiology

## 2023-08-30 ENCOUNTER — Encounter: Payer: Self-pay | Admitting: Cardiology

## 2023-08-30 VITALS — BP 128/66 | HR 67 | Resp 12 | Ht 67.0 in | Wt 260.4 lb

## 2023-08-30 DIAGNOSIS — I453 Trifascicular block: Secondary | ICD-10-CM

## 2023-08-30 DIAGNOSIS — R29818 Other symptoms and signs involving the nervous system: Secondary | ICD-10-CM

## 2023-08-30 DIAGNOSIS — I10 Essential (primary) hypertension: Secondary | ICD-10-CM

## 2023-08-30 DIAGNOSIS — I480 Paroxysmal atrial fibrillation: Secondary | ICD-10-CM | POA: Diagnosis not present

## 2023-08-30 NOTE — Patient Instructions (Signed)
Medication Instructions:  Your physician recommends that you continue on your current medications as directed. Please refer to the Current Medication list given to you today.  *If you need a refill on your cardiac medications before your next appointment, please call your pharmacy*   Lab Work: none If you have labs (blood work) drawn today and your tests are completely normal, you will receive your results only by: MyChart Message (if you have MyChart) OR A paper copy in the mail If you have any lab test that is abnormal or we need to change your treatment, we will call you to review the results.   Testing/Procedures: none   Follow-Up: At [facility], you and your health needs are our priority.  As part of our continuing mission to provide you with exceptional heart care, we have created designated Provider Care Teams.  These Care Teams include your primary Cardiologist (physician) and Advanced Practice Providers (APPs -  Physician Assistants and Nurse Practitioners) who all work together to provide you with the care you need, when you need it.  We recommend signing up for the patient portal called "MyChart".  Sign up information is provided on this After Visit Summary.  MyChart is used to connect with patients for Virtual Visits (Telemedicine).  Patients are able to view lab/test results, encounter notes, upcoming appointments, etc.  Non-urgent messages can be sent to your provider as well.   To learn more about what you can do with MyChart, go to [email or web address].    Your next appointment:   12 month(s)  Provider:   Yates Decamp, MD     Other Instructions Please discuss with nephrologist regarding Jardiance or Farxiga.  Also check with endocrinologist  about possibly changing from Ozempic to [facility]

## 2023-08-30 NOTE — Progress Notes (Signed)
Cardiology Office Note:  .   Date:  08/30/2023  ID:  Jason Huang, DOB 02/04/1951, MRN [id number] PCP: Henrine Screws, MD  [facility] Providers Cardiologist:  Yates Decamp, MD   History of Present Illness: .   Jason Huang is a 73 y.o. Caucasian male with degenerative joint disease, chronic back pain with extensive lumbar surgery in the remote past, diabetes mellitus with chronic stage IIIa chronic kidney disease with hyperkalemia and follows nephrology, OSA on CPAP and peripheral neuropathy, pseudoclaudication, no significant peripheral artery disease by peripheral angiography in 2023, found to have new onset atrial fibrillation during preoperative EKG in February 2022.   Denies any chest pain, palpitations, dyspnea.  Back pain continues to be a major issue.  Does admit to lack of activity due to severe back pain.  Discussed the use of AI scribe software for clinical note transcription with the patient, who gave verbal consent to proceed.  History of Present Illness    The patient's chronic back pain continues to be a significant issue. He reports difficulty walking due to the pain, which limits his ability to exercise and contributes to his struggle with weight loss. He has previously been on oxycodone for pain management but has stopped taking it due to difficulties with his worker's compensation.   The patient's diabetes is currently managed with glipizide, Ozempic, and Lantus. However, he reports that the Ozempic has not helped with weight loss, despite being on the maximum dose. He also expresses frustration with his inability to exercise due to his back pain, which further complicates his weight management.  The patient also mentions a history of paroxysmal atrial fibrillation, for which he is on Xarelto to prevent stroke. He reports no recent episodes of atrial fibrillation.      Labs   Lab Results  Component Value Date   CHOL 127 07/07/2021   HDL 40 07/07/2021    LDLCALC 59 07/07/2021   LDLDIRECT 60 07/07/2021   TRIG 164 (H) 07/07/2021   Lab Results  Component Value Date   NA 140 09/02/2022   K 4.8 09/02/2022   CO2 26 09/02/2022   GLUCOSE 97 09/02/2022   BUN 27 (H) 09/02/2022   CREATININE 1.54 (H) 09/02/2022   CALCIUM 9.3 09/02/2022   EGFR 47 (L) 07/15/2022   GFRNONAA 48 (L) 09/02/2022      Latest Ref Rng & Units 09/02/2022    1:06 PM 07/23/2022   11:12 AM 07/23/2022   11:00 AM  BMP  Glucose 70 - 99 mg/dL 97  244  010   BUN 8 - 23 mg/dL 27  26  40   Creatinine 0.61 - 1.24 mg/dL 2.72  5.36  6.44   Sodium 135 - 145 mmol/L 140  139  137   Potassium 3.5 - 5.1 mmol/L 4.8  5.1  6.2   Chloride 98 - 111 mmol/L 105  105  106   CO2 22 - 32 mmol/L 26     Calcium 8.9 - 10.3 mg/dL 9.3         Latest Ref Rng & Units 09/02/2022    1:06 PM 07/23/2022   11:12 AM 07/23/2022   11:00 AM  CBC  WBC 4.0 - 10.5 K/uL 5.9     Hemoglobin 13.0 - 17.0 g/dL 03.4  74.2  59.5   Hematocrit 39.0 - 52.0 % 38.9  36.0  38.0   Platelets 150 - 400 K/uL 143      External Labs:  PCP labs 10/19/2022:  Total  cholesterol 118, triglycerides 205, HDL 38, LDL 47.  Labs 03/18/2023:  Serum creatinine  Labs 08/23/2023:  A1c 9.0%.  Review of Systems  Cardiovascular:  Negative for chest pain, dyspnea on exertion and leg swelling.  Musculoskeletal:  Positive for back pain and joint pain.    Physical Exam:   VS:  BP 128/66 (BP Location: Left Arm, Patient Position: Sitting, Cuff Size: Large)   Pulse 67   Resp 12   Ht 5\' 7"  (1.702 m)   Wt 260 lb 6.4 oz (118.1 kg)   SpO2 93%   BMI 40.78 kg/m    Wt Readings from Last 3 Encounters:  08/30/23 260 lb 6.4 oz (118.1 kg)  09/02/22 250 lb (113.4 kg)  08/03/22 263 lb 3.2 oz (119.4 kg)     Physical Exam Constitutional:      Appearance: He is morbidly obese.  Neck:     Vascular: No carotid bruit or JVD.  Cardiovascular:     Rate and Rhythm: Normal rate and regular rhythm.     Pulses:          Dorsalis pedis pulses are 0 on  the right side and 0 on the left side.       Posterior tibial pulses are 0 on the right side and 0 on the left side.     Heart sounds: Normal heart sounds. No murmur heard.    No gallop.  Pulmonary:     Effort: Pulmonary effort is normal.     Breath sounds: Normal breath sounds.  Abdominal:     General: Bowel sounds are normal.     Palpations: Abdomen is soft.  Musculoskeletal:     Right lower leg: No edema.     Left lower leg: No edema.     Studies Reviewed: Marland Kitchen     EKG:    EKG Interpretation Date/Time:  Tuesday August 30 2023 11:42:17 EST Ventricular Rate:  68 PR Interval:  300 QRS Duration:  152 QT Interval:  424 QTC Calculation: 450 R Axis:   107  Text Interpretation: EKG 08/30/2023: Sinus rhythm with first-degree AV block at the rate of 68 bpm, right axis deviation, left posterior fascicular block.  Right bundle branch block.  Low-voltage complexes.  Pulmonary disease pattern.  PAC (1) compared to 07/02/2022, no significant change. Confirmed by Delrae Rend [phone]) on 08/30/2023 12:08:06 PM    EKG 07/02/2022: Sinus rhythm with first-degree AV block at rate of 69 beats minute, left atrial enlargement, right axis deviation, left posterior fascicular block. Right bundle branch block. Trifascicular block. Poor R wave progression, low-voltage complexes. Pulmonary disease pattern. Nonspecific T abnormality. PACs (2 in bigeminal pattern.   Medications and allergies    Allergies  Allergen Reactions   Penicillins Anaphylaxis   Sulfa Antibiotics Rash   Celebrex [Celecoxib] Rash    Headache    Vioxx [Rofecoxib] Rash    Current Outpatient Medications:    acetaminophen (TYLENOL) 500 MG tablet, Take 500 mg by mouth every 6 (six) hours as needed for headache., Disp: , Rfl:    Ascorbic Acid (VITAMIN C) 1000 MG tablet, Take 1,000 mg by mouth daily., Disp: , Rfl:    docusate sodium (COLACE) 100 MG capsule, Take 100-200 mg by mouth daily as needed for mild constipation., Disp: ,  Rfl:    escitalopram (LEXAPRO) 20 MG tablet, Take 20 mg by mouth daily., Disp: , Rfl: 1   gabapentin (NEURONTIN) 100 MG capsule, Take 200 mg by mouth 4 (four) times daily., Disp: ,  Rfl:    glipiZIDE (GLUCOTROL) 10 MG tablet, Take 10 mg by mouth 2 (two) times daily., Disp: , Rfl: 4   insulin glargine (LANTUS) 100 UNIT/ML Solostar Pen, Inject 45 Units into the skin 2 (two) times daily., Disp: , Rfl:    lidocaine (LIDODERM) 5 %, Place 1 patch onto the skin daily as needed (pain)., Disp: , Rfl:    Melatonin 10 MG TABS, Take 10 mg by mouth at bedtime as needed (sleep)., Disp: , Rfl:    metoprolol tartrate (LOPRESSOR) 25 MG tablet, Take 1 tablet (25 mg total) by mouth 2 (two) times daily. Patient must attend upcoming appointment for future refills, Disp: 60 tablet, Rfl: 0   Multiple Vitamins-Minerals (MULTIVITAMIN WITH MINERALS) tablet, Take 1 tablet by mouth daily., Disp: , Rfl:    omeprazole (PRILOSEC) 20 MG capsule, Take 20 mg by mouth every morning., Disp: , Rfl:    oxyCODONE (ROXICODONE) 15 MG immediate release tablet, Take 15 mg by mouth 3 (three) times daily., Disp: , Rfl: 0   Polyethyl Glycol-Propyl Glycol (SYSTANE OP), Place 1 drop into both eyes 2 (two) times daily as needed (dry eyes)., Disp: , Rfl:    Semaglutide,0.25 or 0.5MG /DOS, (OZEMPIC, 0.25 OR 0.5 MG/DOSE,) 2 MG/1.5ML SOPN, Inject 2 mg into the skin every Tuesday., Disp: , Rfl:    simvastatin (ZOCOR) 40 MG tablet, Take 40 mg by mouth every evening., Disp: , Rfl:    torsemide (DEMADEX) 20 MG tablet, TAKE ONE TABLET BY MOUTH EVERY DAY AS NEEDED FOR IN LEG SWELLING, Disp: 90 tablet, Rfl: 0   trandolapril (MAVIK) 1 MG tablet, Take 0.5 mg by mouth daily., Disp: , Rfl:    traZODone (DESYREL) 100 MG tablet, Take 100 mg by mouth at bedtime., Disp: , Rfl:    vitamin B-12 (CYANOCOBALAMIN) 1000 MCG tablet, Take 1,000 mcg by mouth daily., Disp: , Rfl:    XARELTO 15 MG TABS tablet, TAKE 1 TABLET BY MOUTH DAILY., Disp: 90 tablet, Rfl: 3    ASSESSMENT AND PLAN: .      ICD-10-CM   1. Paroxysmal atrial fibrillation (HCC)  I48.0 EKG 12-Lead    2. Primary hypertension  I10     3. Neurogenic claudication  R29.818     4. Trifascicular block  I45.3      Click Here to Calculate/Change CHADS2VASc Score The patient's CHADS2-VASc score is 4, indicating a 4.8% annual risk of stroke.  Therefore, anticoagulation is recommended.   CHF History: No HTN History: Yes Diabetes History: Yes Stroke History: No Vascular Disease History: Yes   Assessment and Plan    Paroxysmal Atrial Fibrillation Paroxysmal atrial fibrillation presents a significant stroke risk due to hypertension and diabetes. Currently on Xarelto for stroke prevention, with no recent AFib episodes. EKG shows a trifascicular block with no significant changes from previous results. Continuing anticoagulation is crucial to prevent stroke. Continue Xarelto and monitor for symptoms of syncope or near-syncope. Follow-up in one year.  Hypertension Hypertension is well-controlled with torsemide 20 mg once daily and metoprolol 25 mg twice daily. Continue current medications.  Chronic Kidney Disease Stage 3 Stage 3 chronic kidney disease shows slightly deteriorated kidney function. Recent blood work has been completed. A follow-up with nephrologist Dr. Jonni Sanger is scheduled. Consider adding Marcelline Deist or Jardiance for renal protection.  Type 2 Diabetes Mellitus Diabetes is managed with glipizide, Lantus, and Ozempic, though Ozempic is ineffective for weight loss. Discuss switching to [facility] and adding [place] or Jardiance for improved glycemic control, weight loss, and renal  protection. Mounjaro may cause nausea but can significantly aid in weight loss, which would alleviate back pain and improve overall health. Every pound lost can reduce spinal load by six pounds. Discuss switching from Ozempic to [facility] with the endocrinologist and consider adding [place] or Jardiance.  Follow-up with the endocrinologist.  Chronic Back Pain Chronic back pain was previously managed with oxycodone, now discontinued. Discuss new non-opioid medications for pain management with the primary care physician. Difficulty exercising due to back pain contributes to weight issues. Weight loss is important to alleviate back pain.  Obstructive Sleep Apnea Using BiPAP for obstructive sleep apnea with no reported issues. Continue BiPAP therapy.  Follow-up Follow-up with nephrologist Dr. Jonni Sanger on Friday, with the endocrinologist for diabetes management, and with the cardiology in one year.    Signed,  Yates Decamp, MD, [facility] 08/30/2023, 12:19 PM [facility] [address] [place], [place] [address] Phone: [phone]. Fax:  [phone]

## 2023-08-31 ENCOUNTER — Encounter: Payer: Self-pay | Admitting: Podiatry

## 2023-08-31 ENCOUNTER — Ambulatory Visit (INDEPENDENT_AMBULATORY_CARE_PROVIDER_SITE_OTHER): Payer: Medicare Other | Admitting: Podiatry

## 2023-08-31 DIAGNOSIS — N183 Chronic kidney disease, stage 3 unspecified: Secondary | ICD-10-CM

## 2023-08-31 DIAGNOSIS — E1122 Type 2 diabetes mellitus with diabetic chronic kidney disease: Secondary | ICD-10-CM

## 2023-08-31 DIAGNOSIS — B351 Tinea unguium: Secondary | ICD-10-CM

## 2023-08-31 DIAGNOSIS — M79674 Pain in right toe(s): Secondary | ICD-10-CM

## 2023-08-31 DIAGNOSIS — Z794 Long term (current) use of insulin: Secondary | ICD-10-CM

## 2023-08-31 DIAGNOSIS — M79675 Pain in left toe(s): Secondary | ICD-10-CM

## 2023-09-02 DIAGNOSIS — D631 Anemia in chronic kidney disease: Secondary | ICD-10-CM | POA: Diagnosis not present

## 2023-09-02 DIAGNOSIS — E1122 Type 2 diabetes mellitus with diabetic chronic kidney disease: Secondary | ICD-10-CM | POA: Diagnosis not present

## 2023-09-02 DIAGNOSIS — E559 Vitamin D deficiency, unspecified: Secondary | ICD-10-CM | POA: Diagnosis not present

## 2023-09-02 DIAGNOSIS — M898X9 Other specified disorders of bone, unspecified site: Secondary | ICD-10-CM | POA: Diagnosis not present

## 2023-09-02 DIAGNOSIS — I129 Hypertensive chronic kidney disease with stage 1 through stage 4 chronic kidney disease, or unspecified chronic kidney disease: Secondary | ICD-10-CM | POA: Diagnosis not present

## 2023-09-02 DIAGNOSIS — Z794 Long term (current) use of insulin: Secondary | ICD-10-CM | POA: Diagnosis not present

## 2023-09-02 DIAGNOSIS — Z7984 Long term (current) use of oral hypoglycemic drugs: Secondary | ICD-10-CM | POA: Diagnosis not present

## 2023-09-02 DIAGNOSIS — Z7985 Long-term (current) use of injectable non-insulin antidiabetic drugs: Secondary | ICD-10-CM | POA: Diagnosis not present

## 2023-09-02 DIAGNOSIS — N1832 Chronic kidney disease, stage 3b: Secondary | ICD-10-CM | POA: Diagnosis not present

## 2023-09-05 ENCOUNTER — Other Ambulatory Visit: Payer: Self-pay | Admitting: Cardiology

## 2023-09-05 DIAGNOSIS — I4719 Other supraventricular tachycardia: Secondary | ICD-10-CM

## 2023-09-08 NOTE — Progress Notes (Signed)
  Subjective:  Patient ID: Jason Huang, male    DOB: January 19, 1951,  MRN: [id number]  Chief Complaint  Patient presents with   dfc    He is here for a nail trim, does not remebmer last A1C, PCP is dr. Abigail Miyamoto and last seen 4 months ago.       73 y.o. male presents at risk foot care. Pt has h/o NIDDM with chronic kidney disease and painful, elongated thickened toenails x 10 which are symptomatic when wearing enclosed shoe gear. This interferes with his/her daily activities..    New problem(s): None    PCP: Henrine Screws, MD and last visit was: August 23, 2023.  Review of Systems: Negative except as noted in the HPI.   Allergies  Allergen Reactions   Penicillins Anaphylaxis   Sulfa Antibiotics Rash   Celebrex [Celecoxib] Rash    Headache    Vioxx [Rofecoxib] Rash    Objective:  There were no vitals filed for this visit. Constitutional Patient is a pleasant 73 y.o. male obese in NAD. AAO x 3.  Vascular Capillary fill time to digits immediate b/l.  DP/PT pulse(s) are palpable b/l lower extremities. Pedal hair sparse. Lower extremity skin temperature gradient within normal limits. No pain with calf compression b/l. No edema noted b/l lower extremities. No cyanosis or clubbing noted.   Neurologic Protective sensation decreased with 10 gram monofilament b/l.  Dermatologic Pedal skin is warm and supple b/l.  No open wounds b/l lower extremities. No interdigital macerations b/l lower extremities. Toenails 1-5 b/l elongated, discolored, dystrophic, thickened, crumbly with subungual debris and tenderness to dorsal palpation. No corns, calluses nor porokeratotic lesions noted.  Orthopedic: Normal muscle strength 5/5 to all lower extremity muscle groups bilaterally. Patient ambulates independent of any assistive aids. No gross bony deformities bilaterally.   Radiographs:  None  Assessment:   1. Pain due to onychomycosis of toenails of both feet   2. Type 2 diabetes mellitus with stage 3  chronic kidney disease, with long-term current use of insulin, unspecified whether stage 3a or 3b CKD (HCC)    Plan:  Patient was evaluated and treated. All patient's and/or POA's questions/concerns addressed on today's visit. Mycotic toenails 1-5 debrided in length and girth without incident.  Continue daily foot inspections and monitor blood glucose per PCP/Endocrinologist's recommendations.Continue soft, supportive shoe gear daily. Report any pedal injuries to medical professional. Call office if there are any quesitons/concerns. -Patient/POA to call should there be question/concern in the interim.  Return in about 4 months (around 12/29/2023).  Freddie Breech, DPM      [place] LOCATION: [address] [place] [address]                   Office [phone]   [facility] LOCATION: [address] [place], [place] [address] Office [phone]

## 2023-10-03 ENCOUNTER — Other Ambulatory Visit: Payer: Self-pay | Admitting: Cardiology

## 2023-10-03 DIAGNOSIS — I5033 Acute on chronic diastolic (congestive) heart failure: Secondary | ICD-10-CM

## 2023-11-01 ENCOUNTER — Ambulatory Visit: Payer: Medicare Other | Admitting: Podiatry

## 2023-11-22 DIAGNOSIS — H5340 Unspecified visual field defects: Secondary | ICD-10-CM | POA: Diagnosis not present

## 2023-11-25 DIAGNOSIS — Z794 Long term (current) use of insulin: Secondary | ICD-10-CM | POA: Diagnosis not present

## 2023-11-25 DIAGNOSIS — E113291 Type 2 diabetes mellitus with mild nonproliferative diabetic retinopathy without macular edema, right eye: Secondary | ICD-10-CM | POA: Diagnosis not present

## 2023-11-25 DIAGNOSIS — E782 Mixed hyperlipidemia: Secondary | ICD-10-CM | POA: Diagnosis not present

## 2023-11-25 DIAGNOSIS — N183 Chronic kidney disease, stage 3 unspecified: Secondary | ICD-10-CM | POA: Diagnosis not present

## 2023-11-25 DIAGNOSIS — N529 Male erectile dysfunction, unspecified: Secondary | ICD-10-CM | POA: Diagnosis not present

## 2023-11-25 DIAGNOSIS — G894 Chronic pain syndrome: Secondary | ICD-10-CM | POA: Diagnosis not present

## 2023-11-25 DIAGNOSIS — Z6839 Body mass index (BMI) 39.0-39.9, adult: Secondary | ICD-10-CM | POA: Diagnosis not present

## 2023-11-25 DIAGNOSIS — G4733 Obstructive sleep apnea (adult) (pediatric): Secondary | ICD-10-CM | POA: Diagnosis not present

## 2023-11-25 DIAGNOSIS — D631 Anemia in chronic kidney disease: Secondary | ICD-10-CM | POA: Diagnosis not present

## 2023-11-25 DIAGNOSIS — E1142 Type 2 diabetes mellitus with diabetic polyneuropathy: Secondary | ICD-10-CM | POA: Diagnosis not present

## 2023-11-25 DIAGNOSIS — M5136 Other intervertebral disc degeneration, lumbar region with discogenic back pain only: Secondary | ICD-10-CM | POA: Diagnosis not present

## 2023-12-02 DIAGNOSIS — H4720 Unspecified optic atrophy: Secondary | ICD-10-CM | POA: Diagnosis not present

## 2023-12-02 DIAGNOSIS — H2512 Age-related nuclear cataract, left eye: Secondary | ICD-10-CM | POA: Diagnosis not present

## 2023-12-05 DIAGNOSIS — E1129 Type 2 diabetes mellitus with other diabetic kidney complication: Secondary | ICD-10-CM | POA: Diagnosis not present

## 2023-12-05 DIAGNOSIS — E113291 Type 2 diabetes mellitus with mild nonproliferative diabetic retinopathy without macular edema, right eye: Secondary | ICD-10-CM | POA: Diagnosis not present

## 2023-12-05 DIAGNOSIS — I739 Peripheral vascular disease, unspecified: Secondary | ICD-10-CM | POA: Diagnosis not present

## 2023-12-05 DIAGNOSIS — I4891 Unspecified atrial fibrillation: Secondary | ICD-10-CM | POA: Diagnosis not present

## 2023-12-17 DIAGNOSIS — E1142 Type 2 diabetes mellitus with diabetic polyneuropathy: Secondary | ICD-10-CM | POA: Diagnosis not present

## 2023-12-17 DIAGNOSIS — E782 Mixed hyperlipidemia: Secondary | ICD-10-CM | POA: Diagnosis not present

## 2023-12-17 DIAGNOSIS — E113291 Type 2 diabetes mellitus with mild nonproliferative diabetic retinopathy without macular edema, right eye: Secondary | ICD-10-CM | POA: Diagnosis not present

## 2023-12-17 DIAGNOSIS — I739 Peripheral vascular disease, unspecified: Secondary | ICD-10-CM | POA: Diagnosis not present

## 2023-12-17 DIAGNOSIS — E1129 Type 2 diabetes mellitus with other diabetic kidney complication: Secondary | ICD-10-CM | POA: Diagnosis not present

## 2023-12-17 DIAGNOSIS — N400 Enlarged prostate without lower urinary tract symptoms: Secondary | ICD-10-CM | POA: Diagnosis not present

## 2023-12-17 DIAGNOSIS — I4891 Unspecified atrial fibrillation: Secondary | ICD-10-CM | POA: Diagnosis not present

## 2023-12-22 DIAGNOSIS — R053 Chronic cough: Secondary | ICD-10-CM | POA: Diagnosis not present

## 2024-01-02 DIAGNOSIS — R053 Chronic cough: Secondary | ICD-10-CM | POA: Diagnosis not present

## 2024-01-03 ENCOUNTER — Ambulatory Visit (INDEPENDENT_AMBULATORY_CARE_PROVIDER_SITE_OTHER): Payer: Medicare Other | Admitting: Podiatry

## 2024-01-03 ENCOUNTER — Encounter: Payer: Self-pay | Admitting: Podiatry

## 2024-01-03 VITALS — Ht 67.0 in | Wt 260.4 lb

## 2024-01-03 DIAGNOSIS — M79675 Pain in left toe(s): Secondary | ICD-10-CM | POA: Diagnosis not present

## 2024-01-03 DIAGNOSIS — E1122 Type 2 diabetes mellitus with diabetic chronic kidney disease: Secondary | ICD-10-CM | POA: Diagnosis not present

## 2024-01-03 DIAGNOSIS — N183 Chronic kidney disease, stage 3 unspecified: Secondary | ICD-10-CM

## 2024-01-03 DIAGNOSIS — B351 Tinea unguium: Secondary | ICD-10-CM | POA: Diagnosis not present

## 2024-01-03 DIAGNOSIS — M79674 Pain in right toe(s): Secondary | ICD-10-CM

## 2024-01-03 DIAGNOSIS — Z794 Long term (current) use of insulin: Secondary | ICD-10-CM

## 2024-01-08 NOTE — Progress Notes (Signed)
  Subjective:  Patient ID: Jason Huang, male    DOB: [birth year],  MRN: [id number]  Jason Huang presents to clinic today for for annual diabetic foot examination  Chief Complaint  Patient presents with   Nail Problem    Pt is here for [facility] last A1C was 7.4 PCP is Dr Jason and LOV was in May.   New problem(s): None.   PCP is Jason Lamar, MD.  Allergies  Allergen Reactions   Penicillins Anaphylaxis   Sulfa Antibiotics Rash   Celebrex [Celecoxib] Rash    Headache    Vioxx [Rofecoxib] Rash    Review of Systems: Negative except as noted in the HPI.  Objective: No changes noted in today's physical examination. There were no vitals filed for this visit. Jason Huang is a pleasant 73 y.o. male obese in NAD. AAO x 3.  Vascular Examination: Capillary refill time immediate b/l. Palpable pedal pulses. Pedal hair present b/l. No pain with calf compression b/l. Skin temperature gradient WNL b/l. No cyanosis or clubbing b/l. No ischemia or gangrene noted b/l.   Neurological Examination: Sensation grossly intact b/l with 10 gram monofilament.   Dermatological Examination: Pedal skin with normal turgor, texture and tone b/l.  No open wounds. No interdigital macerations.   Toenails 1-5 b/l thick, discolored, elongated with subungual debris and pain on dorsal palpation.   No hyperkeratotic nor porokeratotic lesions present on today's visit.  Musculoskeletal Examination: Muscle strength 5/5 to all lower extremity muscle groups bilaterally. No pain, crepitus or joint limitation noted with ROM bilateral LE. No gross bony deformities bilaterally.  Radiographs: None  Assessment/Plan: 1. Pain due to onychomycosis of toenails of both feet   2. Type 2 diabetes mellitus with stage 3 chronic kidney disease, with long-term current use of insulin , unspecified whether stage 3a or 3b CKD (HCC)     Diabetic foot examination performed today. All patient's and/or POA's questions/concerns  addressed on today's visit. Toenails 1-5 debrided in length and girth without incident. Continue foot and shoe inspections daily. Monitor blood glucose per PCP/Endocrinologist's recommendations. Continue soft, supportive shoe gear daily. Report any pedal injuries to medical professional. Call office if there are any questions/concerns. -Patient/POA to call should there be question/concern in the interim.   Return in about 3 months (around 04/04/2024).  Delon LITTIE Merlin, DPM      [place] LOCATION: [address] [place] [address]                   Office [phone]   [facility] LOCATION: [address] [place], [place] [address] Office [phone]

## 2024-01-10 DIAGNOSIS — J849 Interstitial pulmonary disease, unspecified: Secondary | ICD-10-CM | POA: Diagnosis not present

## 2024-01-10 DIAGNOSIS — R053 Chronic cough: Secondary | ICD-10-CM | POA: Diagnosis not present

## 2024-01-10 DIAGNOSIS — R0609 Other forms of dyspnea: Secondary | ICD-10-CM | POA: Diagnosis not present

## 2024-01-12 DIAGNOSIS — R918 Other nonspecific abnormal finding of lung field: Secondary | ICD-10-CM | POA: Diagnosis not present

## 2024-01-12 DIAGNOSIS — J849 Interstitial pulmonary disease, unspecified: Secondary | ICD-10-CM | POA: Diagnosis not present

## 2024-01-12 DIAGNOSIS — J432 Centrilobular emphysema: Secondary | ICD-10-CM | POA: Diagnosis not present

## 2024-01-16 DIAGNOSIS — E1142 Type 2 diabetes mellitus with diabetic polyneuropathy: Secondary | ICD-10-CM | POA: Diagnosis not present

## 2024-01-16 DIAGNOSIS — N400 Enlarged prostate without lower urinary tract symptoms: Secondary | ICD-10-CM | POA: Diagnosis not present

## 2024-01-16 DIAGNOSIS — E782 Mixed hyperlipidemia: Secondary | ICD-10-CM | POA: Diagnosis not present

## 2024-01-16 DIAGNOSIS — I4891 Unspecified atrial fibrillation: Secondary | ICD-10-CM | POA: Diagnosis not present

## 2024-01-16 DIAGNOSIS — E1129 Type 2 diabetes mellitus with other diabetic kidney complication: Secondary | ICD-10-CM | POA: Diagnosis not present

## 2024-01-16 DIAGNOSIS — I739 Peripheral vascular disease, unspecified: Secondary | ICD-10-CM | POA: Diagnosis not present

## 2024-01-16 DIAGNOSIS — E113291 Type 2 diabetes mellitus with mild nonproliferative diabetic retinopathy without macular edema, right eye: Secondary | ICD-10-CM | POA: Diagnosis not present

## 2024-01-18 DIAGNOSIS — R053 Chronic cough: Secondary | ICD-10-CM | POA: Diagnosis not present

## 2024-01-18 DIAGNOSIS — R0609 Other forms of dyspnea: Secondary | ICD-10-CM | POA: Diagnosis not present

## 2024-01-23 ENCOUNTER — Other Ambulatory Visit (HOSPITAL_COMMUNITY): Payer: Self-pay

## 2024-01-23 DIAGNOSIS — R0609 Other forms of dyspnea: Secondary | ICD-10-CM

## 2024-01-24 ENCOUNTER — Other Ambulatory Visit (HOSPITAL_BASED_OUTPATIENT_CLINIC_OR_DEPARTMENT_OTHER)

## 2024-01-24 DIAGNOSIS — R0609 Other forms of dyspnea: Secondary | ICD-10-CM | POA: Diagnosis not present

## 2024-01-24 LAB — ECHOCARDIOGRAM COMPLETE
Area-P 1/2: 3.37 cm2
S' Lateral: 2.46 cm

## 2024-01-24 MED ORDER — PERFLUTREN LIPID MICROSPHERE
1.0000 mL | INTRAVENOUS | Status: AC | PRN
  Administered 2024-01-24: 1 mL via INTRAVENOUS

## 2024-02-01 DIAGNOSIS — Z794 Long term (current) use of insulin: Secondary | ICD-10-CM | POA: Diagnosis not present

## 2024-02-01 DIAGNOSIS — E785 Hyperlipidemia, unspecified: Secondary | ICD-10-CM | POA: Diagnosis not present

## 2024-02-01 DIAGNOSIS — N1831 Chronic kidney disease, stage 3a: Secondary | ICD-10-CM | POA: Diagnosis not present

## 2024-02-01 DIAGNOSIS — E1165 Type 2 diabetes mellitus with hyperglycemia: Secondary | ICD-10-CM | POA: Diagnosis not present

## 2024-02-01 DIAGNOSIS — E1169 Type 2 diabetes mellitus with other specified complication: Secondary | ICD-10-CM | POA: Diagnosis not present

## 2024-02-15 DIAGNOSIS — J4540 Moderate persistent asthma, uncomplicated: Secondary | ICD-10-CM | POA: Diagnosis not present

## 2024-02-15 DIAGNOSIS — J432 Centrilobular emphysema: Secondary | ICD-10-CM | POA: Diagnosis not present

## 2024-02-16 DIAGNOSIS — E1142 Type 2 diabetes mellitus with diabetic polyneuropathy: Secondary | ICD-10-CM | POA: Diagnosis not present

## 2024-02-16 DIAGNOSIS — E782 Mixed hyperlipidemia: Secondary | ICD-10-CM | POA: Diagnosis not present

## 2024-02-16 DIAGNOSIS — E1129 Type 2 diabetes mellitus with other diabetic kidney complication: Secondary | ICD-10-CM | POA: Diagnosis not present

## 2024-02-16 DIAGNOSIS — N400 Enlarged prostate without lower urinary tract symptoms: Secondary | ICD-10-CM | POA: Diagnosis not present

## 2024-02-16 DIAGNOSIS — E113291 Type 2 diabetes mellitus with mild nonproliferative diabetic retinopathy without macular edema, right eye: Secondary | ICD-10-CM | POA: Diagnosis not present

## 2024-02-16 DIAGNOSIS — I739 Peripheral vascular disease, unspecified: Secondary | ICD-10-CM | POA: Diagnosis not present

## 2024-02-16 DIAGNOSIS — I4891 Unspecified atrial fibrillation: Secondary | ICD-10-CM | POA: Diagnosis not present

## 2024-02-24 DIAGNOSIS — E1129 Type 2 diabetes mellitus with other diabetic kidney complication: Secondary | ICD-10-CM | POA: Diagnosis not present

## 2024-02-24 DIAGNOSIS — I739 Peripheral vascular disease, unspecified: Secondary | ICD-10-CM | POA: Diagnosis not present

## 2024-02-24 DIAGNOSIS — I4891 Unspecified atrial fibrillation: Secondary | ICD-10-CM | POA: Diagnosis not present

## 2024-02-24 DIAGNOSIS — E113291 Type 2 diabetes mellitus with mild nonproliferative diabetic retinopathy without macular edema, right eye: Secondary | ICD-10-CM | POA: Diagnosis not present

## 2024-02-28 DIAGNOSIS — D631 Anemia in chronic kidney disease: Secondary | ICD-10-CM | POA: Diagnosis not present

## 2024-02-28 DIAGNOSIS — Z794 Long term (current) use of insulin: Secondary | ICD-10-CM | POA: Diagnosis not present

## 2024-02-28 DIAGNOSIS — N1832 Chronic kidney disease, stage 3b: Secondary | ICD-10-CM | POA: Diagnosis not present

## 2024-02-28 DIAGNOSIS — E1122 Type 2 diabetes mellitus with diabetic chronic kidney disease: Secondary | ICD-10-CM | POA: Diagnosis not present

## 2024-02-28 DIAGNOSIS — N1831 Chronic kidney disease, stage 3a: Secondary | ICD-10-CM | POA: Diagnosis not present

## 2024-03-01 DIAGNOSIS — R801 Persistent proteinuria, unspecified: Secondary | ICD-10-CM | POA: Diagnosis not present

## 2024-03-01 DIAGNOSIS — M898X9 Other specified disorders of bone, unspecified site: Secondary | ICD-10-CM | POA: Diagnosis not present

## 2024-03-01 DIAGNOSIS — N1832 Chronic kidney disease, stage 3b: Secondary | ICD-10-CM | POA: Diagnosis not present

## 2024-03-01 DIAGNOSIS — I129 Hypertensive chronic kidney disease with stage 1 through stage 4 chronic kidney disease, or unspecified chronic kidney disease: Secondary | ICD-10-CM | POA: Diagnosis not present

## 2024-03-01 DIAGNOSIS — D631 Anemia in chronic kidney disease: Secondary | ICD-10-CM | POA: Diagnosis not present

## 2024-03-01 DIAGNOSIS — E559 Vitamin D deficiency, unspecified: Secondary | ICD-10-CM | POA: Diagnosis not present

## 2024-03-01 DIAGNOSIS — E1122 Type 2 diabetes mellitus with diabetic chronic kidney disease: Secondary | ICD-10-CM | POA: Diagnosis not present

## 2024-03-01 DIAGNOSIS — Z794 Long term (current) use of insulin: Secondary | ICD-10-CM | POA: Diagnosis not present

## 2024-03-05 DIAGNOSIS — Z0000 Encounter for general adult medical examination without abnormal findings: Secondary | ICD-10-CM | POA: Diagnosis not present

## 2024-03-05 DIAGNOSIS — E1142 Type 2 diabetes mellitus with diabetic polyneuropathy: Secondary | ICD-10-CM | POA: Diagnosis not present

## 2024-03-05 DIAGNOSIS — I2510 Atherosclerotic heart disease of native coronary artery without angina pectoris: Secondary | ICD-10-CM | POA: Diagnosis not present

## 2024-03-05 DIAGNOSIS — J439 Emphysema, unspecified: Secondary | ICD-10-CM | POA: Diagnosis not present

## 2024-03-05 DIAGNOSIS — Z6839 Body mass index (BMI) 39.0-39.9, adult: Secondary | ICD-10-CM | POA: Diagnosis not present

## 2024-03-05 DIAGNOSIS — E782 Mixed hyperlipidemia: Secondary | ICD-10-CM | POA: Diagnosis not present

## 2024-03-05 DIAGNOSIS — I509 Heart failure, unspecified: Secondary | ICD-10-CM | POA: Diagnosis not present

## 2024-03-05 DIAGNOSIS — I4891 Unspecified atrial fibrillation: Secondary | ICD-10-CM | POA: Diagnosis not present

## 2024-03-05 DIAGNOSIS — I10 Essential (primary) hypertension: Secondary | ICD-10-CM | POA: Diagnosis not present

## 2024-03-05 DIAGNOSIS — F329 Major depressive disorder, single episode, unspecified: Secondary | ICD-10-CM | POA: Diagnosis not present

## 2024-03-05 DIAGNOSIS — N183 Chronic kidney disease, stage 3 unspecified: Secondary | ICD-10-CM | POA: Diagnosis not present

## 2024-03-05 DIAGNOSIS — D631 Anemia in chronic kidney disease: Secondary | ICD-10-CM | POA: Diagnosis not present

## 2024-03-07 ENCOUNTER — Ambulatory Visit (INDEPENDENT_AMBULATORY_CARE_PROVIDER_SITE_OTHER): Payer: Medicare Other | Admitting: Podiatry

## 2024-03-07 ENCOUNTER — Encounter: Payer: Self-pay | Admitting: Podiatry

## 2024-03-07 DIAGNOSIS — Z794 Long term (current) use of insulin: Secondary | ICD-10-CM

## 2024-03-07 DIAGNOSIS — E1122 Type 2 diabetes mellitus with diabetic chronic kidney disease: Secondary | ICD-10-CM | POA: Diagnosis not present

## 2024-03-07 DIAGNOSIS — B351 Tinea unguium: Secondary | ICD-10-CM

## 2024-03-07 DIAGNOSIS — M79675 Pain in left toe(s): Secondary | ICD-10-CM

## 2024-03-07 DIAGNOSIS — M79674 Pain in right toe(s): Secondary | ICD-10-CM | POA: Diagnosis not present

## 2024-03-07 DIAGNOSIS — N183 Chronic kidney disease, stage 3 unspecified: Secondary | ICD-10-CM

## 2024-03-12 ENCOUNTER — Encounter: Payer: Self-pay | Admitting: Podiatry

## 2024-03-12 NOTE — Progress Notes (Signed)
  Subjective:  Patient ID: Jason Huang, male    DOB: Dec 04, 1950,  MRN: [id number]  Jason Huang presents to clinic today for preventative diabetic foot care for painful mycotic toenails x 10 which interfere with daily activities. Pain is relieved with periodic professional debridement.  Chief Complaint  Patient presents with   Diabetes    DFC IDDM A1C 9.2. LOV with PCP 03/05/24.   New problem(s): None.   PCP is [name], MD.  Allergies  Allergen Reactions   Penicillins Anaphylaxis   Sulfa Antibiotics Rash   Celebrex [Celecoxib] Rash    Headache    Vioxx [Rofecoxib] Rash    Review of Systems: Negative except as noted in the HPI.  Objective: No changes noted in today's physical examination. There were no vitals filed for this visit. Jason Huang is a pleasant 73 y.o. male obese in NAD. AAO x 3.  Vascular Examination: Capillary refill time immediate b/l. Palpable pedal pulses. Pedal hair present b/l. No pain with calf compression b/l. Skin temperature gradient WNL b/l. No cyanosis or clubbing b/l. No ischemia or gangrene noted b/l.   Neurological Examination: Sensation grossly intact b/l with 10 gram monofilament.   Dermatological Examination: Pedal skin with normal turgor, texture and tone b/l.  No open wounds. No interdigital macerations.   Toenails 1-5 b/l thick, discolored, elongated with subungual debris and pain on dorsal palpation.   No hyperkeratotic nor porokeratotic lesions present on today's visit.  Musculoskeletal Examination: Muscle strength 5/5 to all lower extremity muscle groups bilaterally. No pain, crepitus or joint limitation noted with ROM bilateral LE. No gross bony deformities bilaterally.  Radiographs: None  Assessment/Plan: 1. Pain due to onychomycosis of toenails of both feet   2. Type 2 diabetes mellitus with stage 3 chronic kidney disease, with long-term current use of insulin , unspecified whether stage 3a or 3b CKD (HCC)     Patient was evaluated and treated. All patient's and/or POA's questions/concerns addressed on today's visit. Mycotic toenails 1-5 debrided in length and girth without incident.  Treatment was provided by assistant Andrez Manchester under my supervision. Continue daily foot inspections and monitor blood glucose per PCP/Endocrinologist's recommendations. Continue soft, supportive shoe gear daily. Report any pedal injuries to medical professional. Call office if there are any quesitons/concerns.  Return in about 9 weeks (around 05/09/2024).  Delon LITTIE Merlin, DPM      [place] LOCATION: [address] [place] [address]                   Office [phone]   [facility] LOCATION: [address] [place], [place] [address] Office [phone]

## 2024-03-18 DIAGNOSIS — E1142 Type 2 diabetes mellitus with diabetic polyneuropathy: Secondary | ICD-10-CM | POA: Diagnosis not present

## 2024-03-18 DIAGNOSIS — E113291 Type 2 diabetes mellitus with mild nonproliferative diabetic retinopathy without macular edema, right eye: Secondary | ICD-10-CM | POA: Diagnosis not present

## 2024-03-18 DIAGNOSIS — N400 Enlarged prostate without lower urinary tract symptoms: Secondary | ICD-10-CM | POA: Diagnosis not present

## 2024-03-18 DIAGNOSIS — E1129 Type 2 diabetes mellitus with other diabetic kidney complication: Secondary | ICD-10-CM | POA: Diagnosis not present

## 2024-03-18 DIAGNOSIS — E782 Mixed hyperlipidemia: Secondary | ICD-10-CM | POA: Diagnosis not present

## 2024-03-18 DIAGNOSIS — I4891 Unspecified atrial fibrillation: Secondary | ICD-10-CM | POA: Diagnosis not present

## 2024-03-18 DIAGNOSIS — I739 Peripheral vascular disease, unspecified: Secondary | ICD-10-CM | POA: Diagnosis not present

## 2024-03-30 DIAGNOSIS — I739 Peripheral vascular disease, unspecified: Secondary | ICD-10-CM | POA: Diagnosis not present

## 2024-03-30 DIAGNOSIS — I4891 Unspecified atrial fibrillation: Secondary | ICD-10-CM | POA: Diagnosis not present

## 2024-03-30 DIAGNOSIS — E1129 Type 2 diabetes mellitus with other diabetic kidney complication: Secondary | ICD-10-CM | POA: Diagnosis not present

## 2024-03-30 DIAGNOSIS — E113291 Type 2 diabetes mellitus with mild nonproliferative diabetic retinopathy without macular edema, right eye: Secondary | ICD-10-CM | POA: Diagnosis not present

## 2024-04-17 DIAGNOSIS — E782 Mixed hyperlipidemia: Secondary | ICD-10-CM | POA: Diagnosis not present

## 2024-04-17 DIAGNOSIS — I4891 Unspecified atrial fibrillation: Secondary | ICD-10-CM | POA: Diagnosis not present

## 2024-04-17 DIAGNOSIS — E113291 Type 2 diabetes mellitus with mild nonproliferative diabetic retinopathy without macular edema, right eye: Secondary | ICD-10-CM | POA: Diagnosis not present

## 2024-04-17 DIAGNOSIS — I739 Peripheral vascular disease, unspecified: Secondary | ICD-10-CM | POA: Diagnosis not present

## 2024-04-17 DIAGNOSIS — N400 Enlarged prostate without lower urinary tract symptoms: Secondary | ICD-10-CM | POA: Diagnosis not present

## 2024-04-17 DIAGNOSIS — E1129 Type 2 diabetes mellitus with other diabetic kidney complication: Secondary | ICD-10-CM | POA: Diagnosis not present

## 2024-04-17 DIAGNOSIS — E1142 Type 2 diabetes mellitus with diabetic polyneuropathy: Secondary | ICD-10-CM | POA: Diagnosis not present

## 2024-04-29 DIAGNOSIS — E1129 Type 2 diabetes mellitus with other diabetic kidney complication: Secondary | ICD-10-CM | POA: Diagnosis not present

## 2024-04-29 DIAGNOSIS — E113291 Type 2 diabetes mellitus with mild nonproliferative diabetic retinopathy without macular edema, right eye: Secondary | ICD-10-CM | POA: Diagnosis not present

## 2024-04-29 DIAGNOSIS — I4891 Unspecified atrial fibrillation: Secondary | ICD-10-CM | POA: Diagnosis not present

## 2024-04-29 DIAGNOSIS — I739 Peripheral vascular disease, unspecified: Secondary | ICD-10-CM | POA: Diagnosis not present

## 2024-05-09 ENCOUNTER — Encounter: Payer: Self-pay | Admitting: Podiatry

## 2024-05-09 ENCOUNTER — Ambulatory Visit (INDEPENDENT_AMBULATORY_CARE_PROVIDER_SITE_OTHER): Admitting: Podiatry

## 2024-05-09 DIAGNOSIS — M79674 Pain in right toe(s): Secondary | ICD-10-CM | POA: Diagnosis not present

## 2024-05-09 DIAGNOSIS — Z794 Long term (current) use of insulin: Secondary | ICD-10-CM | POA: Diagnosis not present

## 2024-05-09 DIAGNOSIS — B351 Tinea unguium: Secondary | ICD-10-CM

## 2024-05-09 DIAGNOSIS — N183 Chronic kidney disease, stage 3 unspecified: Secondary | ICD-10-CM | POA: Diagnosis not present

## 2024-05-09 DIAGNOSIS — E1122 Type 2 diabetes mellitus with diabetic chronic kidney disease: Secondary | ICD-10-CM | POA: Diagnosis not present

## 2024-05-09 DIAGNOSIS — M79675 Pain in left toe(s): Secondary | ICD-10-CM | POA: Diagnosis not present

## 2024-05-14 ENCOUNTER — Other Ambulatory Visit: Payer: Self-pay | Admitting: Cardiology

## 2024-05-14 DIAGNOSIS — I4719 Other supraventricular tachycardia: Secondary | ICD-10-CM

## 2024-05-15 NOTE — Progress Notes (Signed)
  Subjective:  Patient ID: Jason Huang, male    DOB: 05-01-1951,  MRN: [id number]  Jason Huang presents to clinic today for at risk foot care. Pt has h/o NIDDM with chronic kidney disease and painful mycotic toenails of both feet that are difficult to trim. Pain interferes with daily activities and wearing enclosed shoe gear comfortably.   New problem(s): None.   PCP is [name], MD. Jason Huang 03/05/2024.  Allergies  Allergen Reactions   Penicillins Anaphylaxis   Sulfa Antibiotics Rash   Celebrex [Celecoxib] Rash    Headache    Vioxx [Rofecoxib] Rash    Review of Systems: Negative except as noted in the HPI.  Objective: No changes noted in today's physical examination. There were no vitals filed for this visit. Jason Huang is a pleasant 73 y.o. male obese in NAD. AAO x 3.  Vascular Examination: Capillary refill time immediate b/l. Vascular status intact b/l with palpable pedal pulses. Pedal hair present b/l. No pain with calf compression b/l. Skin temperature gradient WNL b/l. No cyanosis or clubbing b/l. No ischemia or gangrene noted b/l.   Neurological Examination: Sensation grossly intact b/l with 10 gram monofilament.   Dermatological Examination: Pedal skin with normal turgor, texture and tone b/l.  No open wounds. No interdigital macerations.   Toenails 1-5 b/l thick, discolored, elongated with subungual debris and pain on dorsal palpation.   No corns, calluses, nor porokeratotic lesions.  Musculoskeletal Examination: Muscle strength 5/5 to all lower extremity muscle groups bilaterally. No pain, crepitus or joint limitation noted with ROM bilateral LE. No gross bony deformities bilaterally.  Radiographs: None  Assessment/Plan: 1. Pain due to onychomycosis of toenails of both feet   2. Type 2 diabetes mellitus with stage 3 chronic kidney disease, with long-term current use of insulin , unspecified whether stage 3a or 3b CKD (HCC)   Consent given for  treatment. Patient examined. All patient's and/or POA's questions/concerns addressed on today's visit. Toenails 1-5 b/l debrided in length and girth without incident. Treatment was provided by assistant Andrez Manchester under my supervision. Continue foot and shoe inspections daily. Monitor blood glucose per PCP/Endocrinologist's recommendations. Continue soft, supportive shoe gear daily. Report any pedal injuries to medical professional. Call office if there are any questions/concerns. -Patient/POA to call should there be question/concern in the interim.   Return in about 9 weeks (around 07/11/2024).  Jason Huang, DPM      [place] LOCATION: [address] [place] [address]                   Office [phone]   [facility] LOCATION: [address] [place], [place] [address] Office [phone]

## 2024-05-18 DIAGNOSIS — E113291 Type 2 diabetes mellitus with mild nonproliferative diabetic retinopathy without macular edema, right eye: Secondary | ICD-10-CM | POA: Diagnosis not present

## 2024-05-18 DIAGNOSIS — E1129 Type 2 diabetes mellitus with other diabetic kidney complication: Secondary | ICD-10-CM | POA: Diagnosis not present

## 2024-05-18 DIAGNOSIS — Z794 Long term (current) use of insulin: Secondary | ICD-10-CM | POA: Diagnosis not present

## 2024-05-18 DIAGNOSIS — E1165 Type 2 diabetes mellitus with hyperglycemia: Secondary | ICD-10-CM | POA: Diagnosis not present

## 2024-05-18 DIAGNOSIS — I739 Peripheral vascular disease, unspecified: Secondary | ICD-10-CM | POA: Diagnosis not present

## 2024-05-18 DIAGNOSIS — E1142 Type 2 diabetes mellitus with diabetic polyneuropathy: Secondary | ICD-10-CM | POA: Diagnosis not present

## 2024-05-18 DIAGNOSIS — E1169 Type 2 diabetes mellitus with other specified complication: Secondary | ICD-10-CM | POA: Diagnosis not present

## 2024-05-18 DIAGNOSIS — E785 Hyperlipidemia, unspecified: Secondary | ICD-10-CM | POA: Diagnosis not present

## 2024-05-18 DIAGNOSIS — I10 Essential (primary) hypertension: Secondary | ICD-10-CM | POA: Diagnosis not present

## 2024-05-18 DIAGNOSIS — I4891 Unspecified atrial fibrillation: Secondary | ICD-10-CM | POA: Diagnosis not present

## 2024-05-18 DIAGNOSIS — N400 Enlarged prostate without lower urinary tract symptoms: Secondary | ICD-10-CM | POA: Diagnosis not present

## 2024-05-18 DIAGNOSIS — E782 Mixed hyperlipidemia: Secondary | ICD-10-CM | POA: Diagnosis not present

## 2024-05-22 DIAGNOSIS — D649 Anemia, unspecified: Secondary | ICD-10-CM | POA: Diagnosis not present

## 2024-05-22 DIAGNOSIS — Z23 Encounter for immunization: Secondary | ICD-10-CM | POA: Diagnosis not present

## 2024-05-22 DIAGNOSIS — I10 Essential (primary) hypertension: Secondary | ICD-10-CM | POA: Diagnosis not present

## 2024-05-22 DIAGNOSIS — N400 Enlarged prostate without lower urinary tract symptoms: Secondary | ICD-10-CM | POA: Diagnosis not present

## 2024-05-22 DIAGNOSIS — E782 Mixed hyperlipidemia: Secondary | ICD-10-CM | POA: Diagnosis not present

## 2024-05-22 DIAGNOSIS — F329 Major depressive disorder, single episode, unspecified: Secondary | ICD-10-CM | POA: Diagnosis not present

## 2024-05-22 DIAGNOSIS — I2721 Secondary pulmonary arterial hypertension: Secondary | ICD-10-CM | POA: Diagnosis not present

## 2024-05-22 DIAGNOSIS — N529 Male erectile dysfunction, unspecified: Secondary | ICD-10-CM | POA: Diagnosis not present

## 2024-05-22 DIAGNOSIS — E119 Type 2 diabetes mellitus without complications: Secondary | ICD-10-CM | POA: Diagnosis not present

## 2024-05-22 DIAGNOSIS — K219 Gastro-esophageal reflux disease without esophagitis: Secondary | ICD-10-CM | POA: Diagnosis not present

## 2024-05-22 DIAGNOSIS — E113299 Type 2 diabetes mellitus with mild nonproliferative diabetic retinopathy without macular edema, unspecified eye: Secondary | ICD-10-CM | POA: Diagnosis not present

## 2024-05-22 DIAGNOSIS — N189 Chronic kidney disease, unspecified: Secondary | ICD-10-CM | POA: Diagnosis not present

## 2024-05-29 DIAGNOSIS — E1129 Type 2 diabetes mellitus with other diabetic kidney complication: Secondary | ICD-10-CM | POA: Diagnosis not present

## 2024-05-29 DIAGNOSIS — I4891 Unspecified atrial fibrillation: Secondary | ICD-10-CM | POA: Diagnosis not present

## 2024-05-29 DIAGNOSIS — I739 Peripheral vascular disease, unspecified: Secondary | ICD-10-CM | POA: Diagnosis not present

## 2024-05-29 DIAGNOSIS — E113291 Type 2 diabetes mellitus with mild nonproliferative diabetic retinopathy without macular edema, right eye: Secondary | ICD-10-CM | POA: Diagnosis not present

## 2024-06-11 DIAGNOSIS — Z961 Presence of intraocular lens: Secondary | ICD-10-CM | POA: Diagnosis not present

## 2024-06-11 DIAGNOSIS — H5211 Myopia, right eye: Secondary | ICD-10-CM | POA: Diagnosis not present

## 2024-06-11 DIAGNOSIS — H5310 Unspecified subjective visual disturbances: Secondary | ICD-10-CM | POA: Diagnosis not present

## 2024-06-11 DIAGNOSIS — H5202 Hypermetropia, left eye: Secondary | ICD-10-CM | POA: Diagnosis not present

## 2024-06-13 DIAGNOSIS — G4733 Obstructive sleep apnea (adult) (pediatric): Secondary | ICD-10-CM | POA: Diagnosis not present

## 2024-06-26 DIAGNOSIS — H25042 Posterior subcapsular polar age-related cataract, left eye: Secondary | ICD-10-CM | POA: Diagnosis not present

## 2024-06-26 DIAGNOSIS — Z961 Presence of intraocular lens: Secondary | ICD-10-CM | POA: Diagnosis not present

## 2024-07-04 ENCOUNTER — Ambulatory Visit: Payer: Medicare Other | Admitting: Podiatry

## 2024-07-05 ENCOUNTER — Other Ambulatory Visit: Payer: Self-pay | Admitting: Cardiology

## 2024-07-05 DIAGNOSIS — I5033 Acute on chronic diastolic (congestive) heart failure: Secondary | ICD-10-CM

## 2024-07-24 ENCOUNTER — Encounter: Payer: Self-pay | Admitting: Podiatry

## 2024-07-24 ENCOUNTER — Ambulatory Visit (INDEPENDENT_AMBULATORY_CARE_PROVIDER_SITE_OTHER): Admitting: Podiatry

## 2024-07-24 DIAGNOSIS — E1122 Type 2 diabetes mellitus with diabetic chronic kidney disease: Secondary | ICD-10-CM | POA: Diagnosis not present

## 2024-07-24 DIAGNOSIS — B351 Tinea unguium: Secondary | ICD-10-CM | POA: Diagnosis not present

## 2024-07-24 DIAGNOSIS — Z794 Long term (current) use of insulin: Secondary | ICD-10-CM | POA: Diagnosis not present

## 2024-07-24 DIAGNOSIS — M79674 Pain in right toe(s): Secondary | ICD-10-CM

## 2024-07-24 DIAGNOSIS — N183 Chronic kidney disease, stage 3 unspecified: Secondary | ICD-10-CM

## 2024-07-24 DIAGNOSIS — M79675 Pain in left toe(s): Secondary | ICD-10-CM

## 2024-07-26 ENCOUNTER — Telehealth (HOSPITAL_BASED_OUTPATIENT_CLINIC_OR_DEPARTMENT_OTHER): Payer: Self-pay | Admitting: *Deleted

## 2024-07-26 NOTE — Telephone Encounter (Signed)
"  ° °  Pre-operative Risk Assessment    Patient Name: Jason Huang  DOB: [birth year] MRN: [id number]   Date of last office visit: 08/30/23 DR. LADONA Date of next office visit: NONE  Request for Surgical Clearance    Procedure:  3 DENTAL IMPLANTS TO BE PLACED  Date of Surgery:  Clearance TBD                                Surgeon:  DR. LONNI SAX, DDS Surgeon's Group or Practice Name: [facility] Phone number:  [phone] Fax number:  [phone]   Type of Clearance Requested:   - Medical  - Pharmacy:  [facility]  (Xarelto )     Type of Anesthesia:  Local    Additional requests/questions:    Jason Huang   07/26/2024, 9:16 AM   "

## 2024-07-29 ENCOUNTER — Encounter: Payer: Self-pay | Admitting: Podiatry

## 2024-07-29 NOTE — Progress Notes (Signed)
"  °  Subjective:  Patient ID: Jason Huang, male    DOB: 1951/03/28,  MRN: [id number]  Jason Huang presents to clinic today for at risk foot care. Pt has h/o NIDDM with chronic kidney disease and painful elongated mycotic toenails 1-5 bilaterally which are tender when wearing enclosed shoe gear. Pain is relieved with periodic professional debridement.  Chief Complaint  Patient presents with   [facility]    Rm16 Diabetic foot care/ Dr. Lamar Ng last visit November 2025/ Blood sugar 112    New problem(s): None.   PCP is Ng Lamar, MD.  Allergies[1]  Review of Systems: Negative except as noted in the HPI.  Objective: No changes noted in today's physical examination. There were no vitals filed for this visit. [name] is a pleasant 74 y.o. male obese in NAD. AAO x 3.  Vascular Examination: Capillary refill time immediate b/l. Vascular status intact b/l with palpable pedal pulses. Pedal hair present b/l. No pain with calf compression b/l. Skin temperature gradient WNL b/l. No cyanosis or clubbing b/l. No ischemia or gangrene noted b/l.   Neurological Examination: Sensation grossly intact b/l with 10 gram monofilament.   Dermatological Examination: Pedal skin with normal turgor, texture and tone b/l.  No open wounds. No interdigital macerations.   Toenails 1-5 b/l thick, discolored, elongated with subungual debris and pain on dorsal palpation.   No corns, calluses, nor porokeratotic lesions.  Musculoskeletal Examination: Muscle strength 5/5 to all lower extremity muscle groups bilaterally. No pain, crepitus or joint limitation noted with ROM bilateral LE. No gross bony deformities bilaterally.  Radiographs: None  Assessment/Plan: 1. Pain due to onychomycosis of toenails of both feet   2. Type 2 diabetes mellitus with stage 3 chronic kidney disease, with long-term current use of insulin , unspecified whether stage 3a or 3b CKD (HCC)   Patient was evaluated and  treated. All patient's and/or POA's questions/concerns addressed on today's visit. Mycotic toenails 1-5 b/l debrided in length and girth without incident.  Continue daily foot inspections and monitor blood glucose per PCP/Endocrinologist's recommendations.Continue soft, supportive shoe gear daily. Report any pedal injuries to medical professional. Call office if there are any quesitons/concerns. -Patient/POA to call should there be question/concern in the interim.   Return in about 9 weeks (around 09/25/2024).  Delon LITTIE Merlin, DPM      [facility] LOCATION: [address], [place] [address]                   Office [phone]   [facility] LOCATION: [address] [place], [place] [address] Office [phone]      [1]  Allergies Allergen Reactions   Penicillins Anaphylaxis   Sulfa Antibiotics Rash   Celebrex [Celecoxib] Rash    Headache    Vioxx [Rofecoxib] Rash   "

## 2024-07-31 NOTE — Telephone Encounter (Signed)
 Patient wants a call back regarding the status of his clearance to hold his medication.

## 2024-07-31 NOTE — Telephone Encounter (Signed)
 Please advise

## 2024-08-01 NOTE — Telephone Encounter (Signed)
 I s/w the DDS to see if they had a date for procedure. No date they are waiting for pt to be cleared first.

## 2024-08-01 NOTE — Telephone Encounter (Signed)
 Pharmacy, do you mind taking a look at this and providing recommendations for holding Xarelto ?  Thanks so much!

## 2024-08-01 NOTE — Telephone Encounter (Signed)
 Will send FYI to pharm-d.

## 2024-08-03 NOTE — Telephone Encounter (Signed)
Tried to call the pt to schedule a tele pre op appt, though no answer 

## 2024-08-03 NOTE — Telephone Encounter (Signed)
" ° °  Name: Jason Huang  DOB: 19-Jan-1951  MRN: [id number]  Primary Cardiologist: Gordy Bergamo, MD   Preoperative team, please contact this patient and set up a phone call appointment for further preoperative risk assessment. Please obtain consent and complete medication review. Thank you for your help.  I confirm that guidance regarding antiplatelet and oral anticoagulation therapy has been completed and, if necessary, noted below: - Per pharmacy and office protocol, patient can hold Xarelto  1 day prior to procedure.   I also confirmed the patient resides in the state of [place] . As per [facility] [employer] telemedicine laws, the patient must reside in the state in which the provider is licensed.   Shawnise Peterkin E Donnita Farina, PA-C 08/03/2024, 9:16 AM [facility]    "

## 2024-08-03 NOTE — Telephone Encounter (Signed)
 Patient with diagnosis of afib on Xarelto  for anticoagulation.    Procedure:  3 DENTAL IMPLANTS TO BE PLACED  Date of procedure: TBD   CHA2DS2-VASc Score = 4   This indicates a 4.8% annual risk of stroke. The patient's score is based upon: CHF History: 0 HTN History: 1 Diabetes History: 1 Stroke History: 0 Vascular Disease History: 1 Age Score: 1 Gender Score: 0      CrCl 43 ml/min Platelet count 159  Patient has not had an Afib/aflutter ablation in the last 3 months, DCCV within the last 4 weeks or a watchman implanted in the last 45 days   Per office protocol, patient can hold Xarelto  for 1 day prior to procedure.    **This guidance is not considered finalized until pre-operative APP has relayed final recommendations.**

## 2024-08-06 NOTE — Telephone Encounter (Addendum)
 Spoke to the patient and he is unsure when he will being doing his procedure due to the cost. He is also waiting to talk to the surgeon's office to get a plan. He states he is not in pain, but he will get back in touch with us  when he is ready to proceed with the procedure. For the time being, I will remove this note from our pre-op pool.

## 2024-08-16 ENCOUNTER — Other Ambulatory Visit: Payer: Self-pay | Admitting: Cardiology

## 2024-09-26 ENCOUNTER — Ambulatory Visit: Admitting: Podiatry

## 2024-11-28 ENCOUNTER — Ambulatory Visit: Admitting: Podiatry

## 2025-01-30 ENCOUNTER — Ambulatory Visit: Admitting: Podiatry
